# Patient Record
Sex: Male | Born: 1960 | Race: Black or African American | Hispanic: No | Marital: Married | Smoking: Former smoker
Health system: Southern US, Community
[De-identification: ages and names within clinical notes are randomized; demographics above are authoritative.]

## PROBLEM LIST (undated history)

## (undated) DIAGNOSIS — Z5189 Encounter for other specified aftercare: Secondary | ICD-10-CM

## (undated) DIAGNOSIS — D649 Anemia, unspecified: Secondary | ICD-10-CM

## (undated) DIAGNOSIS — I10 Essential (primary) hypertension: Secondary | ICD-10-CM

## (undated) DIAGNOSIS — G709 Myoneural disorder, unspecified: Secondary | ICD-10-CM

## (undated) DIAGNOSIS — A048 Other specified bacterial intestinal infections: Secondary | ICD-10-CM

## (undated) DIAGNOSIS — IMO0001 Reserved for inherently not codable concepts without codable children: Secondary | ICD-10-CM

## (undated) DIAGNOSIS — C884 Extranodal marginal zone B-cell lymphoma of mucosa-associated lymphoid tissue [MALT-lymphoma]: Secondary | ICD-10-CM

## (undated) DIAGNOSIS — K254 Chronic or unspecified gastric ulcer with hemorrhage: Secondary | ICD-10-CM

## (undated) HISTORY — PX: UPPER GASTROINTESTINAL ENDOSCOPY: SHX188

## (undated) HISTORY — DX: Anemia, unspecified: D64.9

## (undated) HISTORY — DX: Myoneural disorder, unspecified: G70.9

## (undated) HISTORY — DX: Chronic or unspecified gastric ulcer with hemorrhage: K25.4

## (undated) HISTORY — DX: Reserved for inherently not codable concepts without codable children: IMO0001

## (undated) HISTORY — DX: Encounter for other specified aftercare: Z51.89

## (undated) HISTORY — DX: Extranodal marginal zone B-cell lymphoma of mucosa-associated lymphoid tissue (MALT-lymphoma): C88.4

---

## 2003-11-07 ENCOUNTER — Emergency Department (HOSPITAL_COMMUNITY): Admission: EM | Admit: 2003-11-07 | Discharge: 2003-11-07 | Payer: Self-pay | Admitting: Family Medicine

## 2004-02-05 DIAGNOSIS — A048 Other specified bacterial intestinal infections: Secondary | ICD-10-CM

## 2004-02-05 DIAGNOSIS — C884 Extranodal marginal zone b-cell lymphoma of mucosa-associated lymphoid tissue (malt-lymphoma) not having achieved remission: Secondary | ICD-10-CM

## 2004-02-05 HISTORY — DX: Extranodal marginal zone b-cell lymphoma of mucosa-associated lymphoid tissue (malt-lymphoma) not having achieved remission: C88.40

## 2004-02-05 HISTORY — DX: Extranodal marginal zone B-cell lymphoma of mucosa-associated lymphoid tissue (MALT-lymphoma): C88.4

## 2004-02-05 HISTORY — DX: Other specified bacterial intestinal infections: A04.8

## 2005-02-02 ENCOUNTER — Inpatient Hospital Stay (HOSPITAL_COMMUNITY): Admission: EM | Admit: 2005-02-02 | Discharge: 2005-02-04 | Payer: Self-pay | Admitting: Emergency Medicine

## 2005-02-03 ENCOUNTER — Encounter (INDEPENDENT_AMBULATORY_CARE_PROVIDER_SITE_OTHER): Payer: Self-pay | Admitting: *Deleted

## 2005-02-03 ENCOUNTER — Ambulatory Visit: Payer: Self-pay | Admitting: Internal Medicine

## 2005-02-13 ENCOUNTER — Ambulatory Visit: Payer: Self-pay | Admitting: Hematology and Oncology

## 2005-02-13 ENCOUNTER — Ambulatory Visit: Payer: Self-pay | Admitting: Internal Medicine

## 2005-02-18 ENCOUNTER — Ambulatory Visit: Payer: Self-pay | Admitting: Cardiology

## 2005-03-12 ENCOUNTER — Ambulatory Visit: Payer: Self-pay | Admitting: Internal Medicine

## 2005-04-11 ENCOUNTER — Ambulatory Visit: Payer: Self-pay | Admitting: Hematology and Oncology

## 2006-01-15 ENCOUNTER — Ambulatory Visit: Payer: Self-pay | Admitting: Sports Medicine

## 2007-02-04 ENCOUNTER — Emergency Department (HOSPITAL_COMMUNITY): Admission: EM | Admit: 2007-02-04 | Discharge: 2007-02-04 | Payer: Self-pay | Admitting: Family Medicine

## 2008-06-17 ENCOUNTER — Emergency Department (HOSPITAL_COMMUNITY): Admission: EM | Admit: 2008-06-17 | Discharge: 2008-06-17 | Payer: Self-pay | Admitting: Emergency Medicine

## 2010-06-22 NOTE — Consult Note (Signed)
NAME:  Timothy Newton, Timothy Newton NO.:  [id number]   MEDICAL RECORD NO.:  [id number]          PATIENT TYPE:  INP   LOCATION:  5733                         FACILITY:  [facility]   PHYSICIAN:  Iva Boop, M.D. LHCDATE OF BIRTH:  [birth year]   DATE OF CONSULTATION:  02/02/2005  DATE OF DISCHARGE:                                   CONSULTATION   CHIEF COMPLAINT:  Melena.   REQUESTING PHYSICIAN:  Jetty Duhamel, M.D.   ASSESSMENT:  Melena consistent with upper GI bleed.  BUN and creatinine  ratio is elevated as high.  Hemoglobin is 9.3.  He has an acute blood loss  anemia.  His gastrointestinal bleeding is associated with orthostasis as  well, most likely peptic ulcer disease, although he is otherwise  asymptomatic.  AVMs or gastrointestinal neoplasm are possibilities.  He has  not had any other symptoms.   RECOMMENDATIONS/PLAN:  1.  Proceed with upper GI endoscopy tomorrow.  2.  Agree with proton pump inhibitor.  3.  It is reasonable to transfuse this patient even though his hemoglobin is      9.0 since he was orthostatic.  I suspect it will drop further.   I have explained the risk, benefits, and indications of upper GI endoscopy,  and he understands and agrees to proceed.   HISTORY:  This is a 49 year old African-American man with no significant  past medical history other than smoking.  Three to four days ago, he  developed melena with tarry-loose stools.  This persisted with a stool  everyday, and then today, he had one and felt weak, lightheaded and dizzy.  He had been drinking some mimosa.  His appetite is fine.  No weight loss.  No bowel habit changes.  There is no family history of colon cancer or other  contributing factors, including no NSAIDs.  He takes a multivitamin daily.   DRUG ALLERGIES:  None known.   FAMILY HISTORY:  Parents are both healthy.  No ulcer disease or colon  cancer.   SOCIAL HISTORY:  Social alcohol.  He does smoke one pack per day for  20  years plus.  He has been employed in [employer], currently unemployed.  He is  single.  He has a 90-year-old and a 17-year-old son that live with him.   REVIEW OF SYSTEMS:  Positive for eye glasses.  All other systems are  negative at this time.   PHYSICAL EXAMINATION:  GENERAL:  Reveals a pleasant, well-developed, middle-  aged black man, in no acute distress.  VITAL SIGNS:  Temperature 99, blood pressure 137/82, pulse 104-148, heart  rate 70-140 from supine to standing, respirations 18.  HEENT:  Eyes anicteric.  Mouth free of lesions.  NECK:  Supple, no mass.  CHEST:  Clear.  HEART:  S1 and S2.  No gallops.  ABDOMEN:  Soft, bowel sounds present, but not increased, nontender.  RECTAL EXAM:  Not repeated, having been done in the emergency room  confirming melena, per Dr. Vassie Moment history that I obtained from him.  EXTREMITIES:  No edema.  NEUROLOGIC:  He is alert and oriented x3.  LYMPH NODES:  No neck, supraclavicular, inguinal adenopathy.   LABORATORY DATA:  Hemoglobin 9.3, MVC 89, hematocrit 27, white count 11.6,  platelets 200,000.  Coagulations are normal with INR 1.2, PTT 27.  LFTs  normal.  BUN 24, creatinine 0.9, glucose 103, otherwise normal BMT.   I appreciate the opportunity to care for this patient.      Iva Boop, M.D. [facility]  Electronically Signed     CEG/MEDQ  D:  02/02/2005  T:  02/04/2005  Job:  [id number]   cc:   Lonia Blood, M.D.  Fax: [phone]

## 2010-06-22 NOTE — Discharge Summary (Signed)
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  DATE OF BIRTH:  03-02-1960   DATE OF ADMISSION:  02/02/2005  DATE OF DISCHARGE:  02/04/2005                                 DISCHARGE SUMMARY   DISCHARGE DIAGNOSES:  1.  Upper gastrointestinal bleed secondary to multiple gastric ulcers.  2.  Acute post hemorrhagic anemia requiring transfusion of 4 units packed      red blood cells.   DISCHARGE MEDICATIONS:  Protonix 40 mg p.o. b.i.d. for 2 weeks, then  Prilosec OTC 20 mg p.o. b.i.d. until seen by GI.   PROCEDURES:  1.  Upper GI endoscopy dated February 03, 2005, performed by Dr. Iva Boop, M.D., gastroenterologist.  This showed ulcer in posterior      stomach and body, also flat dark spots, not bleeding. Surrounding      mucosal elevation and thickening, firm with a suspicion of a neoplasm.  2.  Clean-based, benign appearing pyloric ulcer.  3.  Small sliding hiatal hernia.  Biopsies taken.   ADMISSION HISTORY:  Refer to H&P notes of February 02, 2005.  However, in  brief, this is a 50 year old male, with no significant past medical history,  who presents with passage of black tarry stools for 3 days, associated with  mild lightheadedness without any abdominal pain or hematemesis.  He was  admitted for further evaluation, investigation and management.   CLINICAL COURSE:  Upper GI bleed.  The patient presents with melena as well  as lightheadedness, was found to have a hemoglobin of 9.3 with an MCV of 90.  He was kept n.p.o., started on intravenous fluid hydration. Because he had a  tachycardia of 140-148, suggestive of hemodynamic instability, was  transfused 2 units of PRBCs.  Gastroenterology consultation was called,  which was kindly provided by Dr. Stan Head.  On February 03, 2005, repeat  hemoglobin was found to be 8.9.  The patient was therefore transfused with a  further 2 units of PRBCs, resulting in a post transfusion hemoglobin of 10.4  on February 04, 2005.  Patient underwent upper GI endoscopy performed by Dr.  Stan Head on February 03, 2005.  This showed an ulcer in the posterior  stomach and the body.  There were flat dark spots that were not bleeding and  also surrounding mucosal, elevation and thickening. Biopsies were taken.  There was also a clean-based, benign appearing, pyloric ulcer and a small  hiatal hernia.  At the time of this dictation, the pathology reports are  still pending.  As of a.m. of February 04, 2005, patient was totally  asymptomatic, hemodynamically stable, with a blood pressure of 123/61 mmHg,  and pulse rate of 76.  He had no acute symptoms overnight, he was  asymptomatic today and was keen to go.  Physical examination was quite  unremarkable.   DISPOSITION:  Patient was discharged in satisfactory condition on February 04, 2005.  He is recommended  to return to work on February 11, 2005.   DIET:  No restrictions.   ACTIVITY:  No restrictions.   WOUND CARE:  Not applicable.   MEDICATIONS:  Protonix 40 mg p.o. t.i.d. for 2 weeks and then Prilosec OTC  20 mg p.o. b.i.d.  Alternatively, he may continue Protonix 40 mg p.o. daily  after the initial course of 2 weeks, whichever is more financially suitable  for him.  He is recommended to continue on proton pump inhibitor treatment  until seen by Dr. Stan Head in 2-3 weeks.   FOLLOWUP INSTRUCTIONS:  Followup with Dr. Stan Head, GI, in 2-3 weeks.  Patient is to call for an appointment.  He has been supplied with the  appropriate telephone number, i.e. [phone].  He is also to followup  routinely with his P.M.D., Dr. Barbee Shropshire, in 1-2 weeks essentially for  followup of his hemoglobin level.   SPECIAL INSTRUCTIONS:  Patient has been instructed to avoid aspirin and  other NSAIDs.  He will  likely have followup endoscopy in 6-8 weeks.  All of  this has been communicated to patient, he has verbalized understanding.      Isidor Holts, M.D.  Electronically Signed     CO/MEDQ  D:  02/04/2005  T:  02/04/2005  Job:  [id number]   cc:   Iva Boop, M.D. [facility]  [address] [place], [place] [address]   Olene Craven, M.D.  Fax: [phone]

## 2010-06-22 NOTE — H&P (Signed)
NAME:  Timothy Newton, Timothy Newton NO.:  [id number]   MEDICAL RECORD NO.:  [id number]          PATIENT TYPE:  EMS   LOCATION:  MAJO                         FACILITY:  [facility]   PHYSICIAN:  Lonia Blood, M.D.DATE OF BIRTH:  [birth year]   DATE OF ADMISSION:  02/02/2005  DATE OF DISCHARGE:                                HISTORY & PHYSICAL   PRIMARY CARE PHYSICIAN:  Dr. Kern Reap.   CHIEF COMPLAINT:  Black tarry stool x3 days.   HISTORY OF PRESENT ILLNESS:  Timothy Newton is a very pleasant 50-  year-old gentleman with no significant medical history whatsoever. He has  been in his usual state of health until approximately 3 days ago when he had  one episode of black stool. He reported that it was primarily liquid runny  type stool. There was no accompanying abdominal or epigastric pain. The  patient had not had any recent change in his diet. The patient went the  remainder of the day without any symptoms. The following day he had an  additional black runny stool. Again no GI discomfort was suffered and no  further bowel movements were experienced. Today he had a fourth bowel  movement which was more formed. He began to develop significant nausea. He  noted that when he was in a sitting position and went to a standing position  he would break out in a sweat and he felt somewhat light headed. He did not  pass out. He has not had any hematemesis. There has been no vomiting  whatsoever. There continues to be a lack of abdominal pain. The patient has  had no prior history of similar complaints. He does not drink alcohol to  excess. He has had a mild headache today. There is no chest pain. There is  no shortness of breath. There has been no recent abdominal trauma. Appetite  has been normal up until the last 24 to 48 hours.   REVIEW OF SYSTEMS:  Comprehensive review of systems is unremarkable with  exception of history of present illness above.   PAST MEDICAL  HISTORY:  Tobacco abuse one pack per day since 50 years old.   MEDICATIONS:  No prescription medications. Denies use of nonsteroidals.   ALLERGIES:  No known drug allergies.   FAMILY HISTORY:  Mother is alive and healthy. Father is alive and healthy.  There is no history of GI cancer in the family.   SOCIAL HISTORY:  The patient does not drink alcohol to excess but does  occasionally partake of alcoholic beverages. He lives in [place]. He is  single. He has two healthy children. He is unemployed at present.   DATA REVIEWED:  Hemoglobin is 9.3 with an MCV of 90, white count is mildly  elevated at 11.6 thousand, platelet count is 200,000. Electrolytes are  balanced. BUN is 24 which is elevated, creatinine is 0.9. Serum glucose is  normal. Liver function tests are normal. Albumin is 3.5.   PHYSICAL EXAMINATION:  VITAL SIGNS:  Temperature 99.0, blood pressure  137/82, heart rate 104 to 148, respiratory rate 92% on room air. Heart rate  - orthostatic at 90  sitting and 140 standing.  GENERAL:  Well-developed, well-nourished male in no acute respiratory  distress.  HEENT:  Normocephalic, atraumatic. Pupils are equal, round and reactive to  light and accommodation. Extraocular muscles are intact bilaterally. OC/OP  clear.  NECK:  No JVD, no lymphadenopathy.  LUNGS:  Clear to auscultation bilaterally without wheezes or rhonchi.  CARDIOVASCULAR:  Regular rhythm but tachycardic at a approximately 110 beats  per minute without murmur, rub, or gallop.  ABDOMEN:  Nontender, nondistended, soft, bowel sounds present. No rebound,  no hepatosplenomegaly, no rebound, no ascites - abdominal exam is  unremarkable.  EXTREMITIES:  No significant clubbing, cyanosis or edema of bilateral lower  extremities.  NEUROLOGIC:  Neurologic exam is nonfocal.   IMPRESSION AND PLAN:  1.  Acute - subacute upper GI bleeding - symptoms are certainly most      consistent with upper GI bleed. This appears to be a  significant bleed      given the hemoglobin of 9.3 in an otherwise healthy male. MCV is normal      and therefore there is no suspicion of low baseline hemoglobin such as      with a chronic iron-deficiency anemia or a thalassemia.  The patient will be typed and crossed as detailed below. I will place him on  a proton pump inhibitor with 40 mg of Protonix IV q.12 hours. He has been  consulted to undergo  EGD.  1.  Acute blood loss anemia - the patient is suffering with significant      blood loss with a hemoglobin of 9.3. He has been typed and crossed and      will be transfused 2 units of packed red blood cells initially. I will      follow CBC q.6 hours and transfused further as necessary. Though on      would expect this patient to tolerate a hemoglobin at 9.3, I feel that      he is likely continuing to lose blood because of his orthostatic      symptoms and therefore I feel that transfusion is required. My goal is      to transfuse to approximately 10, though if he stabilizes between 8 and      10 without orthostatic symptoms then I feel that we can hold on further      transfusions. I am checking coags too as these have not been previously      checked.  2.  Tobacco abuse - I have advised the patient that he should discontinue      smoking. I have requested tobacco cessation consultation to support this      during his stay.      Lonia Blood, M.D.  Electronically Signed     JTM/MEDQ  D:  02/02/2005  T:  02/02/2005  Job:  [id number]   cc:   Olene Craven, M.D.  Fax: [phone]

## 2010-11-09 LAB — I-STAT 8, (EC8 V) (CONVERTED LAB)
Acid-Base Excess: 4 — ABNORMAL HIGH
BUN: 12
Bicarbonate: 31.7 — ABNORMAL HIGH
Chloride: 104
Glucose, Bld: 101 — ABNORMAL HIGH
HCT: 53 — ABNORMAL HIGH
Hemoglobin: 18 — ABNORMAL HIGH
Operator id: 235561
Potassium: 4.3
Sodium: 139
TCO2: 33
pCO2, Ven: 54.6 — ABNORMAL HIGH
pH, Ven: 7.371 — ABNORMAL HIGH

## 2010-11-09 LAB — POCT I-STAT CREATININE
Creatinine, Ser: 1.1
Operator id: 235561

## 2010-11-09 LAB — POCT URINALYSIS DIP (DEVICE)
Glucose, UA: NEGATIVE
Hgb urine dipstick: NEGATIVE
Ketones, ur: NEGATIVE
Nitrite: NEGATIVE
Operator id: 235561
Protein, ur: 100 — AB
Specific Gravity, Urine: 1.015
Urobilinogen, UA: 4 — ABNORMAL HIGH
pH: 8.5 — ABNORMAL HIGH

## 2011-03-17 ENCOUNTER — Encounter (HOSPITAL_COMMUNITY): Payer: Self-pay

## 2011-03-17 ENCOUNTER — Emergency Department (INDEPENDENT_AMBULATORY_CARE_PROVIDER_SITE_OTHER)
Admission: EM | Admit: 2011-03-17 | Discharge: 2011-03-17 | Disposition: A | Discharge status: Other Acute Inpatient Hospital | Payer: BC Managed Care – PPO | Source: Home / Self Care

## 2011-03-17 ENCOUNTER — Emergency Department (HOSPITAL_COMMUNITY)
Admission: EM | Admit: 2011-03-17 | Discharge: 2011-03-17 | Disposition: A | Discharge status: Home / Self Care | Payer: BC Managed Care – PPO | Attending: Emergency Medicine | Admitting: Emergency Medicine

## 2011-03-17 DIAGNOSIS — K922 Gastrointestinal hemorrhage, unspecified: Secondary | ICD-10-CM

## 2011-03-17 DIAGNOSIS — K921 Melena: Secondary | ICD-10-CM

## 2011-03-17 DIAGNOSIS — I10 Essential (primary) hypertension: Secondary | ICD-10-CM | POA: Insufficient documentation

## 2011-03-17 HISTORY — DX: Other specified bacterial intestinal infections: A04.8

## 2011-03-17 HISTORY — DX: Essential (primary) hypertension: I10

## 2011-03-17 LAB — COMPREHENSIVE METABOLIC PANEL
ALT: 15 U/L (ref 0–53)
AST: 13 U/L (ref 0–37)
Albumin: 3.7 g/dL (ref 3.5–5.2)
Alkaline Phosphatase: 53 U/L (ref 39–117)
BUN: 30 mg/dL — ABNORMAL HIGH (ref 6–23)
CO2: 29 mEq/L (ref 19–32)
Calcium: 10.9 mg/dL — ABNORMAL HIGH (ref 8.4–10.5)
Chloride: 103 mEq/L (ref 96–112)
Creatinine, Ser: 0.85 mg/dL (ref 0.50–1.35)
GFR calc Af Amer: >90 mL/min (ref >90)
GFR calc non Af Amer: >90 mL/min (ref >90)
Glucose, Bld: 113 mg/dL — ABNORMAL HIGH (ref 70–99)
Potassium: 4.7 mEq/L (ref 3.5–5.1)
Sodium: 138 mEq/L (ref 135–145)
Total Bilirubin: 1 mg/dL (ref 0.3–1.2)
Total Protein: 6.9 g/dL (ref 6.0–8.3)

## 2011-03-17 LAB — DIFFERENTIAL
Basophils Absolute: 0 10*3/uL (ref 0.0–0.1)
Basophils Relative: 0 % (ref 0–1)
Eosinophils Absolute: 0 10*3/uL (ref 0.0–0.7)
Eosinophils Relative: 0 % (ref 0–5)
Lymphocytes Relative: 19 % (ref 12–46)
Lymphs Abs: 1.7 10*3/uL (ref 0.7–4.0)
Monocytes Absolute: 0.3 10*3/uL (ref 0.1–1.0)
Monocytes Relative: 3 % (ref 3–12)
Neutro Abs: 7 10*3/uL (ref 1.7–7.7)
Neutrophils Relative %: 77 % (ref 43–77)

## 2011-03-17 LAB — CBC
HCT: 38.7 % — ABNORMAL LOW (ref 39.0–52.0)
Hemoglobin: 12.7 g/dL — ABNORMAL LOW (ref 13.0–17.0)
MCH: 30.2 pg (ref 26.0–34.0)
MCHC: 32.8 g/dL (ref 30.0–36.0)
MCV: 92.1 fL (ref 78.0–100.0)
Platelets: 218 10*3/uL (ref 150–400)
RBC: 4.2 MIL/uL — ABNORMAL LOW (ref 4.22–5.81)
RDW: 12.1 % (ref 11.5–15.5)
WBC: 9.1 10*3/uL (ref 4.0–10.5)

## 2011-03-17 LAB — PROTIME-INR
INR: 1.11 (ref 0.00–1.49)
Prothrombin Time: 14.5 seconds (ref 11.6–15.2)

## 2011-03-17 LAB — APTT: aPTT: 27 seconds (ref 24–37)

## 2011-03-17 LAB — OCCULT BLOOD, POC DEVICE: Fecal Occult Bld: POSITIVE

## 2011-03-17 MED ORDER — LANSOPRAZOLE 15 MG PO CPDR: 15.0000 mg | DELAYED_RELEASE_CAPSULE | Freq: Every day | ORAL | Status: DC

## 2011-03-17 NOTE — ED Notes (Signed)
Called security for pt to get transportation back to urgent care to receive his car

## 2011-03-17 NOTE — ED Notes (Signed)
Pt states he woke at 3am today and had diarrhea which was black, he has since had two more black stools today.  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

## 2011-03-17 NOTE — ED Notes (Signed)
Pt reports that in 2007 he was diagnosed with a bleeding ulcer.  Pt reports at that time he had bright red blood in stools.

## 2011-03-17 NOTE — ED Provider Notes (Signed)
Medical screening examination/treatment/procedure(s) were performed by non-physician practitioner and as supervising physician I was immediately available for consultation/collaboration.   [facility]; MD   Sharin Grave, MD 03/17/11 1755

## 2011-03-17 NOTE — ED Notes (Signed)
Reports black tarry stool onset this am at 0300 sts checked at [facility] and was bright red. sts loose stools

## 2011-03-17 NOTE — ED Provider Notes (Addendum)
History     CSN: [id number]  Arrival date & time 03/17/11  1316   First MD Initiated Contact with Patient 03/17/11 1437      Chief Complaint  Patient presents with  . Rectal Bleeding    (Consider location/radiation/quality/duration/timing/severity/associated sxs/prior treatment) Patient is a 51 y.o. male presenting with hematochezia. The history is provided by the patient.  Rectal Bleeding  Pertinent negatives include no abdominal pain, no diarrhea, no nausea, no vomiting, no chest pain, no headaches and no rash.   patient's had 3 episodes of black stool which is more like diarrhea since 3 AM this morning. He states he feels good otherwise. No other bleeding. No dizziness. No abdominal pain. 8 years ago he had similar symptoms and was diagnosed with an ulcer. He was H. pylori positive and had a "mild cancer". He states he followed up but nothing came of it. No weight loss. He does not smoke. No other bleeding. 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 He was seen in urgent care and had a guaiac test done that was positive. He was sent here for further evaluation  Past Medical History  Diagnosis Date  . Hypertension   . H. pylori infection   . Bleeding ulcer     History reviewed. No pertinent past surgical history.  History reviewed. No pertinent family history.  History  Substance Use Topics  . Smoking status: Never Smoker   . Smokeless tobacco: Not on file  . Alcohol Use: No      Review of Systems  Constitutional: Negative for activity change and appetite change.  HENT: Negative for neck stiffness.   Eyes: Negative for pain.  Respiratory: Negative for chest tightness and shortness of breath.   Cardiovascular: Negative for chest pain and leg swelling.  Gastrointestinal: Positive for blood in stool and hematochezia. Negative for nausea, vomiting, abdominal pain and diarrhea.  Genitourinary: Negative for flank pain.  Musculoskeletal:  Negative for back pain.  Skin: Negative for rash.  Neurological: Negative for weakness, numbness and headaches.  Psychiatric/Behavioral: Negative for behavioral problems.    Allergies  Review of patient's allergies indicates no known allergies.  Home Medications   Current Outpatient Rx  Name Route Sig Dispense Refill  . LISINOPRIL 10 MG PO TABS Oral Take 10 mg by mouth daily.    Carma Leaven M PLUS PO TABS Oral Take 1 tablet by mouth daily.    Marland Kitchen LANSOPRAZOLE 15 MG PO CPDR Oral Take 1 capsule (15 mg total) by mouth daily. 14 capsule 0    BP 127/68  Pulse 105  Temp(Src) 98.4 F (36.9 C) (Oral)  Resp 20  SpO2 96%  Physical Exam  Nursing note and vitals reviewed. Constitutional: He is oriented to person, place, and time. He appears well-developed and well-nourished.  HENT:  Head: Normocephalic and atraumatic.  Eyes: EOM are normal. Pupils are equal, round, and reactive to light.  Neck: Normal range of motion. Neck supple.  Cardiovascular: Normal rate, regular rhythm and normal heart sounds.   No murmur heard. Pulmonary/Chest: Effort normal and breath sounds normal.  Abdominal: Soft. Bowel sounds are normal. He exhibits no distension and no mass. There is no tenderness. There is no rebound and no guarding.  Musculoskeletal: Normal range of motion. He exhibits no edema.  Neurological: He is alert and oriented to person, place, and time. No cranial nerve deficit.  Skin: Skin is warm and dry. No rash noted. No pallor.  Psychiatric:  He has a normal mood and affect.    ED Course  Procedures (including critical care time)  Labs Reviewed  CBC - Abnormal; Notable for the following:    RBC 4.20 (*)    Hemoglobin 12.7 (*)    HCT 38.7 (*)    All other components within normal limits  COMPREHENSIVE METABOLIC PANEL - Abnormal; Notable for the following:    Glucose, Bld 113 (*)    BUN 30 (*)    Calcium 10.9 (*)    All other components within normal limits  DIFFERENTIAL  PROTIME-INR    APTT   No results found.   1. GI bleed       MDM  GI bleed. History same in 2007. Head the bleeding ulcer at that time. Initial hemoglobin is slightly below normal. Patient states he feels good.his initial hemoglobin is slightly low. He is not willing to stay for recheck. Is not hypotensive. He states will call Dr. Elenore Paddy tomorrow and will return tonight if he feels worse     Juliet Rude. Rubin Payor, MD 03/17/11 1544  Juliet Rude. Rubin Payor, MD 03/17/11 [phone]

## 2011-03-17 NOTE — ED Provider Notes (Signed)
History     CSN: [id number]  Arrival date & time 03/17/11  1057   None     Chief Complaint  Patient presents with  . Melena    (Consider location/radiation/quality/duration/timing/severity/associated sxs/prior treatment) HPI Comments: Timothy Newton presents stating that he awoke at 3 AM this morning needing to have a bowel movement. He had a loose black stool. He went back to sleep and awoke again at 6 or 7 AM and again had another loose black stool. About 10 AM he at breakfast and about 15 minutes later had a third loose black stool. He denies any abdominal pain. Until this morning his appetite and bowels have been normal. 02/03/2005 he was admitted with a GI bleed. At that time he states he had sharp abdominal pain and bright red rectal bleeding. He had an EGD performed and states he was told he had H Pylori infection and mild cancer but "not cancer." He states he was treated "with the purple pill" and had no further follow up.    Past Medical History  Diagnosis Date  . Hypertension   . H. pylori infection     History reviewed. No pertinent past surgical history.  History reviewed. No pertinent family history.  History  Substance Use Topics  . Smoking status: Never Smoker   . Smokeless tobacco: Not on file  . Alcohol Use: No      Review of Systems  Constitutional: Negative for fever, chills and appetite change.  Gastrointestinal: Negative for nausea, vomiting, abdominal pain, diarrhea and constipation.    Allergies  Review of patient's allergies indicates no known allergies.  Home Medications   Current Outpatient Rx  Name Route Sig Dispense Refill  . PRESCRIPTION MEDICATION  B/p pill? And one a day vitamins      BP 111/64  Pulse 74  Temp(Src) 98.1 F (36.7 C) (Oral)  Resp 18  SpO2 100%  Physical Exam  Nursing note and vitals reviewed. Constitutional: He appears well-developed and well-nourished. No distress.  HENT:  Head: Normocephalic and atraumatic.    Right Ear: Tympanic membrane, external ear and ear canal normal.  Left Ear: Tympanic membrane, external ear and ear canal normal.  Nose: Nose normal.  Mouth/Throat: Uvula is midline, oropharynx is clear and moist and mucous membranes are normal. No oropharyngeal exudate, posterior oropharyngeal edema or posterior oropharyngeal erythema.  Neck: Neck supple.  Cardiovascular: Normal rate, regular rhythm and normal heart sounds.   Pulmonary/Chest: Effort normal and breath sounds normal. No respiratory distress.  Abdominal: Normal appearance and bowel sounds are normal. He exhibits no mass. There is no hepatosplenomegaly. There is no tenderness. There is no CVA tenderness.  Genitourinary: Rectal exam shows no external hemorrhoid, no fissure, no mass, no tenderness and anal tone normal. Guaiac positive stool.  Lymphadenopathy:    He has no cervical adenopathy.  Neurological: He is alert.  Skin: Skin is warm and dry.  Psychiatric: He has a normal mood and affect.    ED Course  Procedures (including critical care time)  Labs Reviewed - No data to display No results found.   1. Melena       MDM  Pt transferred to [facility] for further evaluation. Previous records reviewed. Hx of admission 02-04-11 for GI bleed.  Pathology report showed GI lymphoma. Pt was treated for his H Pylori infection and has not returned for f/u.         Melody Comas, [place] 03/17/11 [phone]

## 2011-03-18 ENCOUNTER — Telehealth: Payer: Self-pay | Admitting: Internal Medicine

## 2011-03-18 NOTE — Telephone Encounter (Signed)
Patient will come in and see Willette Cluster RNP tomorrow at 9:00.  His symptoms are unchanged from the ER.  He feels the same way he did when he left the ER yesterday.  He has black stools no frank bleeding.  He is asked to return to the ER if his symptoms worsen or new symptoms.

## 2011-03-19 ENCOUNTER — Encounter: Payer: Self-pay | Admitting: Nurse Practitioner

## 2011-03-19 ENCOUNTER — Other Ambulatory Visit (INDEPENDENT_AMBULATORY_CARE_PROVIDER_SITE_OTHER): Payer: BC Managed Care – PPO

## 2011-03-19 ENCOUNTER — Ambulatory Visit (INDEPENDENT_AMBULATORY_CARE_PROVIDER_SITE_OTHER): Payer: BC Managed Care – PPO | Admitting: Nurse Practitioner

## 2011-03-19 VITALS — BP 110/60 | HR 106 | Ht 73.0 in | Wt 242.6 lb

## 2011-03-19 DIAGNOSIS — K921 Melena: Secondary | ICD-10-CM

## 2011-03-19 LAB — COMPREHENSIVE METABOLIC PANEL
ALT: 16 U/L (ref 0–53)
AST: 17 U/L (ref 0–37)
Albumin: 3.6 g/dL (ref 3.5–5.2)
Alkaline Phosphatase: 43 U/L (ref 39–117)
BUN: 22 mg/dL (ref 6–23)
CO2: 28 mEq/L (ref 19–32)
Calcium: 9.1 mg/dL (ref 8.4–10.5)
Chloride: 103 mEq/L (ref 96–112)
Creatinine, Ser: 0.9 mg/dL (ref 0.4–1.5)
GFR: 116.66 mL/min (ref >60.00)
Glucose, Bld: 91 mg/dL (ref 70–99)
Potassium: 3.7 mEq/L (ref 3.5–5.1)
Sodium: 137 mEq/L (ref 135–145)
Total Bilirubin: 0.6 mg/dL (ref 0.3–1.2)
Total Protein: 6.3 g/dL (ref 6.0–8.3)

## 2011-03-19 LAB — CBC WITH DIFFERENTIAL/PLATELET
Basophils Absolute: 0.1 10*3/uL (ref 0.0–0.1)
Basophils Relative: 0.4 % (ref 0.0–3.0)
Eosinophils Absolute: 0.1 10*3/uL (ref 0.0–0.7)
Eosinophils Relative: 0.8 % (ref 0.0–5.0)
HCT: 26.2 % — ABNORMAL LOW (ref 39.0–52.0)
Hemoglobin: 8.8 g/dL — ABNORMAL LOW (ref 13.0–17.0)
Lymphocytes Relative: 27.7 % (ref 12.0–46.0)
Lymphs Abs: 3.3 10*3/uL (ref 0.7–4.0)
MCHC: 33.6 g/dL (ref 30.0–36.0)
MCV: 90.9 fl (ref 78.0–100.0)
Monocytes Absolute: 0.9 10*3/uL (ref 0.1–1.0)
Monocytes Relative: 7.3 % (ref 3.0–12.0)
Neutro Abs: 7.6 10*3/uL (ref 1.4–7.7)
Neutrophils Relative %: 63.8 % (ref 43.0–77.0)
Platelets: 206 10*3/uL (ref 150.0–400.0)
RBC: 2.88 Mil/uL — ABNORMAL LOW (ref 4.22–5.81)
RDW: 12.6 % (ref 11.5–14.6)
WBC: 12 10*3/uL — ABNORMAL HIGH (ref 4.5–10.5)

## 2011-03-19 NOTE — Patient Instructions (Addendum)
We have scheduled the Endoscopy for Thursday 03-21-2011.   Directions provided.  We have schedueld the blood transfusion for tomorrow 03-20-2011 at [facility] Short Stay beside the ER department. Arrive at 8:00 AM. Be sure to have breakfast and drink water to stay hydrated before arriving.  Take the Prevacid twice daily.   We will fax the work note to your place of employment per your request.

## 2011-03-19 NOTE — Progress Notes (Signed)
03/19/2011 Timothy Newton [id number] 17-Apr-1960   HISTORY OF PRESENT ILLNESS: Patient is a 51 year old male known to Dr. 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 EGD at that time also showed a clean-based benign-appearing pyloric ulcer. His CTscan was negative for metastatic disease. Patient was started on a PPI, scheduled for repeat EGD with EUS and referred to oncology. I have oncology's initial consultation note but no follow up notes.  As far as I can tell patient required no treatment other than eradication of H. Pylori. He declined EUS and hasn't been seen here since. Two days ago patient began having black stools for which he was evaluated in ED. Hemoglobin in ED was 12.7,  white count was normal at 9.1, BUN normal at 22. Patient does not take any anti-inflammatories. He has no abdominal pain, nausea or weight loss. He does admit to feeling slightly dizzy. Heart rate now is 110.    Past Medical History  Diagnosis Date  . Hypertension   . H. pylori infection PUD Gastric MALT lymphoma 2006      reports that he quit smoking about 10 years ago. He has never used smokeless tobacco. He reports that he does not drink alcohol or use illicit drugs. family history is not on file. No Known Allergies    Outpatient Encounter Prescriptions as of 03/19/2011  Medication Sig Dispense Refill  . lansoprazole (PREVACID) 15 MG capsule Take 1 capsule (15 mg total) by mouth daily.  14 capsule  0  . lisinopril (PRINIVIL,ZESTRIL) 10 MG tablet Take 10 mg by mouth daily.      . Multiple Vitamins-Minerals (MULTIVITAMINS THER. W/MINERALS) TABS Take 1 tablet by mouth daily.         REVIEW OF SYSTEMS  : All other systems reviewed and negative except where noted in the History of Present Illness.  PHYSICAL EXAM: BP 110/60  Pulse 106  Ht 6\' 1"  (1.854 m)  Wt 110.043 kg (242 lb 9.6 oz)  BMI 32.01 kg/m2 General: Well developed black male in  no acute distress Head: Normocephalic and atraumatic Eyes:  sclerae anicteric,conjunctive pink. Ears: Normal auditory acuity Mouth: No deformity or lesions Neck: Supple, no masses.  Lungs: Respirations even, unlabored, Clear throughout to auscultation Heart: Slightly tachycardic at 110 no murmurs heard Abdomen: Soft, non distended, nontender. No masses or hepatomegaly noted. Normal Bowel sounds Rectal: liquid melenic stool in vault.  Musculoskeletal: Symmetrical with no gross deformities  Skin: No lesions on visible extremities Extremities: No edema or deformities noted Neurological: Alert oriented x 4, grossly nonfocal Cervical Nodes:  No significant cervical adenopathy Psychological:  Alert and cooperative. Normal mood and affect  ASSESSMENT AND PLAN;  105. 51 year old black male with history of gastric MALT lymphoma diagnosed late 2006. I do not have all of oncology's records but it appears patient did not require any treatment (other than H. pylori eradication) as the CT scan was negative for metastatic disease. Endoscopic ultrasound was recommended at that time, patient declined and has not been seen here since. See #2 2. Two day history of melena associated with dizziness. Hemoglobin in ED two days ago was 12.7. Patient still having melenic stools, suspect hemoglobin has significantly declined. Patient is a runner, his blood pressure is okay but suspect  heart rate of 110 represent significant blood loss .I recommended hospital admission for active bleeding but patient is reluctant as he is a single parent with young children. Will obtain stat labs and plan  for outpatient upper endoscopy as soon as possible.  If hemoglobin critically low, as I am concerned it will, patient will likely need a blood transfusion. We will start him on a proton pump inhibitor to be taken twice daily.   Addendum: Stat labs reviewed. Hemoglobin is 8.8, down from 12.7 two days ago, WBC is 12. We have arranged for  2 units of PRBC to be transfused tomorrow. He will have an EGD on Thursday with Dr. Rhea Belton (Dr. Marvell Fuller schedule is full). The benefits, risks, and potential complications of EGD with possible biopsies were discussed with the patient and he agrees to proceed. I strongly advised the patient to return to the ED should he have increasing dizziness or development of shortness of breath or chest pain.

## 2011-03-20 ENCOUNTER — Other Ambulatory Visit: Payer: BC Managed Care – PPO | Admitting: Internal Medicine

## 2011-03-20 ENCOUNTER — Encounter (HOSPITAL_COMMUNITY)
Admission: RE | Admit: 2011-03-20 | Discharge: 2011-03-20 | Disposition: A | Discharge status: Home / Self Care | Payer: BC Managed Care – PPO | Source: Ambulatory Visit | Attending: Internal Medicine | Admitting: Internal Medicine

## 2011-03-20 DIAGNOSIS — D649 Anemia, unspecified: Secondary | ICD-10-CM | POA: Insufficient documentation

## 2011-03-20 DIAGNOSIS — K921 Melena: Secondary | ICD-10-CM | POA: Insufficient documentation

## 2011-03-20 LAB — PREPARE RBC (CROSSMATCH)

## 2011-03-20 MED ORDER — ACETAMINOPHEN 325 MG PO TABS
650.0000 mg | ORAL_TABLET | Freq: Once | ORAL | Status: AC
  Administered 2011-03-20: 650 mg via ORAL
  Filled 2011-03-20: qty 2

## 2011-03-20 NOTE — Progress Notes (Signed)
1015 Blood turned up to 141ml/ht

## 2011-03-20 NOTE — Progress Notes (Signed)
Reviewed DB 

## 2011-03-21 ENCOUNTER — Other Ambulatory Visit: Payer: Self-pay

## 2011-03-21 ENCOUNTER — Encounter: Payer: Self-pay | Admitting: Internal Medicine

## 2011-03-21 ENCOUNTER — Ambulatory Visit (AMBULATORY_SURGERY_CENTER): Payer: BC Managed Care – PPO | Admitting: Internal Medicine

## 2011-03-21 DIAGNOSIS — K259 Gastric ulcer, unspecified as acute or chronic, without hemorrhage or perforation: Secondary | ICD-10-CM

## 2011-03-21 DIAGNOSIS — C8589 Other specified types of non-Hodgkin lymphoma, extranodal and solid organ sites: Secondary | ICD-10-CM

## 2011-03-21 DIAGNOSIS — K921 Melena: Secondary | ICD-10-CM

## 2011-03-21 DIAGNOSIS — K2960 Other gastritis without bleeding: Secondary | ICD-10-CM

## 2011-03-21 DIAGNOSIS — D649 Anemia, unspecified: Secondary | ICD-10-CM

## 2011-03-21 DIAGNOSIS — C884 Extranodal marginal zone B-cell lymphoma of mucosa-associated lymphoid tissue [MALT-lymphoma]: Secondary | ICD-10-CM

## 2011-03-21 LAB — TYPE AND SCREEN
ABO/RH(D): A POS
Antibody Screen: NEGATIVE
Unit division: 0
Unit division: 0

## 2011-03-21 MED ORDER — SODIUM CHLORIDE 0.9 % IV SOLN: 500.0000 mL | INTRAVENOUS | Status: DC

## 2011-03-21 MED ORDER — LANSOPRAZOLE 30 MG PO CPDR: 30.0000 mg | DELAYED_RELEASE_CAPSULE | Freq: Two times a day (BID) | ORAL | Status: DC

## 2011-03-21 NOTE — Op Note (Signed)
[facility] [address] [place], [place]  [address]  ENDOSCOPY PROCEDURE REPORT  PATIENT:  Timothy Newton, Timothy Newton  MR#:  [id number] BIRTHDATE:  1960/09/11, 50 yrs. old  GENDER:  male ENDOSCOPIST:  Carie Caddy. Malvern Kadlec, MD Referred by:  Iva Boop, M.D., [facility] PROCEDURE DATE:  03/21/2011 PROCEDURE:  EGD for control of bleeding, EGD with biopsy, 43239 ASA CLASS:  Class III INDICATIONS:  melena, anemia MEDICATIONS:   These medications were titrated to patient response per physician's verbal order, Versed 14 mg IV, Fentanyl 150 mcg IV TOPICAL ANESTHETIC:  Cetacaine Spray  DESCRIPTION OF PROCEDURE:   After the risks benefits and alternatives of the procedure were thoroughly explained, informed consent was obtained.  The LB GIF-H180 [id number] endoscope was introduced through the mouth and advanced to the second portion of the duodenum, without limitations.  The instrument was slowly withdrawn as the mucosa was fully examined. <<PROCEDUREIMAGES>>  The esophagus and gastroesophageal junction were completely normal in appearance.  An ulcer with visible vessel was found in the fundus. Epinephrine (1:10,000)  injection was performed around the ulcer, then 2 endoscopic clips were placed with success.  Nodular mucosa was found in the fundus, cardia and gastric body. Multiple biopsies were obtained including around the ulcer itself, and sent to pathology.  The duodenal bulb was normal in appearance, as was the postbulbar duodenum.    Retroflexed views revealed findings as previously described.    The scope was then withdrawn from the patient and the procedure completed.  COMPLICATIONS:  None  ENDOSCOPIC IMPRESSION: 1) Normal esophagus 2) Ulcer in the fundus.  Injected with epinephrine and 2 hemostatic clips placed with success. 3) Nodular mucosa in the fundus, cardia and gastric body. Multiple biopsies performed. 4) Normal duodenum  RECOMMENDATIONS: 1) Await pathology results 2)  Avoid NSAIDs 3) Increase lansoprazole to 30 mg twice daily. 4) Follow up with either Mrs. Wilmon Pali or Dr. Leone Payor next week for reassessment and Hgb check.  Carie Caddy. Rhea Belton, MD  CC:  Stan Head, MD Willette Cluster ACNP-BC The Patient  n. eSIGNED:   Carie Caddy. Shakayla Hickox at 03/21/2011 12:22 PM  Enriqueta Shutter, [phone]

## 2011-03-21 NOTE — Progress Notes (Signed)
Patient did not experience any of the following events: a burn prior to discharge; a fall within the facility; wrong site/side/patient/procedure/implant event; or a hospital transfer or hospital admission upon discharge from the facility. (G8907) Patient did not have preoperative order for IV antibiotic SSI prophylaxis. (G8918)  

## 2011-03-21 NOTE — Patient Instructions (Signed)
FOLLOW DISCHARGE INSTRUCTIONS (BLUE & GREEN SHEETS).   NO ASPIRIN PRODUCTS OR ANTI-INFLAMMATORY MEDICATIONS   INCREASE IANSOPRAZOLE TO 30 MG TWICE DAILY ( SENT TO YOUR PHARMACY )  RECHECK HGB ( BLOOD COUNT ) NEXT WEEK FOLLOWING UP WITH PAULA GUENTHER AND OR DR. Leone Payor.

## 2011-03-21 NOTE — Progress Notes (Signed)
Patient needs CBCD tomorrow and appt with Dr Leone Payor for next week per Dr Rhea Belton.  I have scheduled him for an appt with Dr Leone Payor for 03/26/11.  He is aware of both appts

## 2011-03-22 ENCOUNTER — Telehealth: Payer: Self-pay | Admitting: *Deleted

## 2011-03-22 ENCOUNTER — Encounter: Payer: Self-pay | Admitting: Internal Medicine

## 2011-03-22 ENCOUNTER — Other Ambulatory Visit (INDEPENDENT_AMBULATORY_CARE_PROVIDER_SITE_OTHER): Payer: BC Managed Care – PPO

## 2011-03-22 DIAGNOSIS — D649 Anemia, unspecified: Secondary | ICD-10-CM

## 2011-03-22 LAB — CBC WITH DIFFERENTIAL/PLATELET
Basophils Absolute: 0 10*3/uL (ref 0.0–0.1)
Eosinophils Absolute: 0.3 10*3/uL (ref 0.0–0.7)
Lymphocytes Relative: 20 % (ref 12.0–46.0)
Lymphs Abs: 2.3 10*3/uL (ref 0.7–4.0)
MCHC: 33.2 g/dL (ref 30.0–36.0)
Monocytes Relative: 5.9 % (ref 3.0–12.0)
Neutro Abs: 8.3 10*3/uL — ABNORMAL HIGH (ref 1.4–7.7)
Platelets: 230 10*3/uL (ref 150.0–400.0)
RDW: 13.2 % (ref 11.5–14.6)

## 2011-03-22 NOTE — Telephone Encounter (Signed)
  Follow up Call-  Call back number 03/21/2011  Post procedure Call Back phone  # [phone], CELL [phone]  Permission to leave phone message Yes     Patient questions:  Do you have a fever, pain , or abdominal swelling? no Pain Score  0 *  Have you tolerated food without any problems? yes  Have you been able to return to your normal activities? yes  Do you have any questions about your discharge instructions: Diet   no Medications  no Follow up visit  no  Do you have questions or concerns about your Care? no  Actions: * If pain score is 4 or above: No action needed, pain <4. PATIENT QUESTIONING WHETHER SOMEONE CALLED YESTERDAY REGARDING BLOOD DRAW TODAY. INFORMED NO DOCUMENTED TELEPHONE CALL, REFERRED PATIENT TO ENDO PRO REPORT.REVIEWED RECOMMENDATIONS WITH THE PATYIENT.

## 2011-03-22 NOTE — Telephone Encounter (Signed)
Called the patient and reminded him to please come to our lab in the basement level for bloodwork.  He said he thought someone called after his procedure but was a little sleepy and didn't write it down.  I also advised him of the appointment with Dr. Leone Payor on Tues 03-26-2011 at 10:15 AM.  He thanked me for calling.

## 2011-03-26 ENCOUNTER — Encounter: Payer: Self-pay | Admitting: Internal Medicine

## 2011-03-26 ENCOUNTER — Ambulatory Visit (INDEPENDENT_AMBULATORY_CARE_PROVIDER_SITE_OTHER): Payer: BC Managed Care – PPO | Admitting: Internal Medicine

## 2011-03-26 ENCOUNTER — Other Ambulatory Visit (INDEPENDENT_AMBULATORY_CARE_PROVIDER_SITE_OTHER): Payer: BC Managed Care – PPO

## 2011-03-26 VITALS — BP 120/80 | HR 70 | Ht 73.0 in | Wt 245.6 lb

## 2011-03-26 DIAGNOSIS — D62 Acute posthemorrhagic anemia: Secondary | ICD-10-CM

## 2011-03-26 DIAGNOSIS — K250 Acute gastric ulcer with hemorrhage: Secondary | ICD-10-CM

## 2011-03-26 LAB — CBC WITH DIFFERENTIAL/PLATELET
Basophils Absolute: 0 10*3/uL (ref 0.0–0.1)
Eosinophils Relative: 5.1 % — ABNORMAL HIGH (ref 0.0–5.0)
HCT: 30.3 % — ABNORMAL LOW (ref 39.0–52.0)
Hemoglobin: 9.9 g/dL — ABNORMAL LOW (ref 13.0–17.0)
Lymphocytes Relative: 20 % (ref 12.0–46.0)
Lymphs Abs: 1.7 10*3/uL (ref 0.7–4.0)
Monocytes Relative: 7.1 % (ref 3.0–12.0)
Platelets: 285 10*3/uL (ref 150.0–400.0)
RDW: 13.7 % (ref 11.5–14.6)
WBC: 8.3 10*3/uL (ref 4.5–10.5)

## 2011-03-26 MED ORDER — FERROUS SULFATE 325 (65 FE) MG PO TABS: 325.0000 mg | ORAL_TABLET | Freq: Two times a day (BID) | ORAL | Status: DC

## 2011-03-26 NOTE — Assessment & Plan Note (Signed)
Proximal gastric ulcer, biopsies pending. In the setting of prior MALT, lymphoma one wonders about the recurrence of this. He denies NSAID use. He is improved. No melena. Will reassess with hemoglobins and start ferrous sulfate. Will notify when the biopsy results are in. I explained that he will at least need another endoscopy to document healing.

## 2011-03-26 NOTE — Patient Instructions (Signed)
Please go to the basement upon leaving today to have your labs done. Please start Ferrous Sulfate (iron) 325 mg twice a day , you can get this over-the-counter. We will contact you with lab and pathology results.

## 2011-03-26 NOTE — Progress Notes (Signed)
  Subjective:    Patient ID: Timothy Newton, male    DOB: [birth year], 51 y.o.   MRN: [id number]  HPI The patient returns for followup after having melena, an upper GI bleed related to a proximal gastric ulcer. This was discovered on February 14. There were some bleeding stigmata so the ulcer was injected with epinephrine and clipped. Biopsies of that and nodular mucosa were taken, he has a history of a MALT lymphoma in 2006.  He reports no abdominal pain and he has not had melena in several days at least.  No Known Allergies Outpatient Prescriptions Prior to Visit  Medication Sig Dispense Refill  . lansoprazole (PREVACID) 30 MG capsule Take 1 capsule (30 mg total) by mouth 2 (two) times daily.  30 capsule  11  . lisinopril (PRINIVIL,ZESTRIL) 10 MG tablet Take 10 mg by mouth daily.      . Multiple Vitamins-Minerals (MULTIVITAMINS THER. W/MINERALS) TABS Take 1 tablet by mouth daily.                           Past Medical History  Diagnosis Date  . Hypertension   . H. pylori infection   . Bleeding gastric ulcer   . Blood transfusion   . Anemia   . MALT lymphoma   . Hiatal hernia    Past Surgical History  Procedure Date  . Upper gastrointestinal endoscopy    History   Social History  . Marital Status: Single    Spouse Name: N/A    Number of Children: 2  . Years of Education: N/A   Occupational History  .  [employer]   Social History Main Topics  . Smoking status: Former Smoker    Quit date: 03/18/2001  . Smokeless tobacco: Never Used  . Alcohol Use: No  . Drug Use: No    Family History  Problem Relation Age of Onset  . Colon cancer Neg Hx   . Diabetes Neg Hx   . Heart disease Neg Hx        Review of Systems As above.    Objective:   Physical Exam Although a well-nourished acute distress Abdomen is soft and nontender bowel sounds present       Assessment & Plan:

## 2011-03-26 NOTE — Assessment & Plan Note (Signed)
Improved after transfusion. Start ferrous sulfate 325 mg twice a day. Followup hemoglobin today.

## 2011-03-28 ENCOUNTER — Ambulatory Visit: Payer: BC Managed Care – PPO | Admitting: Internal Medicine

## 2011-03-28 ENCOUNTER — Encounter: Payer: Self-pay | Admitting: Internal Medicine

## 2011-03-28 ENCOUNTER — Other Ambulatory Visit: Payer: Self-pay

## 2011-03-28 DIAGNOSIS — C884 Extranodal marginal zone B-cell lymphoma of mucosa-associated lymphoid tissue [MALT-lymphoma]: Secondary | ICD-10-CM

## 2011-03-28 NOTE — Progress Notes (Signed)
Quick Note:  Office: Please let him know that Hgb was a little better but still low Stay on current Tx including PPI and iron  Pathology has returned and is inconclusive - I want him to go back to Dr. Dalene Carrow - the oncologist he saw in 2007 - please arrange re: history of suspected MALT lymphoma and recurrent ulcer with lymphoplasmacytic infiltrates  I can call him personally if he has other ?'s  Needs EGD recall for 2 months please - Note that Dr. Rhea Belton did the procedure but I am following up and will let [facility] know ______

## 2011-03-29 ENCOUNTER — Telehealth: Payer: Self-pay | Admitting: Hematology and Oncology

## 2011-03-29 NOTE — Telephone Encounter (Signed)
S/w pt re appt for 3/1 @ 9:30 am w/LO.

## 2011-04-05 ENCOUNTER — Encounter: Payer: Self-pay | Admitting: Hematology and Oncology

## 2011-04-05 ENCOUNTER — Ambulatory Visit: Payer: BC Managed Care – PPO

## 2011-04-05 ENCOUNTER — Ambulatory Visit (HOSPITAL_BASED_OUTPATIENT_CLINIC_OR_DEPARTMENT_OTHER): Payer: BC Managed Care – PPO | Admitting: Hematology and Oncology

## 2011-04-05 ENCOUNTER — Telehealth: Payer: Self-pay | Admitting: Hematology and Oncology

## 2011-04-05 ENCOUNTER — Telehealth: Payer: Self-pay | Admitting: Internal Medicine

## 2011-04-05 VITALS — BP 146/87 | HR 70 | Temp 98.8°F | Ht 75.0 in | Wt 250.1 lb

## 2011-04-05 DIAGNOSIS — C8589 Other specified types of non-Hodgkin lymphoma, extranodal and solid organ sites: Secondary | ICD-10-CM

## 2011-04-05 DIAGNOSIS — D539 Nutritional anemia, unspecified: Secondary | ICD-10-CM

## 2011-04-05 DIAGNOSIS — C884 Extranodal marginal zone B-cell lymphoma of mucosa-associated lymphoid tissue [MALT-lymphoma]: Secondary | ICD-10-CM

## 2011-04-05 LAB — FOLATE: Folate: 16.2 ng/mL

## 2011-04-05 LAB — COMPREHENSIVE METABOLIC PANEL
ALT: 13 U/L (ref 0–53)
BUN: 10 mg/dL (ref 6–23)
CO2: 27 mEq/L (ref 19–32)
Calcium: 9.4 mg/dL (ref 8.4–10.5)
Chloride: 104 mEq/L (ref 96–112)
Creatinine, Ser: 0.95 mg/dL (ref 0.50–1.35)
Glucose, Bld: 121 mg/dL — ABNORMAL HIGH (ref 70–99)
Total Bilirubin: 0.7 mg/dL (ref 0.3–1.2)

## 2011-04-05 LAB — VITAMIN B12: Vitamin B-12: 478 pg/mL (ref 211–911)

## 2011-04-05 LAB — CBC WITH DIFFERENTIAL/PLATELET
Basophils Absolute: 0.1 10*3/uL (ref 0.0–0.1)
Eosinophils Absolute: 0.7 10*3/uL — ABNORMAL HIGH (ref 0.0–0.5)
HCT: 35.6 % — ABNORMAL LOW (ref 38.4–49.9)
HGB: 11.4 g/dL — ABNORMAL LOW (ref 13.0–17.1)
LYMPH%: 22 % (ref 14.0–49.0)
MONO#: 0.5 10*3/uL (ref 0.1–0.9)
NEUT%: 61.1 % (ref 39.0–75.0)
Platelets: 248 10*3/uL (ref 140–400)
WBC: 7.4 10*3/uL (ref 4.0–10.3)
lymph#: 1.6 10*3/uL (ref 0.9–3.3)

## 2011-04-05 LAB — LACTATE DEHYDROGENASE: LDH: 141 U/L (ref 94–250)

## 2011-04-05 LAB — IRON AND TIBC: UIBC: 89 ug/dL — ABNORMAL LOW (ref 125–400)

## 2011-04-05 NOTE — Telephone Encounter (Signed)
Yes if ok with Dr. Rhea Belton  Let me know

## 2011-04-05 NOTE — Telephone Encounter (Signed)
Gave pt appt schedule for April and pet/ct for 3/14. Per pof pt to have endoscopy/colonoscopy w/dr gessner (2mos per referral). Per pt he wants to see Dr. Rhea Belton instead. S/w kristie @ [facility] and per [place] she will send a note back to both doctors to ok this and Dr. Lauro Franklin nurse will call back w/appt probably Monday due to Dr. Leone Payor is out this wk (hosp doc) and won't be back until Monday. Pt aware that he will be contacted w/GI appt. Above message sent to LO.

## 2011-04-05 NOTE — Telephone Encounter (Signed)
Dr. Pyrtle will you accept 

## 2011-04-05 NOTE — Patient Instructions (Signed)
Patient to follow up as instructed.  

## 2011-04-05 NOTE — Progress Notes (Signed)
CC:   Timothy Newton, M.D. Timothy Boop, MD,FACG Timothy Blinks, MD  REFERRING PHYSICIAN:  Iva Boop, MD,FACG  IDENTIFYING STATEMENT:  The patient is a 51 year old man seen at request of Dr. Leone Payor with a history of a gastric MALT lymphoma.  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  At time of oncology followup, a CT scan that showed no evidence of metastatic disease.  After his initial consultation, he declined further followup. He was treated with triple therapy with Nexium, amoxicillin and clarithromycin.  I am not quite sure if he has had followup with GI since then. However, on 03/17/2010 he presented with black, tarry stools. Hemoglobin and hematocrit on 03/19/2011 were 8.8 and 36.2, respectively. On 03/21/2011 Dr. Erick Newton performed an endoscopy which revealed a normal esophagus.  An ulcer with visible vessel was found in the fundus. The surrounding mucosa in the fundus, cardia and gastric body was nodular.  Multiple biopsies were obtained and sent to pathology.  The patient was placed on Prevacid and oral iron.  He denies further occurrence of bleeding.  His energy levels are fair.  He denies adenopathy but has noted a painless mass above his umbilicus for one year.   He has had no fever, chills or night sweats.  He notes his weight is stable.  Surgical pathology notes the stomach biopsies were composed of inflamed gastric body and antral mucosa with lymphoproliferation.  Immunohistochemical stains were performed and showed no evidence of lymphoepithelial lesions.  Kappa and lambda stains were inconclusive.  A Warthin-Starry stain for H pylori was negative.  In conclusion, there was no definite evidence of lymphoma, dysplasia or malignancy identified within the material.  PAST MEDICAL HISTORY: 1. Gastric MALT lymphoma, H pylori-positive, diagnosed in December     2007.  Patient lost to  followup. 2. Hypertension.  ALLERGIES:  None.  MEDICATIONS:  Ferrous sulfate OTC 1 tablet twice daily, Prevacid 30 mg daily, lisinopril 10 mg daily, multivitamin, vitamin C.  SOCIAL HISTORY:  The patient is widowed with 2 children, ages 60 and 39. He is a former smoker, gave up in 2007.  Drinks minimal alcohol.  He works at [employer].  FAMILY HISTORY:  Negative for oncologic or hematologic malignancies.  REVIEW OF SYSTEMS:  He currently denies fever, chills, night sweats, anorexia, weight loss.  Cardiovascular:  Denies chest pain, PND, orthopnea, ankle swelling.  Respirations:  Denies cough, hemoptysis, wheeze, shortness of breath.  GI:  Currently denies nausea, vomiting, abdominal pain, diarrhea, melena, hematochezia.  GU:  Denies dysuria, hematuria, nocturia.  Skin:  Denies rashes or nodules.  Musculoskeletal: Denies joint aches, muscle pains.  Neurologic:  Denies headaches, vision change, extremity weakness. Rest of review of symptoms was negative.  PHYSICAL EXAMINATION:  The patient is an alert and oriented x3.  Vitals: Pulse 70, blood pressure 146/87, temperature 98.8, respirations 20, weight 250 pounds.  HEENT:  Head is atraumatic, normocephalic.  Sclerae anicteric.  Mouth moist.  Neck:  Supple.  Chest:  Clear to percussion and auscultation.  CVS:  First and second heart sounds present.  No added sounds or murmurs.  Abdomen:  Soft, nontender.  A pin-size nontender mass was noted above umbilical measuring up 2 cm.  Bowel sounds present.  Extremities:  No calf tenderness.  Pulses present and symmetrical.  Lymph nodes:  No palpable cervical, axillary or inguinal adenopathy.  CNS:  Nonfocal.  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  He was managed with triple therapy.  CTs at that time had shown no evidence of metastasis.  He failed to  follow up but more recently was found to have a bleeding gastric ulcer at endoscopy, was also found to have a stomach with nodularity.  The pathology was nonconclusive for recurrence of a gastric mucosa-associated lymphoid tissue lymphoma and was Helicobacter pylori- negative.  So spent more than half the time with the patient in discussion.  He was told that at this point because there was no evidence of lymphoma, Dr Margretta Sidle had felt that the stomach was grossly nodular.  He had taken multiple biopsies and there was no evidence of lymphoma. Despite this, it would be reasonable for him to undergo repeat EGD with Dr. Drenda Freeze. Rhea Belton in 2 months' time.  He is to  continue with PPI.  He needs to be scanned. He will receive a PET scan and a CT of the abdomen and pelvis.  We will also obtain an LDH.  We will obtain iron studies and if his ferritin levels are low, we will have him return for IV iron in the form of Feraheme.  He follows up after his endoscopy or sooner if needed.  Patient agrees with the recommendations and questions were answered to his satisfaction.      ______________________________ Laurice Record, M.D. LIO/MEDQ  D:  04/05/2011  T:  04/05/2011  Job:  [id number]

## 2011-04-05 NOTE — Progress Notes (Signed)
This office note has been dictated.

## 2011-04-05 NOTE — Telephone Encounter (Signed)
Dr. Gessner do you approve? 

## 2011-04-08 ENCOUNTER — Telehealth: Payer: Self-pay | Admitting: *Deleted

## 2011-04-08 NOTE — Telephone Encounter (Signed)
Spoke with pt and instructed pt re:  Iron and Hgb levels improved.   Instructed pt to continue with oral iron for 1 month , then discontinue.  Instructed pt to also continue with PPI   As per  Dr. Lonell Face instructions.   Pt voiced understanding.

## 2011-04-09 ENCOUNTER — Encounter: Payer: Self-pay | Admitting: Internal Medicine

## 2011-04-09 NOTE — Telephone Encounter (Signed)
Ok to change to me

## 2011-04-10 ENCOUNTER — Telehealth: Payer: Self-pay | Admitting: *Deleted

## 2011-04-10 NOTE — Telephone Encounter (Signed)
lmom for pt to call back to schedule a repeat EGD with COLON in 8 weeks.

## 2011-04-10 NOTE — Telephone Encounter (Signed)
Letter from: Beverley Fiedler   Reason for Letter: Results Review   Comments: repeat EGD in about 8 weeks   8 week repeat EGD with colon, 1 hour slot with propofol.   Aram Beecham, this pt is transferring to me from [place]

## 2011-04-10 NOTE — Telephone Encounter (Signed)
Spoke with pt who prefers we call on his home number!! Informed him we need to schedule him in April for [facility]; I will need to call him when the schedule comes out. Pt requests a Thursday appt, even though he will be prepping on Wednesday, because he has Thursdays and Fridays off, and he wants to rest on Friday. Pt stated it's ok to go ahead and schedule the appt and let him know. He wanted the 1st appt of the day, but I informed him of the 2 day prep and he will take midmorning. Reminder in.

## 2011-04-18 ENCOUNTER — Other Ambulatory Visit (HOSPITAL_COMMUNITY): Payer: BC Managed Care – PPO

## 2011-04-18 ENCOUNTER — Telehealth: Payer: Self-pay | Admitting: *Deleted

## 2011-04-18 NOTE — Telephone Encounter (Signed)
Called pt on home phone and left message on voice mail re:  Pt  [facility] for CT scans today 04/18/11.    Asked pt to call office for scans rescheduling. Per Rebecka Apley in radiology,   Pt was no show for CT A/P  Today.

## 2011-04-22 ENCOUNTER — Telehealth: Payer: Self-pay | Admitting: *Deleted

## 2011-04-23 NOTE — Telephone Encounter (Signed)
Yes, Timothy Newton is okay for repeat EGD, colon on same day please. lmom for pt to call back.

## 2011-04-23 NOTE — Telephone Encounter (Signed)
Spoke with pt and he will come in for his PV on 05/07/11 and his ECL on 05/16/11.

## 2011-04-23 NOTE — Telephone Encounter (Signed)
Pt had EGD on 03/21/11 at hospital. On telephone note for 04/10/11, you wrote for 8 week f/u EGD and that pt was changing to you from [place]. April is ok for the EGD, right? Thanks. (malt lymphoplasmatic infiltrates)

## 2011-04-24 ENCOUNTER — Ambulatory Visit: Payer: BC Managed Care – PPO | Admitting: Internal Medicine

## 2011-05-07 ENCOUNTER — Encounter: Payer: Self-pay | Admitting: Internal Medicine

## 2011-05-07 ENCOUNTER — Ambulatory Visit (AMBULATORY_SURGERY_CENTER): Payer: BC Managed Care – PPO

## 2011-05-07 DIAGNOSIS — Z1211 Encounter for screening for malignant neoplasm of colon: Secondary | ICD-10-CM

## 2011-05-07 DIAGNOSIS — D62 Acute posthemorrhagic anemia: Secondary | ICD-10-CM

## 2011-05-07 DIAGNOSIS — K259 Gastric ulcer, unspecified as acute or chronic, without hemorrhage or perforation: Secondary | ICD-10-CM

## 2011-05-07 MED ORDER — PEG-KCL-NACL-NASULF-NA ASC-C 100 G PO SOLR: 1.0000 | Freq: Once | ORAL | Status: AC

## 2011-05-08 ENCOUNTER — Telehealth: Payer: Self-pay | Admitting: *Deleted

## 2011-05-08 NOTE — Telephone Encounter (Signed)
Attempted to call pt at home unsuccessfully.   Left message on voice mail for pt to call office back  To reschedule  CT scans  -   Pt  [facility] for scans 04/18/11.    RN also called emergency contact phone -  Phone not in service.

## 2011-05-09 ENCOUNTER — Telehealth: Payer: Self-pay | Admitting: *Deleted

## 2011-05-09 NOTE — Telephone Encounter (Signed)
Attempted to call pt at home unsuccessfully.   Left message on voice mail for pt to call office back.  Spoke with pt's father , and was given  pt's cell phone number.   Called pt on cell phone and left message on voice mail for pt to call office back. Pt called back.   Pt stated he was traveling for his work.   Pt stated when he gets back in town, pt will call office to reschedule missed  CT scans. Pt's   Cell   Phone      [phone].

## 2011-05-14 ENCOUNTER — Telehealth: Payer: Self-pay | Admitting: Internal Medicine

## 2011-05-14 NOTE — Telephone Encounter (Signed)
Spoke with patient and answered all his questions regarding prep. He will call back if has further questions. Ulis Rias RN

## 2011-05-16 ENCOUNTER — Ambulatory Visit (AMBULATORY_SURGERY_CENTER): Payer: BC Managed Care – PPO | Admitting: Internal Medicine

## 2011-05-16 ENCOUNTER — Encounter: Payer: Self-pay | Admitting: Internal Medicine

## 2011-05-16 VITALS — BP 142/89 | HR 72 | Temp 95.5°F | Resp 20 | Ht 74.0 in | Wt 254.0 lb

## 2011-05-16 DIAGNOSIS — Z8619 Personal history of other infectious and parasitic diseases: Secondary | ICD-10-CM

## 2011-05-16 DIAGNOSIS — D131 Benign neoplasm of stomach: Secondary | ICD-10-CM

## 2011-05-16 DIAGNOSIS — C884 Extranodal marginal zone B-cell lymphoma of mucosa-associated lymphoid tissue [MALT-lymphoma]: Secondary | ICD-10-CM

## 2011-05-16 DIAGNOSIS — Z8711 Personal history of peptic ulcer disease: Secondary | ICD-10-CM

## 2011-05-16 DIAGNOSIS — K259 Gastric ulcer, unspecified as acute or chronic, without hemorrhage or perforation: Secondary | ICD-10-CM

## 2011-05-16 DIAGNOSIS — C8589 Other specified types of non-Hodgkin lymphoma, extranodal and solid organ sites: Secondary | ICD-10-CM

## 2011-05-16 DIAGNOSIS — Z1211 Encounter for screening for malignant neoplasm of colon: Secondary | ICD-10-CM

## 2011-05-16 DIAGNOSIS — Z8719 Personal history of other diseases of the digestive system: Secondary | ICD-10-CM

## 2011-05-16 MED ORDER — SODIUM CHLORIDE 0.9 % IV SOLN: 500.0000 mL | INTRAVENOUS | Status: DC

## 2011-05-16 NOTE — Progress Notes (Signed)
Patient did not experience any of the following events: a burn prior to discharge; a fall within the facility; wrong site/side/patient/procedure/implant event; or a hospital transfer or hospital admission upon discharge from the facility. (G8907) Patient did not have preoperative order for IV antibiotic SSI prophylaxis. (G8918)  

## 2011-05-16 NOTE — Op Note (Signed)
[facility] [address] [place], [place]  [address]  COLONOSCOPY PROCEDURE REPORT  PATIENT:  Timothy, Newton  MR#:  [id number] BIRTHDATE:  02/25/1960, 50 yrs. old  GENDER:  male ENDOSCOPIST:  Carie Caddy. Cynara Tatham, MD REF. BY:  Arlan Organ, M.D. PROCEDURE DATE:  05/16/2011 PROCEDURE:  Colonoscopy [id number] ASA CLASS:  Class II INDICATIONS:  Routine Risk Screening MEDICATIONS:   MAC sedation, administered by CRNA, propofol (Diprivan) 130 mg IV  DESCRIPTION OF PROCEDURE:   After the risks benefits and alternatives of the procedure were thoroughly explained, informed consent was obtained.  Digital rectal exam was performed and revealed no rectal masses.   The LB [id number] endoscope was introduced through the anus and advanced to the cecum, which was identified by both the appendix and ileocecal valve, without limitations.  The quality of the prep was good, using MoviPrep. The instrument was then slowly withdrawn as the colon was fully examined. <<PROCEDUREIMAGES>> FINDINGS:  Moderate diverticulosis was found in the cecum to sigmoid colon.  Otherwise normal colonoscopy without other polyps, masses, vascular ectasias, or inflammatory changes.   Retroflexed views in the rectum revealed no abnormalities.  The scope was then withdrawn from the cecum and the procedure completed.  COMPLICATIONS:  None  ENDOSCOPIC IMPRESSION: 1) Moderate diverticulosis in the cecum to sigmoid colon. 2) Otherwise normal colonoscopy  RECOMMENDATIONS: 1) High fiber diet. 2) You should continue to follow colorectal cancer screening guidelines for "routine risk" patients with a repeat colonoscopy in 10 years. There is no need for FOBT (stool) testing for at least 5 years.  Carie Caddy. Rhea Belton, MD  CC:  The Patient Arlan Organ, MD Dorothyann Peng, MD  n. Rosalie DoctorCarie Caddy. Skylie Hiott at 05/16/2011 01:01 PM  Enriqueta Shutter, [phone]

## 2011-05-16 NOTE — Op Note (Signed)
[facility] [address] [place], [place]  [address]  ENDOSCOPY PROCEDURE REPORT  PATIENT:  Timothy Newton, Timothy Newton  MR#:  [id number] BIRTHDATE:  Dec 18, 1960, 50 yrs. old  GENDER:  male ENDOSCOPIST:  Carie Caddy. Aislee Landgren, MD Referred by:  Arlan Organ, M.D. PROCEDURE DATE:  05/16/2011 PROCEDURE:  EGD with biopsy, [id number] ASA CLASS:  Class II INDICATIONS:  history of MALT, surveillance gastric ulceration MEDICATIONS:   MAC sedation, administered by CRNA, propofol (Diprivan) 400 mg IV TOPICAL ANESTHETIC:  none  DESCRIPTION OF PROCEDURE:   After the risks benefits and alternatives of the procedure were thoroughly explained, informed consent was obtained.  The LB GIF-H180 [id number] endoscope was introduced through the mouth and advanced to the second portion of the duodenum, without limitations.  The instrument was slowly withdrawn as the mucosa was fully examined. <<PROCEDUREIMAGES>>  The esophagus and gastroesophageal junction were completely normal in appearance.  Mildly nodular mucosa was found in the cardia/fundus. Multiple biopsies were obtained and sent to pathology.  The previously placed endoclips (2) were in place and the underlying ulceration was healed. Biopsies were also obtained from the gastric body and placed in a separate jar. Otherwise normal stomach.  The duodenal bulb and second duodenum were without abnormalities.    Retroflexed views revealed  findings as previously described.    The scope was then withdrawn from the patient and the procedure completed.  COMPLICATIONS:  None  ENDOSCOPIC IMPRESSION: 1) Normal esophagus 2) Mild nodular mucosa in the cardia/fundus.  Previously placed endoclips in place with healed mucosa underlying the clips. Biopsies performed. 3) Otherwise normal stomach 4) Normal examined duodenum  RECOMMENDATIONS: 1) Await pathology results 2) Continue current medications  Chistopher Mangino M. Rhea Belton, MD  CC:  The Patient Arlan Organ,  MD Dorothyann Peng, MD  n. Rosalie DoctorCarie Caddy. Zoe Nordin at 05/16/2011 12:57 PM  Enriqueta Shutter, [phone]

## 2011-05-16 NOTE — Progress Notes (Signed)
PROPOFOL PER S CAMP CRNA. SEE SCANNED INTRA PROCEDURE REPORT. EWM 

## 2011-05-16 NOTE — Patient Instructions (Signed)
Patient is HIPPA unable to go over regular discharge instructions with responsible party. Only instructions concerning eating and activities for the today. Resume previous medications.YOU HAD AN ENDOSCOPIC PROCEDURE TODAY AT THE  ENDOSCOPY CENTER: Refer to the procedure report that was given to you for any specific questions about what was found during the examination.  If the procedure report does not answer your questions, please call your gastroenterologist to clarify.  If you requested that your care partner not be given the details of your procedure findings, then the procedure report has been included in a sealed envelope for you to review at your convenience later.  YOU SHOULD EXPECT: Some feelings of bloating in the abdomen. Passage of more gas than usual.  Walking can help get rid of the air that was put into your GI tract during the procedure and reduce the bloating. If you had a lower endoscopy (such as a colonoscopy or flexible sigmoidoscopy) you may notice spotting of blood in your stool or on the toilet paper. If you underwent a bowel prep for your procedure, then you may not have a normal bowel movement for a few days.  DIET: Your first meal following the procedure should be a light meal and then it is ok to progress to your normal diet.  A half-sandwich or bowl of soup is an example of a good first meal.  Heavy or fried foods are harder to digest and may make you feel nauseous or bloated.  Likewise meals heavy in dairy and vegetables can cause extra gas to form and this can also increase the bloating.  Drink plenty of fluids but you should avoid alcoholic beverages for 24 hours.  ACTIVITY: Your care partner should take you home directly after the procedure.  You should plan to take it easy, moving slowly for the rest of the day.  You can resume normal activity the day after the procedure however you should NOT DRIVE or use heavy machinery for 24 hours (because of the sedation medicines  used during the test).    SYMPTOMS TO REPORT IMMEDIATELY: A gastroenterologist can be reached at any hour.  During normal business hours, 8:30 AM to 5:00 PM Monday through Friday, call [phone].  After hours and on weekends, please call the GI answering service at [phone] who will take a message and have the physician on call contact you.   Following lower endoscopy (colonoscopy or flexible sigmoidoscopy):  Excessive amounts of blood in the stool  Significant tenderness or worsening of abdominal pains  Swelling of the abdomen that is new, acute  Fever of 100F or higher  Following upper endoscopy (EGD)  Vomiting of blood or coffee ground material  New chest pain or pain under the shoulder blades  Painful or persistently difficult swallowing  New shortness of breath  Fever of 100F or higher  Black, tarry-looking stools  FOLLOW UP: If any biopsies were taken you will be contacted by phone or by letter within the next 1-3 weeks.  Call your gastroenterologist if you have not heard about the biopsies in 3 weeks.  Our staff will call the home number listed on your records the next business day following your procedure to check on you and address any questions or concerns that you may have at that time regarding the information given to you following your procedure. This is a courtesy call and so if there is no answer at the home number and we have not heard from you through the  emergency physician on call, we will assume that you have returned to your regular daily activities without incident.  SIGNATURES/CONFIDENTIALITY: You and/or your care partner have signed paperwork which will be entered into your electronic medical record.  These signatures attest to the fact that that the information above on your After Visit Summary has been reviewed and is understood.  Full responsibility of the confidentiality of this discharge information lies with you and/or your care-partner.

## 2011-05-17 ENCOUNTER — Telehealth: Payer: Self-pay | Admitting: *Deleted

## 2011-05-17 NOTE — Telephone Encounter (Signed)
Left message on number given in admitting yesterday. ewm 

## 2011-05-23 ENCOUNTER — Ambulatory Visit: Payer: BC Managed Care – PPO | Admitting: Lab

## 2011-05-23 ENCOUNTER — Encounter: Payer: Self-pay | Admitting: Hematology and Oncology

## 2011-05-23 ENCOUNTER — Other Ambulatory Visit: Payer: BC Managed Care – PPO | Admitting: Internal Medicine

## 2011-05-23 ENCOUNTER — Telehealth: Payer: Self-pay | Admitting: Hematology and Oncology

## 2011-05-23 ENCOUNTER — Other Ambulatory Visit (HOSPITAL_BASED_OUTPATIENT_CLINIC_OR_DEPARTMENT_OTHER): Payer: BC Managed Care – PPO | Admitting: Lab

## 2011-05-23 ENCOUNTER — Ambulatory Visit (HOSPITAL_BASED_OUTPATIENT_CLINIC_OR_DEPARTMENT_OTHER): Payer: BC Managed Care – PPO | Admitting: Hematology and Oncology

## 2011-05-23 VITALS — BP 127/73 | HR 81 | Temp 98.2°F | Ht 74.0 in | Wt 251.0 lb

## 2011-05-23 DIAGNOSIS — C884 Extranodal marginal zone b-cell lymphoma of mucosa-associated lymphoid tissue (malt-lymphoma) not having achieved remission: Secondary | ICD-10-CM | POA: Insufficient documentation

## 2011-05-23 DIAGNOSIS — D539 Nutritional anemia, unspecified: Secondary | ICD-10-CM

## 2011-05-23 DIAGNOSIS — C8589 Other specified types of non-Hodgkin lymphoma, extranodal and solid organ sites: Secondary | ICD-10-CM

## 2011-05-23 LAB — CBC WITH DIFFERENTIAL/PLATELET
Basophils Absolute: 0 10*3/uL (ref 0.0–0.1)
EOS%: 3.8 % (ref 0.0–7.0)
HCT: 43.7 % (ref 38.4–49.9)
HGB: 14.2 g/dL (ref 13.0–17.1)
LYMPH%: 12.8 % — ABNORMAL LOW (ref 14.0–49.0)
MCH: 28.8 pg (ref 27.2–33.4)
MONO#: 0.9 10*3/uL (ref 0.1–0.9)
NEUT%: 76 % — ABNORMAL HIGH (ref 39.0–75.0)
Platelets: 183 10*3/uL (ref 140–400)
lymph#: 1.6 10*3/uL (ref 0.9–3.3)

## 2011-05-23 LAB — COMPREHENSIVE METABOLIC PANEL
Albumin: 4.3 g/dL (ref 3.5–5.2)
BUN: 9 mg/dL (ref 6–23)
Calcium: 9.6 mg/dL (ref 8.4–10.5)
Chloride: 103 mEq/L (ref 96–112)
Creatinine, Ser: 1.02 mg/dL (ref 0.50–1.35)
Glucose, Bld: 100 mg/dL — ABNORMAL HIGH (ref 70–99)
Potassium: 4.1 mEq/L (ref 3.5–5.3)

## 2011-05-23 NOTE — Patient Instructions (Signed)
Patient to follow up as instructed.   Current Outpatient Prescriptions  Medication Sig Dispense Refill  . ferrous sulfate 325 (65 FE) MG tablet Take 1 tablet (325 mg total) by mouth 2 (two) times daily.  30 tablet  3  . lansoprazole (PREVACID) 30 MG capsule Take 1 capsule (30 mg total) by mouth 2 (two) times daily.  30 capsule  11  . lisinopril (PRINIVIL,ZESTRIL) 10 MG tablet Take 10 mg by mouth daily.      . Multiple Vitamins-Minerals (MULTIVITAMINS THER. W/MINERALS) TABS Take 1 tablet by mouth daily.      . Vitamins C E (CRANBERRY CONCENTRATE PO) Take 2 tablets by mouth.            April 2013  Sunday Monday Tuesday Wednesday Thursday Friday Saturday      1   2   PRE OP 30 WQ   8:00 AM  (30 min.)  Heloise Purpura, RN  [facility] 3   4   5   6    7   8   9   10   11    PROPOFOL ENDO COL  11:30 AM  (30 min.)  Beverley Fiedler, MD  [facility] 12   13    14   15   16   17   18    LAB MO     8:30 AM  (15 min.)  Mauri Brooklyn  [facility]   EST PT 30   9:00 AM  (30 min.)  Laurice Record, MD  [facility] 19   20    21   22   23   24   25   26   27    28   29    30

## 2011-05-23 NOTE — Progress Notes (Signed)
CC:   Robyn N. Allyne Gee, M.D. Iva Boop, MD,FACG Erick Blinks, MD  IDENTIFYING STATEMENT:  The patient is a 51 year old man with a history of gastric malt lymphoma who presents for followup.  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  He recently came to medical attention with black and tarry stools associated with anemia.  Initial endoscopy on 03/21/2011 had shown an ulcer in the fundus of the stomach with surrounding nodularity within the fundus, cardia and gastric body.  Biopsies were  composed of a flat inflamed gastric body and antral mucosa with lymphoproliferation. Immunohistochemical stain showed no evidence of lymphoepithelial lesions.  The patient was placed on PPIs.  He received a followup endoscopy with colonoscopy on 05/16/2011.  Colonoscopy revealed diverticulosis, but was otherwise unremarkable.  Endoscopy showed a normal esophagus, mild nodular mucosa in the cardia and fundus.  The previously placed endo clips were in place with healed mucosa underlying the clips.  Biopsies were performed.  This revealed benign gastric mucosa with mild chronic inflammation with atypical lymphoid infiltrates.  Warthin-Starry stain continued to be negative for H. pylori.  The patient was a no-show for PET/CT scan.  He feels well.  He has no complaints.  Has not had any further episodes of abdominal pain or black tarry stools.  He does continue to note a mass around his umbilicus.  He has no fever or chills.  MEDICATIONS:  Reviewed and updated.  ALLERGIES:  None.  PAST MEDICAL HISTORY/FAMILY HISTORY/SOCIAL HISTORY:  Unchanged.  REVIEW OF SYSTEMS:  10 point review of systems negative.  PHYSICAL EXAM:  Patient is alert and oriented x3.  Vitals:  Pulse 81, blood pressure 127/73, temp 98.2, respirations 18, weight 251 pounds. HEENT:  Head is atraumatic, normocephalic.  Mouth  moist.  Neck:  Supple. Chest:  Clear.  Abdomen:  Soft, nontender.  Bowel sounds present. Extremities:  No edema.  LAB DATA:  05/23/2011 white cell count 12.6, hemoglobin 14.2, hematocrit 43.7, platelets 183.  Iron studies on 04/05/2011:  Iron 307, TIBC 396, saturation 78, ferritin 30, folate 16.2.  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  At that time was managed with triple therapy.  He was then lost to followup, but presented recently with a bleeding gastric ulcer, and status post endoscopic clip placement.  Follow upper endoscopy notes healing and additional biopsies does not indicate any evidence of recurrent gastric malt lymphoma.  The patient does, however, require scan and he has agreed to reschedule his PET and CT scan.  We will telephone him with results and recommendations.     ______________________________ Laurice Record, M.D. LIO/MEDQ  D:  05/23/2011  T:  05/23/2011  Job:  [id number]

## 2011-05-23 NOTE — Progress Notes (Signed)
This office note has been dictated.

## 2011-05-23 NOTE — Telephone Encounter (Signed)
Gave pt appt date for April 30th for CT and PET Scan, pt will see Md in October 2013 lab and MD

## 2011-05-31 ENCOUNTER — Encounter: Payer: Self-pay | Admitting: Internal Medicine

## 2011-06-04 ENCOUNTER — Encounter (HOSPITAL_COMMUNITY)
Admission: RE | Admit: 2011-06-04 | Discharge: 2011-06-04 | Disposition: A | Discharge status: Home / Self Care | Payer: BC Managed Care – PPO | Source: Ambulatory Visit | Attending: Hematology and Oncology | Admitting: Hematology and Oncology

## 2011-06-04 DIAGNOSIS — D539 Nutritional anemia, unspecified: Secondary | ICD-10-CM

## 2011-06-04 DIAGNOSIS — C884 Extranodal marginal zone B-cell lymphoma of mucosa-associated lymphoid tissue [MALT-lymphoma]: Secondary | ICD-10-CM

## 2011-06-04 DIAGNOSIS — C8589 Other specified types of non-Hodgkin lymphoma, extranodal and solid organ sites: Secondary | ICD-10-CM | POA: Insufficient documentation

## 2011-06-04 DIAGNOSIS — K5730 Diverticulosis of large intestine without perforation or abscess without bleeding: Secondary | ICD-10-CM | POA: Insufficient documentation

## 2011-06-04 DIAGNOSIS — R918 Other nonspecific abnormal finding of lung field: Secondary | ICD-10-CM | POA: Insufficient documentation

## 2011-06-04 LAB — GLUCOSE, CAPILLARY: Glucose-Capillary: 101 mg/dL — ABNORMAL HIGH (ref 70–99)

## 2011-06-04 MED ORDER — FLUDEOXYGLUCOSE F - 18 (FDG) INJECTION
17.3000 | Freq: Once | INTRAVENOUS | Status: AC | PRN
  Administered 2011-06-04: 17.3 via INTRAVENOUS

## 2011-06-04 MED ORDER — IOHEXOL 300 MG/ML  SOLN
100.0000 mL | Freq: Once | INTRAMUSCULAR | Status: AC | PRN
  Administered 2011-06-04: 100 mL via INTRAVENOUS

## 2011-06-05 ENCOUNTER — Other Ambulatory Visit: Payer: Self-pay | Admitting: Hematology and Oncology

## 2011-06-05 DIAGNOSIS — R911 Solitary pulmonary nodule: Secondary | ICD-10-CM

## 2011-06-06 ENCOUNTER — Telehealth: Payer: Self-pay | Admitting: Hematology and Oncology

## 2011-06-06 NOTE — Telephone Encounter (Signed)
lmonvm for pt re appt for ct 6/11. Then called cell and s/w pt directly confirming d/t.

## 2011-07-16 ENCOUNTER — Ambulatory Visit (HOSPITAL_COMMUNITY)
Admission: RE | Admit: 2011-07-16 | Discharge: 2011-07-16 | Disposition: A | Discharge status: Home / Self Care | Payer: BC Managed Care – PPO | Source: Ambulatory Visit | Attending: Hematology and Oncology | Admitting: Hematology and Oncology

## 2011-07-16 DIAGNOSIS — R911 Solitary pulmonary nodule: Secondary | ICD-10-CM

## 2011-07-16 DIAGNOSIS — R222 Localized swelling, mass and lump, trunk: Secondary | ICD-10-CM | POA: Insufficient documentation

## 2011-07-16 DIAGNOSIS — R918 Other nonspecific abnormal finding of lung field: Secondary | ICD-10-CM | POA: Insufficient documentation

## 2011-07-16 MED ORDER — IOHEXOL 300 MG/ML  SOLN
100.0000 mL | Freq: Once | INTRAMUSCULAR | Status: AC | PRN
  Administered 2011-07-16: 100 mL via INTRAVENOUS

## 2011-07-17 ENCOUNTER — Other Ambulatory Visit: Payer: Self-pay | Admitting: Hematology and Oncology

## 2011-07-17 DIAGNOSIS — K250 Acute gastric ulcer with hemorrhage: Secondary | ICD-10-CM

## 2011-07-18 ENCOUNTER — Telehealth: Payer: Self-pay | Admitting: Hematology and Oncology

## 2011-07-18 NOTE — Telephone Encounter (Signed)
bx already on schedule for 6/19. No other orders.

## 2011-07-24 ENCOUNTER — Ambulatory Visit (HOSPITAL_COMMUNITY): Payer: BC Managed Care – PPO

## 2011-08-01 ENCOUNTER — Telehealth: Payer: Self-pay | Admitting: Hematology and Oncology

## 2011-08-01 ENCOUNTER — Encounter: Payer: Self-pay | Admitting: Hematology and Oncology

## 2011-08-01 DIAGNOSIS — R918 Other nonspecific abnormal finding of lung field: Secondary | ICD-10-CM

## 2011-08-01 NOTE — Telephone Encounter (Signed)
Was informed that Timothy Newton declined CT biopsy of new enlarging lung mass.  Spoke with patient on telephone and he does not want to have the procedure done.  I managed to convince him to at least have A CXR and follow up visit with me in October.

## 2011-08-01 NOTE — Telephone Encounter (Signed)
S/w Tiffany in central scheduling yesterday. Per Tiffany Mr. Detweiler refused bx procedure. LO made aware. Per LO have Tiffany document pt refused procedure and she will speak with patient. Tiffany aware.

## 2011-08-05 ENCOUNTER — Telehealth: Payer: Self-pay | Admitting: Hematology and Oncology

## 2011-08-05 NOTE — Telephone Encounter (Signed)
Per staff message from LO 6/27 schedule pt for cxr a day before next vsit - order in EPIC. lmonvm for pt re doing a cxr 11/27/11 @ [facility] (walk in). Referral for cxr and schedule for October mailed today.

## 2011-11-13 ENCOUNTER — Telehealth: Payer: Self-pay | Admitting: Hematology and Oncology

## 2011-11-13 ENCOUNTER — Other Ambulatory Visit: Payer: Self-pay | Admitting: Nurse Practitioner

## 2011-11-13 ENCOUNTER — Telehealth: Payer: Self-pay | Admitting: Nurse Practitioner

## 2011-11-13 DIAGNOSIS — R911 Solitary pulmonary nodule: Secondary | ICD-10-CM

## 2011-11-13 DIAGNOSIS — C8589 Other specified types of non-Hodgkin lymphoma, extranodal and solid organ sites: Secondary | ICD-10-CM

## 2011-11-13 NOTE — Telephone Encounter (Signed)
Spoke with patient.  Inquired if he planned to come to 11/28/11 visit.  Patient stated he did.  RN reinforced chest xray he is to have done as walkin at [facility] a few days before appt. Pt verbalized understanding.

## 2011-11-13 NOTE — Telephone Encounter (Signed)
lmonvm on home phone re ct for 10/21. Also called cell and s/w pt. Per pt he can not do Monday 10/21. appt moved to 10/22 @ 8 am and pt is aware of d/t/location and instruction. Pt also advised to disregard the message on home phone re ct 10/21.

## 2011-11-25 ENCOUNTER — Other Ambulatory Visit (HOSPITAL_COMMUNITY): Payer: BC Managed Care – PPO

## 2011-11-26 ENCOUNTER — Ambulatory Visit (HOSPITAL_COMMUNITY): Payer: BC Managed Care – PPO

## 2011-11-27 ENCOUNTER — Telehealth: Payer: Self-pay | Admitting: Hematology and Oncology

## 2011-11-27 NOTE — Telephone Encounter (Signed)
Returned pt's call and pt requested that 10/24 appt be cancelled. Per pt he will call back to r/s. Message to desk nurse.

## 2011-11-28 ENCOUNTER — Other Ambulatory Visit: Payer: BC Managed Care – PPO | Admitting: Lab

## 2011-11-28 ENCOUNTER — Ambulatory Visit: Payer: BC Managed Care – PPO | Admitting: Hematology and Oncology

## 2012-01-03 ENCOUNTER — Telehealth: Payer: Self-pay | Admitting: *Deleted

## 2012-01-03 NOTE — Telephone Encounter (Signed)
Spoke with pt at home today as a f/u call from [facility] in October.   Pt stated  he DID NOT WISH TO BE CONTACTED ANY MORE ABOUT APPTS.    Asked and confirmed with  pt  X 2 -   If pt  Did not wish to come back to the clinic again;  Pt stated "  That is  Correct . "   Pt was upset since nurse called and woke pt up from his nap.   Message to md for review.

## 2013-04-22 IMAGING — CT CT ABD-PELV W/ CM
2 of 5 series · 17 of 46 positions shown, 19 images · IV contrast ([ID] [id number])
Comparison: Scanned [id number], CT scan 02/18/2005

CLINICAL DATA: Malt lymphoma.

CT ABDOMEN AND PELVIS WITH CONTRAST
TECHNIQUE: Multidetector CT imaging of the abdomen and pelvis was
performed following the standard protocol during bolus
administration of intravenous contrast.
Contrast: 100mL OMNIPAQUE IOHEXOL 300 MG/ML  SOLN

[Series 2: rtn a/p with · axial · 0.83mm/px · z∈[-510,-100]mm · 14 of 92 slices shown, 16 images]
[im 5/92  soft-tissue]
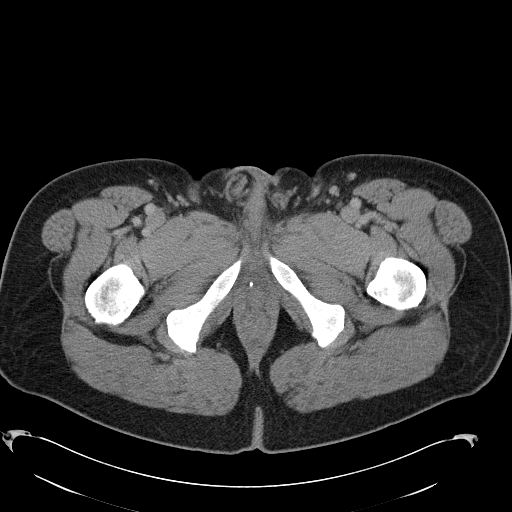
[im 5/92  bone]
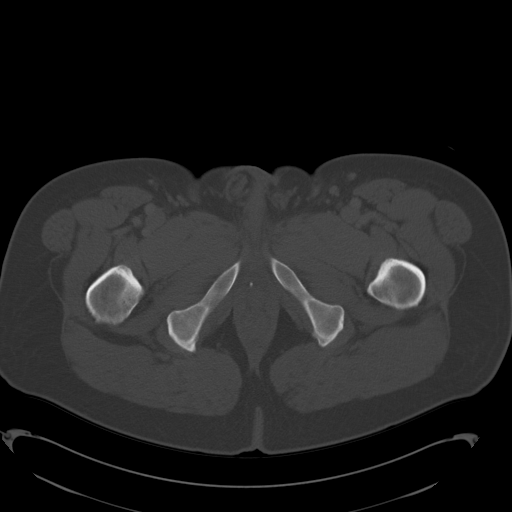
[im 14/92  soft-tissue]
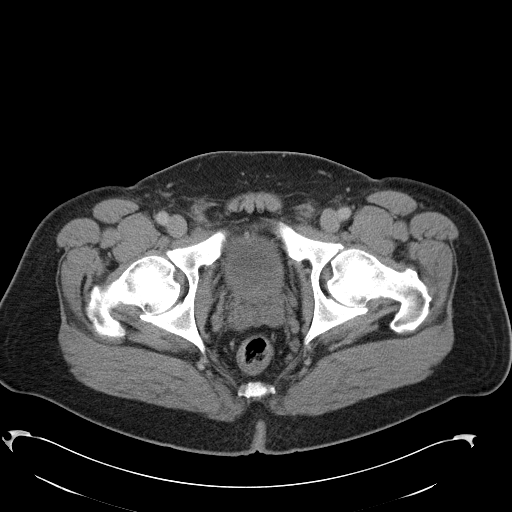
[im 19/92  soft-tissue]
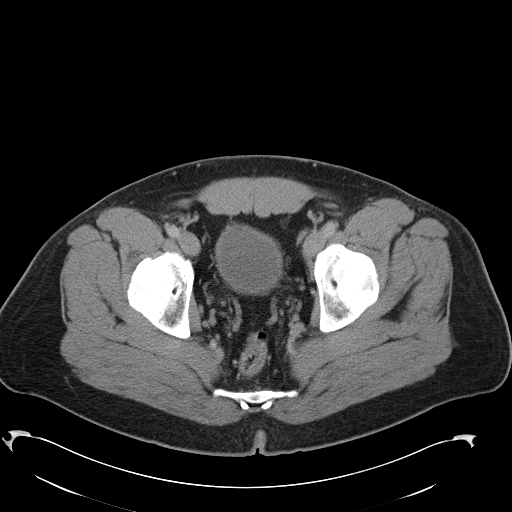
[im 23/92  soft-tissue]
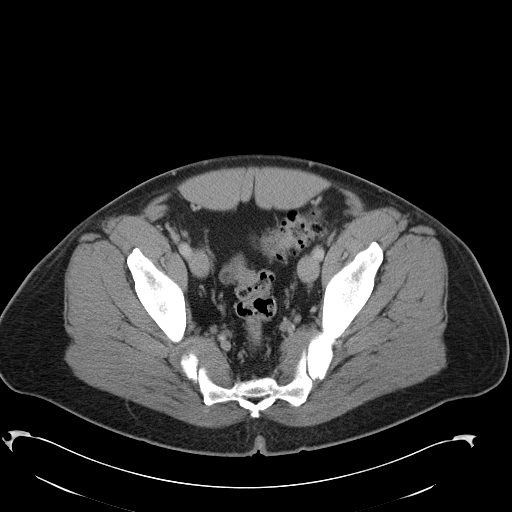
[im 32/92  soft-tissue]
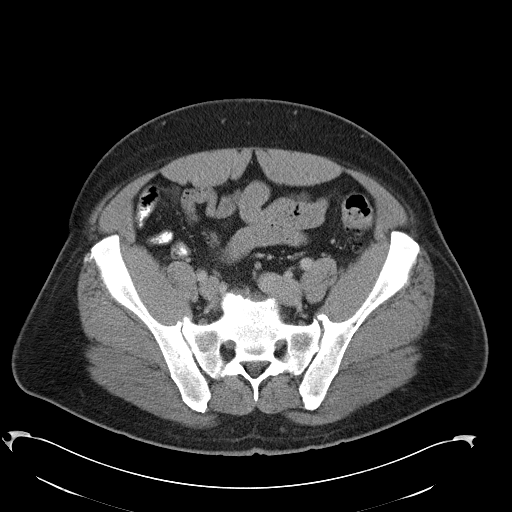
[im 37/92  soft-tissue]
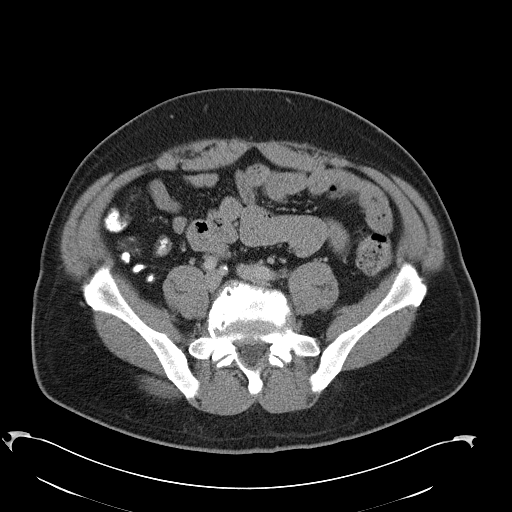
[im 41/92  soft-tissue]
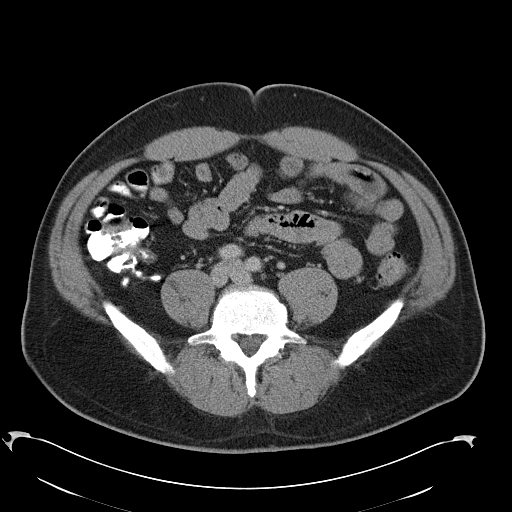
[im 51/92  soft-tissue]
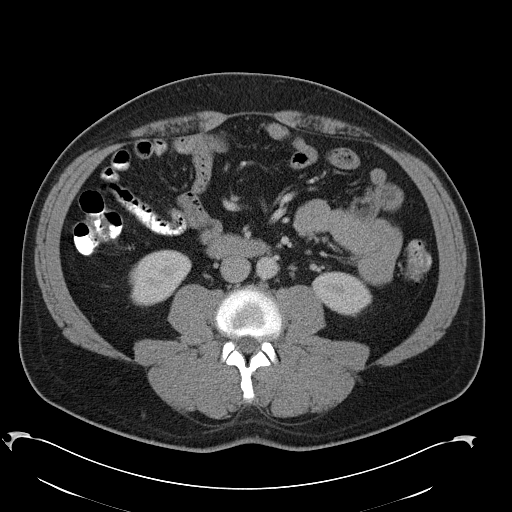
[im 55/92  soft-tissue]
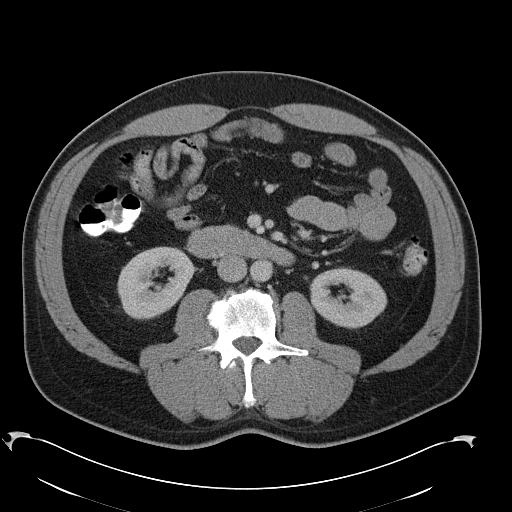
[im 55/92  bone]
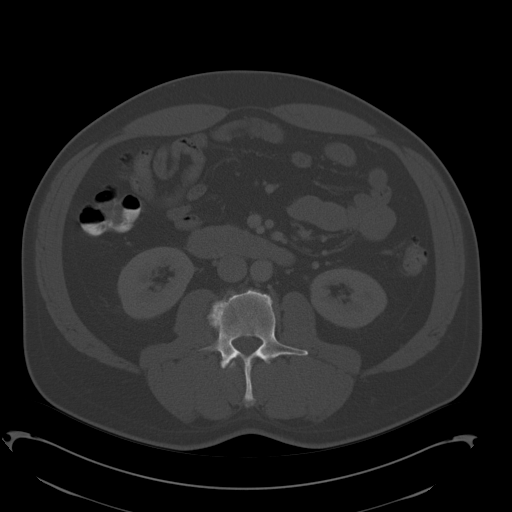
[im 60/92  soft-tissue]
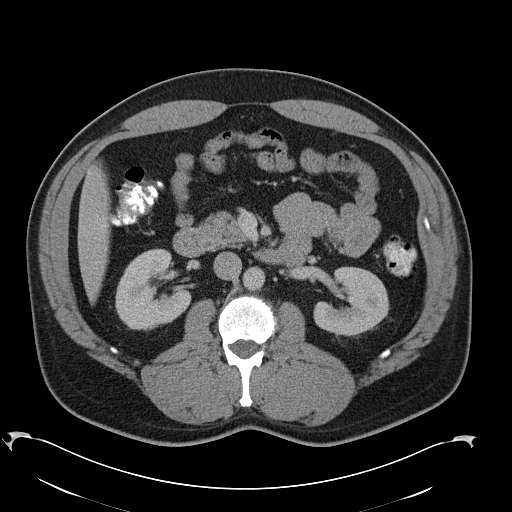
[im 69/92  soft-tissue]
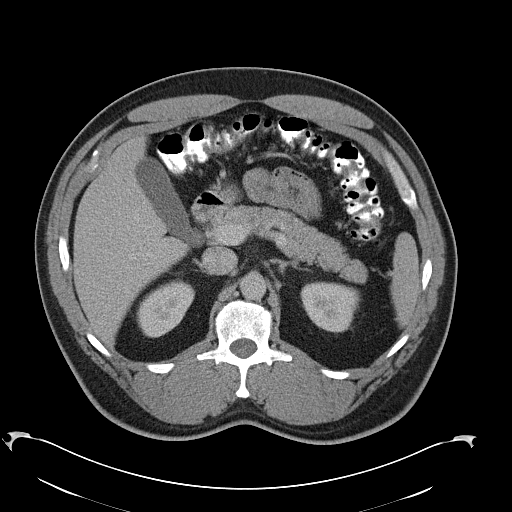
[im 73/92  soft-tissue]
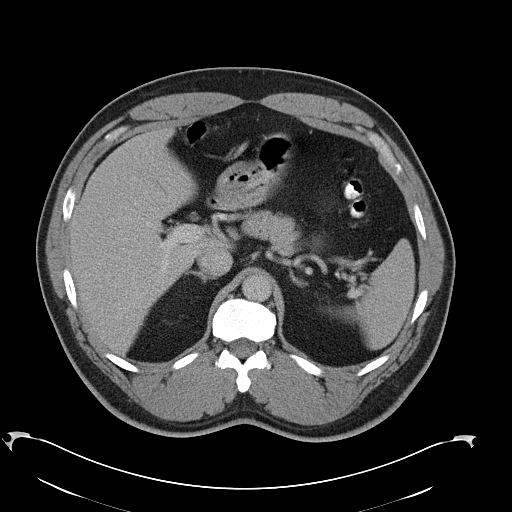
[im 78/92  soft-tissue]
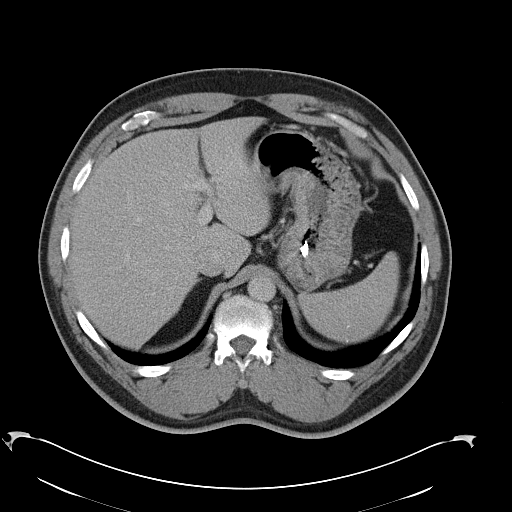
[im 87/92  soft-tissue]
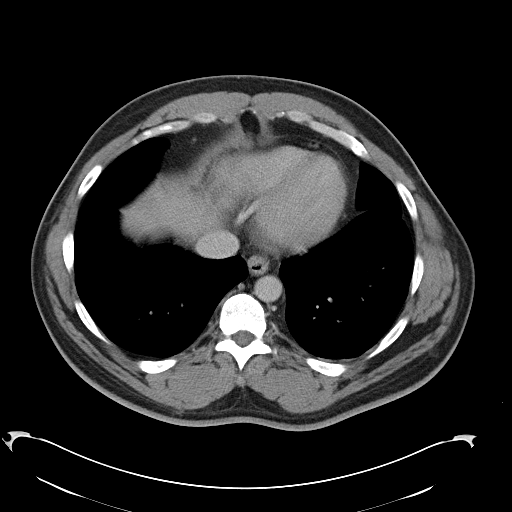

[Series 602: <mpr thick range> · coronal · 0.89mm/px · 3 of 95 slices shown]
[im 32/95  soft-tissue]
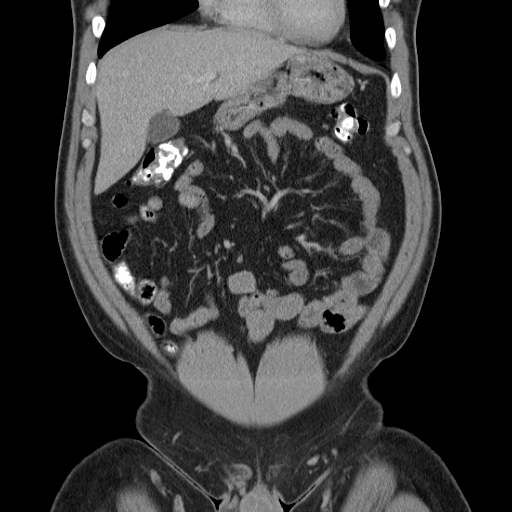
[im 42/95  soft-tissue]
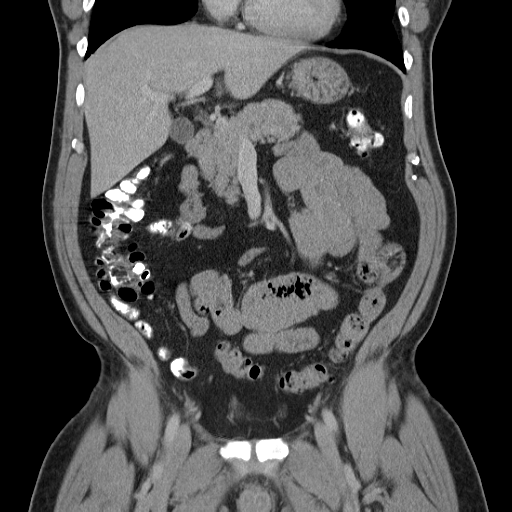
[im 53/95  soft-tissue]
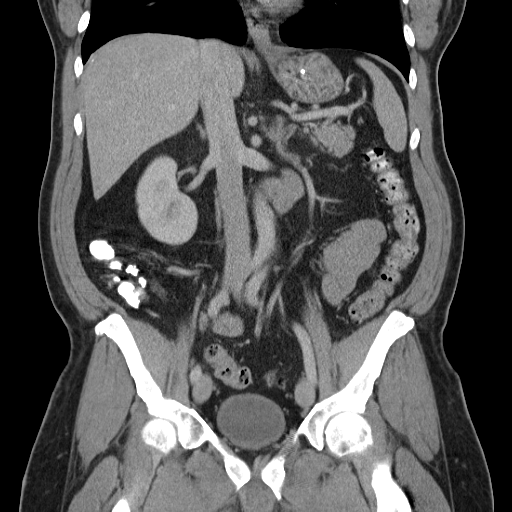

[17 of 46 positions shown; findings below may reference images not displayed]

FINDINGS: The lung bases are clear.  No pericardial fluid.

No focal hepatic lesion.  The posterior wall of the greater
curvature of the stomach is thickened to 2.7 cm.  No focal mass.
There is a biopsy clip noted within the fundal region.  The
duodenum, small bowel, and cecum are normal.  Appendix is normal.
Multiple diverticula of the descending colon and sigmoid colon
without acute inflammation.

The gallbladder, spleen, pancreas, adrenal glands are normal.
Kidneys are normal.  Abdominal aorta normal caliber.  No
retroperitoneal periportal lymphadenopathy.

No free fluid the pelvis.  Prostate gland and bladder normal.  No
pelvic lymphadenopathy Review of  bone windows demonstrates no
aggressive osseous lesions.
IMPRESSION: 1.. Gastric mucosal thickening without evidence of gastric mass.
2.  Sigmoid diverticulosis without diverticulitis.

## 2018-01-15 ENCOUNTER — Ambulatory Visit: Payer: Self-pay | Admitting: Internal Medicine

## 2018-01-15 ENCOUNTER — Encounter: Payer: Self-pay | Admitting: Internal Medicine

## 2018-01-15 VITALS — BP 130/80 | HR 82 | Temp 98.0°F | Ht 74.0 in | Wt 281.0 lb

## 2018-01-15 DIAGNOSIS — E669 Obesity, unspecified: Secondary | ICD-10-CM

## 2018-01-15 DIAGNOSIS — I10 Essential (primary) hypertension: Secondary | ICD-10-CM

## 2018-01-15 DIAGNOSIS — R635 Abnormal weight gain: Secondary | ICD-10-CM

## 2018-01-15 NOTE — Patient Instructions (Signed)

## 2018-01-15 NOTE — Progress Notes (Signed)
Subjective:     Patient ID: Timothy Newton , male    DOB: 01/12/1961 , 57 y.o.   MRN: [id number]   Chief Complaint  Patient presents with  . Hypertension    HPI 1-Pt is here due to noticing increased temporal arteries promience, but they were not this way a few months ago. Denies pain. He acknowledge he has gained about 40 lbs in the past 3 years. He does snore and has not had a sleep test. Does not wake up gasping for air. He also did not check his BP when he noticed the prominence. Denies having a HA or face pain. He has also noticed in the am's his hands feel tight. Otherwise he feels fine.  2-He also wants advise and help to get back to loosing wt. Going vegan helped him loose wt and he was also running. Has not been exercising at all and does eat meat.     Past Medical History:  Diagnosis Date  . Anemia   . Bleeding gastric ulcer   . Blood transfusion   . H. pylori infection 2006   tx 14 d Nexium, amoxicillin and clarithromycin  . Hypertension   . MALT lymphoma ([place]) 2006   s/p 14 days Nexium, clarithromycin and amoxicillin  . Neuromuscular disorder ([place])      Family History  Problem Relation Age of Onset  . Colon cancer Neg Hx   . Diabetes Neg Hx   . Heart disease Neg Hx      Current Outpatient Medications:  .  lisinopril (PRINIVIL,ZESTRIL) 10 MG tablet, Take 10 mg by mouth daily., Disp: , Rfl:    No Known Allergies   Review of Systems  Constitutional: Negative for activity change, appetite change, chills, diaphoresis, fatigue and fever.  HENT: Negative for ear discharge, ear pain, rhinorrhea and sneezing.   Respiratory: Negative for shortness of breath.   Cardiovascular: Negative for chest pain.  Musculoskeletal: Negative for arthralgias.  Skin: Negative for rash.  Neurological: Negative for dizziness, facial asymmetry, speech difficulty, weakness, numbness and headaches.  Hematological: Negative for adenopathy.  Psychiatric/Behavioral: Negative for sleep  disturbance.     Today's Vitals   01/15/18 0857  BP: 130/80  Pulse: 82  Temp: 98 F (36.7 C)  TempSrc: Oral  SpO2: 95%  Weight: 281 lb (127.5 kg)  Height: 6\' 2"  (1.88 m)   Body mass index is 36.08 kg/m.   Objective:  Physical Exam Vitals signs and nursing note reviewed.  Constitutional:      General: He is not in acute distress.    Appearance: He is obese. He is not ill-appearing, toxic-appearing or diaphoretic.  HENT:     Head: Normocephalic and atraumatic.     Comments: Temporal arteries are symmetric, soft, non tender without bruits.     Right Ear: External ear normal.     Left Ear: External ear normal.  Eyes:     General: No scleral icterus.    Conjunctiva/sclera: Conjunctivae normal.  Neck:     Musculoskeletal: Neck supple. No neck rigidity or muscular tenderness.     Vascular: No carotid bruit.  Cardiovascular:     Rate and Rhythm: Normal rate and regular rhythm.     Pulses: Normal pulses.     Heart sounds: Normal heart sounds. No murmur.  Pulmonary:     Effort: Pulmonary effort is normal. No respiratory distress.     Breath sounds: Normal breath sounds.  Lymphadenopathy:     Cervical: No cervical adenopathy.  Neurological:  Mental Status: He is alert.  Psychiatric:        Mood and Affect: Mood normal.        Behavior: Behavior normal.        Thought Content: Thought content normal.        Judgment: Judgment normal.    Assessment And Plan:    1. Essential hypertension- stable - Hemoglobin A1c - CMP14 + Anion Gap - CBC no Diff - Lipid Profile  2. Weight gain-  - TSH - T4, Free - T3, free 3- Obesity- will start increasing his activity, and go to becoming vegan again. He wanted to get the Lipo B shot, so this was given to him.   FU in 1 month for weight check.    Korion Cuevas RODRIGUEZ-SOUTHWORTH, PA-C

## 2018-01-16 LAB — CMP14 + ANION GAP
ALK PHOS: 71 IU/L (ref 39–117)
ALT: 25 IU/L (ref 0–44)
ANION GAP: 15 mmol/L (ref 10.0–18.0)
AST: 17 IU/L (ref 0–40)
Albumin/Globulin Ratio: 1.6 (ref 1.2–2.2)
Albumin: 4.5 g/dL (ref 3.5–5.5)
BUN/Creatinine Ratio: 8 — ABNORMAL LOW (ref 9–20)
BUN: 9 mg/dL (ref 6–24)
Bilirubin Total: 1.1 mg/dL (ref 0.0–1.2)
CALCIUM: 10.4 mg/dL — AB (ref 8.7–10.2)
CO2: 25 mmol/L (ref 20–29)
Chloride: 99 mmol/L (ref 96–106)
Creatinine, Ser: 1.2 mg/dL (ref 0.76–1.27)
GFR calc Af Amer: 78 mL/min/{1.73_m2} (ref >59)
GFR, EST NON AFRICAN AMERICAN: 67 mL/min/{1.73_m2} (ref >59)
Globulin, Total: 2.8 g/dL (ref 1.5–4.5)
Glucose: 110 mg/dL — ABNORMAL HIGH (ref 65–99)
Potassium: 4 mmol/L (ref 3.5–5.2)
Sodium: 139 mmol/L (ref 134–144)
Total Protein: 7.3 g/dL (ref 6.0–8.5)

## 2018-01-16 LAB — CBC
HEMATOCRIT: 47 % (ref 37.5–51.0)
HEMOGLOBIN: 15.3 g/dL (ref 13.0–17.7)
MCH: 28.8 pg (ref 26.6–33.0)
MCHC: 32.6 g/dL (ref 31.5–35.7)
MCV: 89 fL (ref 79–97)
Platelets: 204 10*3/uL (ref 150–450)
RBC: 5.31 x10E6/uL (ref 4.14–5.80)
RDW: 11.2 % — ABNORMAL LOW (ref 12.3–15.4)
WBC: 5.7 10*3/uL (ref 3.4–10.8)

## 2018-01-16 LAB — LIPID PANEL
Chol/HDL Ratio: 5.2 ratio — ABNORMAL HIGH (ref 0.0–5.0)
Cholesterol, Total: 206 mg/dL — ABNORMAL HIGH (ref 100–199)
HDL: 40 mg/dL (ref >39)
LDL CALC: 143 mg/dL — AB (ref 0–99)
Triglycerides: 117 mg/dL (ref 0–149)
VLDL CHOLESTEROL CAL: 23 mg/dL (ref 5–40)

## 2018-01-16 LAB — TSH: TSH: 1.09 u[IU]/mL (ref 0.450–4.500)

## 2018-01-16 LAB — T4, FREE: Free T4: 1.03 ng/dL (ref 0.82–1.77)

## 2018-01-16 LAB — T3, FREE: T3, Free: 3.7 pg/mL (ref 2.0–4.4)

## 2018-01-16 LAB — HEMOGLOBIN A1C
ESTIMATED AVERAGE GLUCOSE: 117 mg/dL
HEMOGLOBIN A1C: 5.7 % — AB (ref 4.8–5.6)

## 2018-02-17 ENCOUNTER — Encounter: Payer: Self-pay | Admitting: Internal Medicine

## 2018-02-17 ENCOUNTER — Ambulatory Visit (INDEPENDENT_AMBULATORY_CARE_PROVIDER_SITE_OTHER): Payer: Self-pay | Admitting: Internal Medicine

## 2018-02-17 VITALS — BP 128/88 | HR 76 | Temp 98.1°F | Ht 72.4 in | Wt 284.6 lb

## 2018-02-17 DIAGNOSIS — E669 Obesity, unspecified: Secondary | ICD-10-CM

## 2018-02-17 DIAGNOSIS — R7303 Prediabetes: Secondary | ICD-10-CM

## 2018-02-17 NOTE — Progress Notes (Signed)
  PT LEFT WITHOUT BEING SEEN.

## 2018-03-03 ENCOUNTER — Telehealth: Payer: Self-pay | Admitting: Internal Medicine

## 2018-03-03 NOTE — Telephone Encounter (Signed)
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 I advised him that if he needs to be seen on time to make apt for wt check at 8:30a  Or 2 pm so he is seen on time. Or he could ask E[facility]i about just having MA visit to get weighted and they can send me that record. I did explain to him, if he is going to have a lot of questions, is best he makes an appt. He was thankful for my call and will call back for apt.

## 2018-04-07 ENCOUNTER — Other Ambulatory Visit: Payer: Self-pay | Admitting: Nurse Practitioner

## 2018-06-10 ENCOUNTER — Telehealth: Payer: Self-pay

## 2018-06-10 NOTE — Telephone Encounter (Signed)
Called to schedule pt appt that he missed Jan 20/20 pt stated he did not wish to schedule an appt at this time, he stated he is doing fine 06/10/18

## 2018-08-01 ENCOUNTER — Other Ambulatory Visit: Payer: Self-pay | Admitting: Nurse Practitioner

## 2018-08-19 ENCOUNTER — Other Ambulatory Visit: Payer: Self-pay | Admitting: Internal Medicine

## 2018-08-19 DIAGNOSIS — Z20822 Contact with and (suspected) exposure to covid-19: Secondary | ICD-10-CM

## 2018-08-23 LAB — NOVEL CORONAVIRUS, NAA: SARS-CoV-2, NAA: NOT DETECTED

## 2018-08-26 ENCOUNTER — Other Ambulatory Visit: Payer: Self-pay | Admitting: Internal Medicine

## 2018-09-03 ENCOUNTER — Other Ambulatory Visit: Payer: Self-pay

## 2018-09-03 DIAGNOSIS — I10 Essential (primary) hypertension: Secondary | ICD-10-CM

## 2018-09-03 MED ORDER — LISINOPRIL-HYDROCHLOROTHIAZIDE 10-12.5 MG PO TABS: 1.0000 | ORAL_TABLET | Freq: Every day | ORAL | 0 refills | Status: DC

## 2018-09-03 NOTE — Telephone Encounter (Signed)
Called pt to schedule appt. Refill quantity 10 sent to pharm. 90 will be sent after completion of next schedule visit on 09/08/2018

## 2018-09-08 ENCOUNTER — Other Ambulatory Visit: Payer: Self-pay

## 2018-09-08 ENCOUNTER — Encounter: Payer: Self-pay | Admitting: Nurse Practitioner

## 2018-09-08 ENCOUNTER — Ambulatory Visit: Payer: Self-pay | Admitting: Nurse Practitioner

## 2018-09-08 VITALS — BP 136/88 | HR 90 | Temp 98.2°F | Ht 73.6 in | Wt 281.0 lb

## 2018-09-08 DIAGNOSIS — I10 Essential (primary) hypertension: Secondary | ICD-10-CM

## 2018-09-08 DIAGNOSIS — R7303 Prediabetes: Secondary | ICD-10-CM

## 2018-09-08 MED ORDER — LISINOPRIL-HYDROCHLOROTHIAZIDE 10-12.5 MG PO TABS: 1.0000 | ORAL_TABLET | Freq: Every day | ORAL | 1 refills | Status: DC

## 2018-09-08 NOTE — Progress Notes (Signed)
Subjective:     Patient ID: Timothy Newton , male    DOB: [birth year] , 58 y.o.   MRN: [id number]   Chief Complaint  Patient presents with  . Hypertension    HPI  Hypertension This is a chronic problem. The current episode started more than 1 year ago. The problem is unchanged. The problem is uncontrolled. Pertinent negatives include no anxiety, chest pain, headaches or palpitations. There are no associated agents to hypertension. Risk factors for coronary artery disease include male gender and sedentary lifestyle. Past treatments include diuretics and ACE inhibitors.     Past Medical History:  Diagnosis Date  . Anemia   . Bleeding gastric ulcer   . Blood transfusion   . H. pylori infection 2006   tx 14 d Nexium, amoxicillin and clarithromycin  . Hypertension   . MALT lymphoma ([place]) 2006   s/p 14 days Nexium, clarithromycin and amoxicillin  . Neuromuscular disorder ([place])      Family History  Problem Relation Age of Onset  . Colon cancer Neg Hx   . Diabetes Neg Hx   . Heart disease Neg Hx      Current Outpatient Medications:  .  lisinopril-hydrochlorothiazide (ZESTORETIC) 10-12.5 MG tablet, Take 1 tablet by mouth daily. Pt will get #90 after he comp next scheduled visit, Disp: 10 tablet, Rfl: 0   No Known Allergies   Review of Systems  Constitutional: Negative.   Respiratory: Negative.   Cardiovascular: Negative for chest pain, palpitations and leg swelling.  Neurological: Negative for dizziness and headaches.     Today's Vitals   09/08/18 1139  BP: 136/88  Pulse: 90  Temp: 98.2 F (36.8 C)  TempSrc: Oral  Weight: 281 lb (127.5 kg)  Height: 6' 1.6" (1.869 m)  PainSc: 0-No pain   Body mass index is 36.47 kg/m.   Objective:  Physical Exam Constitutional:      General: He is not in acute distress.    Appearance: Normal appearance. He is obese.  Cardiovascular:     Rate and Rhythm: Normal rate and regular rhythm.     Pulses: Normal pulses.   Heart sounds: Normal heart sounds. No murmur.  Pulmonary:     Effort: Pulmonary effort is normal.     Breath sounds: Normal breath sounds.  Skin:    Capillary Refill: Capillary refill takes less than 2 seconds.  Neurological:     General: No focal deficit present.     Mental Status: He is alert and oriented to person, place, and time.  Psychiatric:        Mood and Affect: Mood normal.        Behavior: Behavior normal.        Thought Content: Thought content normal.        Judgment: Judgment normal.          Assessment And Plan:     1. Essential hypertension . B/P is fairly controlled.  . CMP ordered to check renal function.  . The importance of regular exercise and dietary modification was stressed to the patient.   - BMP8+eGFR - Lipid Profile - lisinopril-hydrochlorothiazide (ZESTORETIC) 10-12.5 MG tablet; Take 1 tablet by mouth daily.  Dispense: 90 tablet; Refill: 1  2. Prediabetes  Chronic, stable  No current medications  Encouraged to limit intake of sugary foods and drinks  Encouraged to increase physical activity to 150 minutes per week - BMP8+eGFR - Hemoglobin A1c   Minette Brine, FNP    THE PATIENT IS ENCOURAGED  TO PRACTICE SOCIAL DISTANCING DUE TO THE COVID-19 PANDEMIC.

## 2018-09-09 LAB — LIPID PANEL
Chol/HDL Ratio: 5.7 ratio — ABNORMAL HIGH (ref 0.0–5.0)
Cholesterol, Total: 194 mg/dL (ref 100–199)
HDL: 34 mg/dL — ABNORMAL LOW (ref >39)
LDL Calculated: 130 mg/dL — ABNORMAL HIGH (ref 0–99)
Triglycerides: 150 mg/dL — ABNORMAL HIGH (ref 0–149)
VLDL Cholesterol Cal: 30 mg/dL (ref 5–40)

## 2018-09-09 LAB — BMP8+EGFR
BUN/Creatinine Ratio: 10 (ref 9–20)
BUN: 11 mg/dL (ref 6–24)
CO2: 22 mmol/L (ref 20–29)
Calcium: 10.1 mg/dL (ref 8.7–10.2)
Chloride: 101 mmol/L (ref 96–106)
Creatinine, Ser: 1.09 mg/dL (ref 0.76–1.27)
GFR calc Af Amer: 87 mL/min/{1.73_m2} (ref >59)
GFR calc non Af Amer: 75 mL/min/{1.73_m2} (ref >59)
Glucose: 149 mg/dL — ABNORMAL HIGH (ref 65–99)
Potassium: 3.7 mmol/L (ref 3.5–5.2)
Sodium: 139 mmol/L (ref 134–144)

## 2018-09-09 LAB — HEMOGLOBIN A1C
Est. average glucose Bld gHb Est-mCnc: 114 mg/dL
Hgb A1c MFr Bld: 5.6 % (ref 4.8–5.6)

## 2019-02-08 ENCOUNTER — Other Ambulatory Visit: Payer: Self-pay

## 2019-02-08 ENCOUNTER — Encounter: Payer: Self-pay | Admitting: Nurse Practitioner

## 2019-02-08 ENCOUNTER — Telehealth: Payer: Self-pay

## 2019-02-08 ENCOUNTER — Telehealth (INDEPENDENT_AMBULATORY_CARE_PROVIDER_SITE_OTHER): Payer: 59 | Admitting: Nurse Practitioner

## 2019-02-08 VITALS — Ht 73.0 in | Wt 273.0 lb

## 2019-02-08 DIAGNOSIS — R509 Fever, unspecified: Secondary | ICD-10-CM | POA: Diagnosis not present

## 2019-02-08 DIAGNOSIS — B342 Coronavirus infection, unspecified: Secondary | ICD-10-CM

## 2019-02-08 DIAGNOSIS — R05 Cough: Secondary | ICD-10-CM | POA: Diagnosis not present

## 2019-02-08 DIAGNOSIS — U071 COVID-19: Secondary | ICD-10-CM | POA: Diagnosis not present

## 2019-02-08 DIAGNOSIS — R059 Cough, unspecified: Secondary | ICD-10-CM

## 2019-02-08 MED ORDER — AZITHROMYCIN 250 MG PO TABS: ORAL_TABLET | ORAL | 0 refills | Status: AC

## 2019-02-08 MED ORDER — HYDROCODONE-HOMATROPINE 5-1.5 MG/5ML PO SYRP: 5.0000 mL | ORAL_SOLUTION | Freq: Four times a day (QID) | ORAL | 0 refills | Status: DC | PRN

## 2019-02-08 NOTE — Patient Instructions (Signed)
10 Things You Can Do to Manage Your COVID-19 Symptoms at Home If you have possible or confirmed COVID-19: 1. Stay home from work and school. And stay away from other public places. If you must go out, avoid using any kind of public transportation, ridesharing, or taxis. 2. Monitor your symptoms carefully. If your symptoms get worse, call your healthcare provider immediately. 3. Get rest and stay hydrated. 4. If you have a medical appointment, call the healthcare provider ahead of time and tell them that you have or may have COVID-19. 5. For medical emergencies, call 911 and notify the dispatch personnel that you have or may have COVID-19. 6. Cover your cough and sneezes with a tissue or use the inside of your elbow. 7. Wash your hands often with soap and water for at least 20 seconds or clean your hands with an alcohol-based hand sanitizer that contains at least 60% alcohol. 8. As much as possible, stay in a specific room and away from other people in your home. Also, you should use a separate bathroom, if available. If you need to be around other people in or outside of the home, wear a mask. 9. Avoid sharing personal items with other people in your household, like dishes, towels, and bedding. 10. Clean all surfaces that are touched often, like counters, tabletops, and doorknobs. Use household cleaning sprays or wipes according to the label instructions. cdc.gov/coronavirus 08/05/2018 This information is not intended to replace advice given to you by your health care provider. Make sure you discuss any questions you have with your health care provider. Document Revised: 01/07/2019 Document Reviewed: 01/07/2019 Elsevier Patient Education  2020 Elsevier Inc.  

## 2019-02-08 NOTE — Telephone Encounter (Signed)
PT CALLED STATING HE TESTED POSITIVE FOR COVID19 AND NEED APPT BUT QUESTIONED IF HE WOULD BE PRESCRIBED A COUGH RX ADVISED THAT I DID NOT KNOW THAT HE WOULD NEED TO SCHEDULE VIRTUAL VISIT TO DISCUSS WITH PROVIDER.PT DECLINED ORIGINALLY THEN CALLED BACK STATED THAT HE WOULD LIKE APPT. VISIT SCHEDULED

## 2019-02-08 NOTE — Progress Notes (Signed)
Virtual Visit via Failed Video   This visit type was conducted due to national recommendations for restrictions regarding the COVID-19 Pandemic (e.g. social distancing) in an effort to limit this patient's exposure and mitigate transmission in our community.  Due to his co-morbid illnesses, this patient is at least at moderate risk for complications without adequate follow up.  This format is felt to be most appropriate for this patient at this time.  All issues noted in this document were discussed and addressed.  A limited physical exam was performed with this format.    This visit type was conducted due to national recommendations for restrictions regarding the COVID-19 Pandemic (e.g. social distancing) in an effort to limit this patient's exposure and mitigate transmission in our community.  Patients identity confirmed using two different identifiers.  This format is felt to be most appropriate for this patient at this time.  All issues noted in this document were discussed and addressed.  No physical exam was performed (except for noted visual exam findings with Video Visits).    Date:  02/08/2019   ID:  Timothy Newton, DOB 1960-05-30, MRN [id number]  Patient Location:  Home - spoke with Timothy Newton  Provider location:   Office    Chief Complaint:  Positive covid  History of Present Illness:    Timothy Newton is a 59 y.o. male who presents via video conferencing for a telehealth visit today.    The patient does not have symptoms concerning for COVID-19 infection (fever, chills, cough, or new shortness of breath).   He was tested for coronavirus on 02/04/2019 - he had a fever, more sleepy (2-3 hours); this was odd for him. He is having an intermittent cough with what he feels like "phlegm breaking".  No other family members in th house are ill.  Fever has ranged from 99-101 since Christmas eve.  Tylenol as needed.  Denies any aches and pain.       Past Medical  History:  Diagnosis Date  . Anemia   . Bleeding gastric ulcer   . Blood transfusion   . H. pylori infection 2006   tx 14 d Nexium, amoxicillin and clarithromycin  . Hypertension   . 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 Neuromuscular disorder [facility])    Past Surgical History:  Procedure Laterality Date  . UPPER GASTROINTESTINAL ENDOSCOPY  2006, 2013     Current Meds  Medication Sig  . lisinopril-hydrochlorothiazide (ZESTORETIC) 10-12.5 MG tablet Take 1 tablet by mouth daily.     Allergies:   Patient has no known allergies.   Social History   Tobacco Use  . Smoking status: Former Smoker    Packs/day: 1.00    Years: 5.00    Pack years: 5.00    Types: Cigarettes    Quit date: 03/18/2001    Years since quitting: 17.9  . Smokeless tobacco: Former Network engineer Use Topics  . Alcohol use: No  . Drug use: No     Family Hx: The patient's family history is negative for Colon cancer, Diabetes, and Heart disease.  ROS:   Please see the history of present illness.    Review of Systems  Constitutional: Negative.   Respiratory: Positive for cough (intermittent).   Cardiovascular: Negative.   Musculoskeletal: Negative.   Neurological: Negative.  Negative for dizziness and tingling.  Psychiatric/Behavioral: Negative.     All other systems reviewed and are negative.   Labs/Other Tests  and Data Reviewed:    Recent Labs: 09/08/2018: BUN 11; Creatinine, Ser 1.09; Potassium 3.7; Sodium 139   Recent Lipid Panel Lab Results  Component Value Date/Time   CHOL 194 09/08/2018 12:31 PM   TRIG 150 (H) 09/08/2018 12:31 PM   HDL 34 (L) 09/08/2018 12:31 PM   CHOLHDL 5.7 (H) 09/08/2018 12:31 PM   LDLCALC 130 (H) 09/08/2018 12:31 PM    Wt Readings from Last 3 Encounters:  02/08/19 273 lb (123.8 kg)  09/08/18 281 lb (127.5 kg)  02/17/18 284 lb 9.6 oz (129.1 kg)     Exam:    Vital Signs:  Ht 6\' 1"  (1.854 m)   Wt 273 lb (123.8 kg)    BMI 36.02 kg/m     Physical Exam  Constitutional: He is oriented to person, place, and time and well-developed, well-nourished, and in no distress. No distress.  Pulmonary/Chest: Effort normal. No respiratory distress.  Neurological: He is alert and oriented to person, place, and time.  Psychiatric: Mood, memory, affect and judgment normal.    ASSESSMENT & PLAN:    1. Coronavirus infection  He tested [facility] for coronavirus [facility]/04/[facility]  2. Cough  Intermittent cough, will also treat with azithromycin due to the length of time with a fever.   - azithromycin (ZITHROMAX) 250 MG tablet; Take 2 tablets (500 mg) on  Day 1,  followed by 1 tablet (250 mg) once daily on Days 2 through 5.  Dispense: 6 each; Refill: 0 - HYDROcodone-homatropine (HYDROMET) 5-1.5 MG/5ML syrup; Take 5 mLs by mouth every 6 (six) hours as needed.  Dispense: 120 mL; Refill: 0  COVID-19 Education: The signs and symptoms of COVID-19 were discussed with the patient and how to seek care for testing (follow up with PCP or arrange E-visit).  The importance of social distancing was discussed today.  Patient Risk:   After full review of this patients clinical status, I feel that they are at least moderate risk at this time.  Time:   Today, I have spent 12 minutes/ seconds with the patient with telehealth technology discussing above diagnoses.     Medication Adjustments/Labs and Tests Ordered: Current medicines are reviewed at length with the patient today.  Concerns regarding medicines are outlined above.   Tests Ordered: No orders of the defined types were placed in this encounter.   Medication Changes: No orders of the defined types were placed in this encounter.   Disposition:  Follow up prn  Signed, Minette Brine, FNP

## 2019-02-16 ENCOUNTER — Telehealth: Payer: Self-pay

## 2019-02-16 NOTE — Telephone Encounter (Signed)
Patient called wanting to know what are his next steps he has finished his antibiotics that he was prescribed for covid. I advised him that on day 14 he can schedule an appt with [facility] or [facility] to be [facility]ested if they'll retest him if not once he is past day 14 and is symptom free he is ok to come out of quarantine. YRL,RMA

## 2019-03-05 ENCOUNTER — Other Ambulatory Visit: Payer: Self-pay | Admitting: Nurse Practitioner

## 2019-03-05 DIAGNOSIS — I10 Essential (primary) hypertension: Secondary | ICD-10-CM

## 2019-03-29 ENCOUNTER — Telehealth: Payer: Self-pay

## 2019-03-29 ENCOUNTER — Other Ambulatory Visit: Payer: Self-pay

## 2019-03-29 NOTE — Telephone Encounter (Signed)
PT CALLED REQ [facility] AND TO BE SENT TO [facility] AT [place].Marland KitchenINFO GIVEN T.BADGETT TO HANDLE

## 2019-03-31 ENCOUNTER — Ambulatory Visit (INDEPENDENT_AMBULATORY_CARE_PROVIDER_SITE_OTHER): Payer: 59 | Admitting: Internal Medicine

## 2019-03-31 ENCOUNTER — Other Ambulatory Visit: Payer: Self-pay

## 2019-03-31 VITALS — BP 126/82 | HR 87 | Temp 98.4°F | Ht 73.0 in | Wt 281.0 lb

## 2019-03-31 DIAGNOSIS — E669 Obesity, unspecified: Secondary | ICD-10-CM

## 2019-03-31 DIAGNOSIS — R7303 Prediabetes: Secondary | ICD-10-CM | POA: Diagnosis not present

## 2019-03-31 DIAGNOSIS — R37 Sexual dysfunction, unspecified: Secondary | ICD-10-CM | POA: Diagnosis not present

## 2019-03-31 DIAGNOSIS — I10 Essential (primary) hypertension: Secondary | ICD-10-CM | POA: Insufficient documentation

## 2019-03-31 DIAGNOSIS — C884 Extranodal marginal zone B-cell lymphoma of mucosa-associated lymphoid tissue [MALT-lymphoma]: Secondary | ICD-10-CM

## 2019-03-31 DIAGNOSIS — N528 Other male erectile dysfunction: Secondary | ICD-10-CM

## 2019-03-31 DIAGNOSIS — Z6837 Body mass index (BMI) 37.0-37.9, adult: Secondary | ICD-10-CM

## 2019-03-31 DIAGNOSIS — E782 Mixed hyperlipidemia: Secondary | ICD-10-CM

## 2019-03-31 DIAGNOSIS — N529 Male erectile dysfunction, unspecified: Secondary | ICD-10-CM | POA: Insufficient documentation

## 2019-03-31 NOTE — Progress Notes (Signed)
This visit occurred during the SARS-CoV-2 public health emergency.  Safety protocols were in place, including screening questions prior to the visit, additional usage of staff PPE, and extensive cleaning of exam room while observing appropriate contact time as indicated for disinfecting solutions.  Subjective:     Patient ID: Timothy Newton , male    DOB: Oct 20, 1960 , 59 y.o.   MRN: [id number]   Chief Complaint  Patient presents with  . Discuss medication    HPI Pt is interested in ED medication. He tried Cialis in the past but did not help him and was told he was probably on a low dose. Would like the highest dose. He has never had his testosterone checked since dealing with sexual dysfunction.  Hx of ED x 3 year.    Past Medical History:  Diagnosis Date  . Anemia   . Bleeding gastric ulcer   . Blood transfusion   . H. pylori infection 2006   tx 14 d Nexium, amoxicillin and clarithromycin  . Hypertension   . 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 Neuromuscular disorder ([place])      Family History  Problem Relation Age of Onset  . Colon cancer Neg Hx   . Diabetes Neg Hx   . Heart disease Neg Hx      Current Outpatient Medications:  .  lisinopril-hydrochlorothiazide (ZESTORETIC) 10-12.5 MG tablet, TAKE 1 TABLET BY MOUTH EVERY DAY, Disp: 90 tablet, Rfl: 0   No Known Allergies   Review of Systems  + for low libido, impotency, the rest of 10 point ROS is neg.  Today's Vitals   03/31/19 1638  BP: 126/82  Pulse: 87  Temp: 98.4 F (36.9 C)  TempSrc: Oral  Weight: 281 lb (127.5 kg)  Height: 6\' 1"  (1.854 m)   Body mass index is 37.07 kg/m.   Objective:  Physical Exam  Constitutional: She is oriented to person, place, and time. She appears well-developed and well-nourished. No distress.  HENT:  Head: Normocephalic and atraumatic.  Right Ear: External ear normal.  Left Ear: External ear normal.  Nose: Nose normal.   Eyes: Conjunctivae are normal. Right eye exhibits no discharge. Left eye exhibits no discharge. No scleral icterus.  Neck: Neck supple. No thyromegaly present.  No carotid bruits bilaterally  Cardiovascular: Normal rate and regular rhythm.  No murmur heard. Pulmonary/Chest: Effort normal and breath sounds normal. No respiratory distress.  Musculoskeletal: Normal range of motion. She exhibits no edema.  Lymphadenopathy:    She has no cervical adenopathy.  Neurological: She is alert and oriented to person, place, and time.  Skin: Skin is warm and dry. Capillary refill takes less than 2 seconds. No rash noted. She is not diaphoretic.  Psychiatric: She has a normal mood and affect. Her behavior is normal. Judgment and thought content normal.  Nursing note reviewed. EKG- sinus rhythm, WNL Assessment And Plan:     1. Essential hypertension- stable. May continue same medication. FU 3 months.  - CMP14 + Anion Gap  2. History of  Prediabetes- chronic. Is watching his diet on low carb diet.  - Hemoglobin A1c  3. Obesity (BMI 35.0-39.9 without comorbidity)- chronic. Unchanged  4. Sexual dysfunction- chronic - Testosterone, Total - TSH  5. Mixed hyperlipidemia- with elevated LDL and triglycerides which improved last time from prior one, but still not normal.  - Lipid panel - TSH - T4, Free - T3, free - EKG 12-Lead   Pt  got very upset I was ordering multiple tests in order to prescribe Cialis and said he felt I was " milking him". He said he felt I was not listening to him. I reminded him, that he complained to me exactly about the same thing from the prior provider he saw and that is why he was seeing me at that time.  I spent 20 minutes explaining why I needed those test ordered ( besides being part of his chronic conditions)before I would prescribe Cialis, but if he did not want them, he could go see a urologist. He changed his mind at the end of our conversation and decided he would  proceed with those test and come back tomorrow am, since the lab is closed now and he was not fasting.  Santosha Jividen RODRIGUEZ-SOUTHWORTH, PA-C    THE PATIENT IS ENCOURAGED TO PRACTICE SOCIAL DISTANCING DUE TO THE COVID-19 PANDEMIC.

## 2019-04-05 ENCOUNTER — Other Ambulatory Visit: Payer: 59

## 2019-04-05 ENCOUNTER — Other Ambulatory Visit: Payer: Self-pay | Admitting: Internal Medicine

## 2019-04-07 LAB — TESTOSTERONE,FREE AND TOTAL
Testosterone, Free: 9.8 pg/mL (ref 7.2–24.0)
Testosterone: 360 ng/dL (ref 264–916)

## 2019-04-07 LAB — LIPID PANEL
Chol/HDL Ratio: 5.4 ratio — ABNORMAL HIGH (ref 0.0–5.0)
Cholesterol, Total: 174 mg/dL (ref 100–199)
HDL: 32 mg/dL — ABNORMAL LOW (ref >39)
LDL Chol Calc (NIH): 123 mg/dL — ABNORMAL HIGH (ref 0–99)
Triglycerides: 103 mg/dL (ref 0–149)
VLDL Cholesterol Cal: 19 mg/dL (ref 5–40)

## 2019-04-08 ENCOUNTER — Other Ambulatory Visit: Payer: 59

## 2019-04-08 ENCOUNTER — Other Ambulatory Visit: Payer: Self-pay

## 2019-04-08 ENCOUNTER — Other Ambulatory Visit: Payer: Self-pay | Admitting: Internal Medicine

## 2019-04-08 NOTE — Progress Notes (Signed)
Please inform him that his bad cholesterol LDL is a little better than prior time, his testosterone level is in the low normal. To help his low normal testosterone he should do weight lifting. For the LDL, if his prediabetes has resolved then the low fat diet and high fiber diet and exercise can continue bringing the LDL down. But if it is worse, he may need to be on cholesterol medication.  I have not received the rest of the lab results and need to know how his prediabetes test, and liver and kidney function. Specially I need to know what his kidney and liver  functions are normal in order to dose the medication properly.  Did he decline getting the other tests done?

## 2019-04-09 LAB — HEMOGLOBIN A1C
Est. average glucose Bld gHb Est-mCnc: 120 mg/dL
Hgb A1c MFr Bld: 5.8 % — ABNORMAL HIGH (ref 4.8–5.6)

## 2019-04-09 LAB — CBC
Hematocrit: 43.8 % (ref 37.5–51.0)
Hemoglobin: 14.5 g/dL (ref 13.0–17.7)
MCH: 29.8 pg (ref 26.6–33.0)
MCHC: 33.1 g/dL (ref 31.5–35.7)
MCV: 90 fL (ref 79–97)
Platelets: 208 10*3/uL (ref 150–450)
RBC: 4.87 x10E6/uL (ref 4.14–5.80)
RDW: 11.3 % — ABNORMAL LOW (ref 11.6–15.4)
WBC: 5.4 10*3/uL (ref 3.4–10.8)

## 2019-04-13 ENCOUNTER — Telehealth: Payer: Self-pay

## 2019-04-13 NOTE — Telephone Encounter (Signed)
[facility] said you added labs they need your signature to release the labs. They are faxing a form over today for that if you are somewhere I can fax it to you or you can get them to send it over to you

## 2019-04-16 ENCOUNTER — Other Ambulatory Visit: Payer: Self-pay | Admitting: Internal Medicine

## 2019-04-16 MED ORDER — TADALAFIL 20 MG PO TABS: 20.0000 mg | ORAL_TABLET | Freq: Every day | ORAL | 1 refills | Status: DC | PRN

## 2019-04-22 LAB — CMP14 + ANION GAP
ALT: 18 IU/L (ref 0–44)
AST: 22 IU/L (ref 0–40)
Albumin/Globulin Ratio: 2 (ref 1.2–2.2)
Albumin: 4.8 g/dL (ref 3.8–4.9)
Alkaline Phosphatase: 67 IU/L (ref 39–117)
Anion Gap: 18 mmol/L (ref 10.0–18.0)
BUN/Creatinine Ratio: 8 — ABNORMAL LOW (ref 9–20)
BUN: 8 mg/dL (ref 6–24)
Bilirubin Total: 1.1 mg/dL (ref 0.0–1.2)
CO2: 22 mmol/L (ref 20–29)
Calcium: 10.2 mg/dL (ref 8.7–10.2)
Chloride: 101 mmol/L (ref 96–106)
Creatinine, Ser: 1.04 mg/dL (ref 0.76–1.27)
GFR calc Af Amer: 92 mL/min/{1.73_m2} (ref >59)
GFR calc non Af Amer: 79 mL/min/{1.73_m2} (ref >59)
Globulin, Total: 2.4 g/dL (ref 1.5–4.5)
Glucose: 117 mg/dL — ABNORMAL HIGH (ref 65–99)
Potassium: 3.5 mmol/L (ref 3.5–5.2)
Sodium: 141 mmol/L (ref 134–144)
Total Protein: 7.2 g/dL (ref 6.0–8.5)

## 2019-04-22 LAB — INSULIN, RANDOM: INSULIN: 19.2 u[IU]/mL (ref 2.6–24.9)

## 2019-04-22 LAB — SPECIMEN STATUS REPORT

## 2019-04-22 LAB — TSH: TSH: 1.13 u[IU]/mL (ref 0.450–4.500)

## 2019-05-12 ENCOUNTER — Ambulatory Visit (HOSPITAL_COMMUNITY)
Admission: EM | Admit: 2019-05-12 | Discharge: 2019-05-12 | Disposition: A | Discharge status: Home / Self Care | Payer: 59 | Attending: Family Medicine | Admitting: Family Medicine

## 2019-05-12 ENCOUNTER — Telehealth: Payer: Self-pay

## 2019-05-12 ENCOUNTER — Encounter (HOSPITAL_COMMUNITY): Payer: Self-pay

## 2019-05-12 ENCOUNTER — Other Ambulatory Visit: Payer: Self-pay

## 2019-05-12 DIAGNOSIS — I10 Essential (primary) hypertension: Secondary | ICD-10-CM | POA: Diagnosis not present

## 2019-05-12 MED ORDER — AMLODIPINE BESYLATE 5 MG PO TABS: 5.0000 mg | ORAL_TABLET | Freq: Every day | ORAL | 0 refills | Status: DC

## 2019-05-12 NOTE — ED Provider Notes (Signed)
[place]    CSN: [id number] Arrival date & time: 05/12/19  1641      History   Chief Complaint Chief Complaint  Patient presents with  . Chest Pain    HPI Horris Busto is a 59 y.o. male.   Presenting with elevated blood pressure.  He is currently on lisinopril and hydrochlorothiazide.  Most recently his blood pressure was 188/111.  He denies any slurred speech or drooping of his face.  No numbness on one side of his body.  No chest pain or shortness of breath.  Has been eating more fast food recently.  His blood pressure usually ranges in the 120 over 80s range.  HPI  Past Medical History:  Diagnosis Date  . Anemia   . Bleeding gastric ulcer   . Blood transfusion   . H. pylori infection 2006   tx 14 d Nexium, amoxicillin and clarithromycin  . Hypertension   . 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 Neuromuscular disorder [facility])     Patient Active Problem List   Diagnosis Date Noted  . Essential hypertension   . Erectile dysfunction   . 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 Gastric ulcer, acute with hemorrhage 03/26/2011  . Acute posthemorrhagic anemia 03/26/2011    Past Surgical History:  Procedure Laterality Date  . UPPER GASTROINTESTINAL ENDOSCOPY  2006, 2013       Home Medications    Prior to Admission medications   Medication Sig Start Date End Date Taking? Authorizing Provider  amLODipine (NORVASC) 5 MG tablet Take 1 tablet (5 mg total) by mouth daily. 05/12/19   Rosemarie Ax, MD  lisinopril-hydrochlorothiazide (ZESTORETIC) 10-12.5 MG tablet TAKE 1 TABLET BY MOUTH EVERY DAY 03/05/19   Minette Brine, FNP  tadalafil (CIALIS) 20 MG tablet Take 1 tablet (20 mg total) by mouth daily as needed for erectile dysfunction. 04/16/19   Rodriguez-[place]worth, Sunday Spillers, PA-C    Family History Family History  Problem Relation Age of Onset  . Colon cancer Neg Hx   . Diabetes Neg Hx     . Heart disease Neg Hx     Social History Social History   Tobacco Use  . Smoking status: Former Smoker    Packs/day: 1.00    Years: 5.00    Pack years: 5.00    Types: Cigarettes    Quit date: 03/18/2001    Years since quitting: 18.1  . Smokeless tobacco: Former Network engineer Use Topics  . Alcohol use: No  . Drug use: No     Allergies   Patient has no known allergies.   Review of Systems Review of Systems See HPI  Physical Exam Triage Vital Signs ED Triage Vitals  Enc Vitals Group     BP 05/12/19 1710 (!) 154/88     Pulse Rate 05/12/19 1710 71     Resp 05/12/19 1710 20     Temp 05/12/19 1710 99.1 F (37.3 C)     Temp Source 05/12/19 1710 Oral     SpO2 05/12/19 1710 98 %     Weight --      Height --      Head Circumference --      Peak Flow --      Pain Score 05/12/19 1709 0     Pain Loc --      Pain Edu? --      Excl. in GC? --    No  data found.  Updated Vital Signs BP (!) 154/88 (BP Location: Right Arm)   Pulse 71   Temp 99.1 F (37.3 C) (Oral)   Resp 20   SpO2 98%   Visual Acuity Right Eye Distance:   Left Eye Distance:   Bilateral Distance:    Right Eye Near:   Left Eye Near:    Bilateral Near:     Physical Exam Gen: NAD, alert, cooperative with exam, well-appearing ENT: normal lips, normal nasal mucosa,  Eye: normal EOM, normal conjunctiva and lids CV:  no edema, +2 pedal pulses   Resp: no accessory muscle use, non-labored,     UC Treatments / Results  Labs (all labs ordered are listed, but only abnormal results are displayed) Labs Reviewed - No data to display  EKG   Radiology No results found.  Procedures Procedures (including critical care time)  Medications Ordered in UC Medications - No data to display  Initial Impression / Assessment and Plan / UC Course  I have reviewed the triage vital signs and the nursing notes.  Pertinent labs & imaging results that were available during my care of the patient were  reviewed by me and considered in my medical decision making (see chart for details).     Mr. Zombro is a 59 year old male that is presenting with elevated blood pressure.  He has a history of essential hypertension.  Most recently is been more elevated.  Could be related to his recent eating habits.  He denies any symptoms and no chest pain today.  Provided amlodipine for possible use if his blood pressure remains elevated on a daily basis.  Counseled on diet and nutrition.  Counseled on exercise.  Given indications to follow-up return.  Final Clinical Impressions(s) / UC Diagnoses   Final diagnoses:  Essential hypertension     Discharge Instructions     Please monitor your blood pressure at the same time on a daily basis  Please try to have a well balanced diet and continue exercises  Please follow up if your symptoms fail to improve or worsen.     ED Prescriptions    Medication Sig Dispense Auth. Provider   amLODipine (NORVASC) 5 MG tablet Take 1 tablet (5 mg total) by mouth daily. 30 tablet Rosemarie Ax, MD     PDMP not reviewed this encounter.   Rosemarie Ax, MD 05/12/19 1754

## 2019-05-12 NOTE — ED Triage Notes (Signed)
Pt reports his BP was 188/111 yesterday at the dentist office, he tried to go to his PCP, they sent him here. Pt denies any simptoms.

## 2019-05-12 NOTE — Telephone Encounter (Signed)
Returned call to pt about blood pressure. Advised him to go to urgent care due to our office not having any appointments available

## 2019-05-12 NOTE — Discharge Instructions (Signed)
Please monitor your blood pressure at the same time on a daily basis  Please try to have a well balanced diet and continue exercises  Please follow up if your symptoms fail to improve or worsen.

## 2019-05-20 ENCOUNTER — Ambulatory Visit (INDEPENDENT_AMBULATORY_CARE_PROVIDER_SITE_OTHER): Payer: 59 | Admitting: Nurse Practitioner

## 2019-05-20 ENCOUNTER — Encounter: Payer: Self-pay | Admitting: Nurse Practitioner

## 2019-05-20 ENCOUNTER — Other Ambulatory Visit: Payer: Self-pay

## 2019-05-20 VITALS — BP 140/88 | HR 83 | Temp 98.2°F | Ht 72.6 in | Wt 284.6 lb

## 2019-05-20 DIAGNOSIS — Z01818 Encounter for other preprocedural examination: Secondary | ICD-10-CM | POA: Diagnosis not present

## 2019-05-20 DIAGNOSIS — I10 Essential (primary) hypertension: Secondary | ICD-10-CM | POA: Diagnosis not present

## 2019-05-20 MED ORDER — AMLODIPINE BESYLATE 5 MG PO TABS: 5.0000 mg | ORAL_TABLET | Freq: Every day | ORAL | 2 refills | Status: DC

## 2019-05-20 NOTE — Progress Notes (Signed)
This visit occurred during the SARS-CoV-2 public health emergency. Safety protocols were in place, including screening questions prior to the visit, additional usage of staff PPE, and extensive cleaning of exam room while observing appropriate contact time as indicated for disinfecting solutions.  Subjective:     Patient ID: Timothy Newton , male    DOB: 1960-02-07 , 59 y.o.   MRN: [id number]      Chief Complaint  Patient presents with  . Medical Clearance    HPI  Here for medical clearance for dental treatment when he went to the dentist on 4/5 and 4/6 his blood pressure was 138/91, on 4/6 was 169/111, 147/97 after 10 minutes and 184/118 10 minutes later.    He had been Fasting for Malena Edman and restarted eating and ate 3 hamburgers. He was seen at the ER on 4/7 which his blood pressure was still elevated but better - the ER provider started him on amlodipine thought to be for one month, tolerating well.   He continues lisinopril/HCTZ.  Blood pressure ranges at home 134/80  The best he has seen was 119/72 at 3am one morning.   Overall he feels like he is doing well without any symptoms.         Past Medical History:  Diagnosis Date  . Anemia   . Bleeding gastric ulcer   . Blood transfusion   . H. pylori infection 2006   tx 14 d Nexium, amoxicillin and clarithromycin  . Hypertension   . 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 Neuromuscular disorder ([place])           Family History  Problem Relation Age of Onset  . Colon cancer Neg Hx   . Diabetes Neg Hx   . Heart disease Neg Hx      Current Outpatient Medications:  .  amLODipine (NORVASC) 5 MG tablet, Take 1 tablet (5 mg total) by mouth daily., Disp: 30 tablet, Rfl: 0 .  lisinopril-hydrochlorothiazide (ZESTORETIC) 10-12.5 MG tablet, TAKE 1 TABLET BY MOUTH EVERY DAY, Disp: 90 tablet, Rfl: 0 .  tadalafil (CIALIS) 20 MG tablet, Take 1 tablet (20 mg total) by  mouth daily as needed for erectile dysfunction., Disp: 10 tablet, Rfl: 1   No Known Allergies   Review of Systems  Constitutional: Negative.   Respiratory: Negative.   Cardiovascular: Negative.  Negative for chest pain, palpitations and leg swelling.  Neurological: Negative for dizziness and headaches.  Psychiatric/Behavioral: Negative.         Today's Vitals   05/20/19 0950  BP: 140/88  Pulse: 83  Temp: 98.2 F (36.8 C)  TempSrc: Oral  Weight: 284 lb 9.6 oz (129.1 kg)  Height: 6' 0.6" (1.844 m)   Body mass index is 37.96 kg/m.   Objective:  Physical Exam Constitutional:      Appearance: Normal appearance.  Cardiovascular:     Rate and Rhythm: Normal rate and regular rhythm.     Pulses: Normal pulses.     Heart sounds: Normal heart sounds. No murmur.  Pulmonary:     Effort: Pulmonary effort is normal. No respiratory distress.     Breath sounds: Normal breath sounds.  Musculoskeletal:     Right lower leg: No edema.     Left lower leg: No edema.  Skin:    Capillary Refill: Capillary refill takes less than 2 seconds.  Neurological:     General: No focal deficit present.     Mental Status:  He is alert and oriented to person, place, and time.  Psychiatric:        Mood and Affect: Mood normal.        Behavior: Behavior normal.        Thought Content: Thought content normal.        Judgment: Judgment normal.       Assessment And Plan:     1. Essential hypertension  He is here today for medical clearance for the dentist after having multiple elevated blood pressures the elevation may be related to re-incorporating burgers after doing a Fast.    He is to continue the amlodipine 5 mg daily as his blood pressure has improved since taking  He can have dental treatment with no restrictions  He has also asked about another provider for primary care similar to our practice to refer I will get back with him on this information - amLODipine (NORVASC) 5 MG tablet;  Take 1 tablet (5 mg total) by mouth daily.  Dispense: 30 tablet; Refill: 2   Minette Brine, FNP    THE PATIENT IS ENCOURAGED TO PRACTICE SOCIAL DISTANCING DUE TO THE COVID-19 PANDEMIC.

## 2019-06-10 ENCOUNTER — Other Ambulatory Visit: Payer: Self-pay | Admitting: Family Medicine

## 2019-06-10 DIAGNOSIS — I10 Essential (primary) hypertension: Secondary | ICD-10-CM

## 2019-07-07 ENCOUNTER — Other Ambulatory Visit: Payer: Self-pay | Admitting: Nurse Practitioner

## 2019-07-07 DIAGNOSIS — I10 Essential (primary) hypertension: Secondary | ICD-10-CM

## 2020-04-13 ENCOUNTER — Ambulatory Visit: Payer: 59 | Admitting: Sports Medicine

## 2021-03-30 ENCOUNTER — Encounter: Payer: Self-pay | Admitting: Podiatry

## 2021-03-30 ENCOUNTER — Telehealth: Payer: Self-pay | Admitting: Podiatry

## 2021-03-30 ENCOUNTER — Ambulatory Visit: Payer: No Typology Code available for payment source

## 2021-03-30 ENCOUNTER — Other Ambulatory Visit: Payer: Self-pay

## 2021-03-30 ENCOUNTER — Ambulatory Visit (INDEPENDENT_AMBULATORY_CARE_PROVIDER_SITE_OTHER): Payer: No Typology Code available for payment source | Admitting: Podiatry

## 2021-03-30 DIAGNOSIS — M778 Other enthesopathies, not elsewhere classified: Secondary | ICD-10-CM

## 2021-03-30 DIAGNOSIS — M722 Plantar fascial fibromatosis: Secondary | ICD-10-CM

## 2021-03-30 DIAGNOSIS — Q828 Other specified congenital malformations of skin: Secondary | ICD-10-CM

## 2021-03-30 MED ORDER — TRIAMCINOLONE ACETONIDE 10 MG/ML IJ SUSP
10.0000 mg | Freq: Once | INTRAMUSCULAR | Status: AC
  Administered 2021-03-30: 10 mg

## 2021-03-30 NOTE — Telephone Encounter (Signed)
Spoke with the patient and told him that if symptoms persist to go to [facility] care and he said that he  took Benadryl, not helping,not having any shortness of breath but still itching all over, seems to start having hives on arms, feels as though skin is tightening.

## 2021-03-30 NOTE — Progress Notes (Signed)
Subjective:   Patient ID: Timothy Newton, male   DOB: 61 y.o.   MRN: [id number]   HPI Patient presents with pain underneath the left arch and lesion formation also present but inflammation within the tissue itself.  States its been there for around 4 to 6 months and patient does not smoke likes to be active   Review of Systems  All other systems reviewed and are negative.      Objective:  Physical Exam Vitals and nursing note reviewed.  Constitutional:      Appearance: He is well-developed.  Pulmonary:     Effort: Pulmonary effort is normal.  Musculoskeletal:        General: Normal range of motion.  Skin:    General: Skin is warm.  Neurological:     Mental Status: He is alert.    Neurovascular status intact with discomfort of the mid arch area left and several lesions also plantar aspect left with lucent course with patient found to have good digital perfusion well oriented x3     Assessment:  Neurovascular status intact muscle strength adequate range of motion within normal limits with patient found to have inflammation of the mid arch area left with fluid buildup and lesion formation x2 that is painful when pressed.     Plan:  Mid arch Planter fasciitis left along with probability for porokeratotic lesion formation x2.  I reviewed condition today I did sterile prep and injected the mid arch left 3 mg Kenalog 5 mg Xylocaine and I debrided lesions I advised this be seen back as needed hopefully this will solve the problem

## 2021-03-30 NOTE — Telephone Encounter (Addendum)
Returned call to patient to see how he was doing and if still having symptoms, said that he was and recommended that he go to ER or urgent care, giving him the name of the injection and mg administered. He verbalized understanding.

## 2021-03-30 NOTE — Telephone Encounter (Signed)
Patient is breaking out in hives and itching from the medication that Dr Paulla Dolly used on him today. He has taken Benadryl - advised patient that if he starts having trouble breathing or with swelling of mouth or tongue to go to ER , Patient would like a call back with the name of medication given.

## 2021-04-02 NOTE — Telephone Encounter (Signed)
Please call him to check that he is doing better

## 2021-04-04 ENCOUNTER — Telehealth: Payer: Self-pay | Admitting: *Deleted

## 2021-04-04 NOTE — Telephone Encounter (Signed)
Called the patient to f/u on how he was doing since injection,stated that it is better, took benadryl,resolved the issue. ?

## 2021-04-04 NOTE — Telephone Encounter (Signed)
Called patient and he said that he is doing better, took benadryl.

## 2021-07-25 ENCOUNTER — Encounter: Payer: Self-pay | Admitting: Internal Medicine

## 2022-05-20 ENCOUNTER — Encounter: Payer: Self-pay | Admitting: *Deleted

## 2022-07-07 ENCOUNTER — Encounter (HOSPITAL_COMMUNITY): Payer: Self-pay

## 2022-07-07 ENCOUNTER — Ambulatory Visit (HOSPITAL_COMMUNITY)
Admission: EM | Admit: 2022-07-07 | Discharge: 2022-07-07 | Disposition: A | Discharge status: Home / Self Care | Payer: 59 | Attending: Emergency Medicine | Admitting: Emergency Medicine

## 2022-07-07 DIAGNOSIS — R319 Hematuria, unspecified: Secondary | ICD-10-CM | POA: Insufficient documentation

## 2022-07-07 DIAGNOSIS — R300 Dysuria: Secondary | ICD-10-CM | POA: Insufficient documentation

## 2022-07-07 DIAGNOSIS — N50819 Testicular pain, unspecified: Secondary | ICD-10-CM | POA: Diagnosis not present

## 2022-07-07 NOTE — ED Triage Notes (Signed)
Pt presents to office with testicle pain for 2 weeks. Pt states he noticed blood in his urine.  Pt reports low grade temp last week.

## 2022-07-07 NOTE — ED Provider Notes (Signed)
[facility]    CSN: [id number] Arrival date & time: 07/07/22  1438    HISTORY   Chief Complaint  Patient presents with   Testicle Pain   Dysuria   HPI Timothy Newton is a pleasant, 62 y.o. male who presents to urgent care today. Pt presents to office with testicle pain for 2 weeks. Pt states he noticed blood in his urine. Pt reports low grade temp last week, VS are normal on arrival today.  Patient states he is unable to urinate during his visit today due to urinating immediately prior to his visit.  Patient was provided with a sterile urine specimen cup and advised to return with his urine sample if he is able.  Patient denies penile discharge, genital lesion, possible exposure to STD.   The history is provided by the patient.   Past Medical History:  Diagnosis Date   Anemia    Bleeding gastric ulcer    Blood transfusion    H. 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 Authorizing Provider  lisinopril (ZESTRIL) 20 MG tablet Take by mouth. 03/26/21  Yes [provider]  tadalafil (CIALIS) 20 MG tablet Take 1 tablet (20 mg total) by mouth daily as needed for erectile dysfunction. 04/16/19   Rodriguez-Southworth, Nettie Elm, PA-C    Family History Family History  Problem Relation Age of Onset   Colon cancer Neg Hx    Diabetes Neg Hx    Heart  disease Neg Hx    Social History Social History   Tobacco Use   Smoking status: Former    Packs/day: 1.00    Years: 5.00    Additional pack years: 0.00    Total pack years: 5.00    Types: Cigarettes    Quit date: 03/18/2001    Years since quitting: 21.3   Smokeless tobacco: Former  Building services engineer Use: Never used  Substance Use Topics   Alcohol use: No   Drug use: No   Allergies   Patient has no known allergies.  Review of Systems Review of Systems Pertinent findings revealed after performing a 14 point review of systems has been noted in the history of present illness.  Physical Exam Vital Signs BP 120/78 (BP Location: Left Arm)   Pulse 95   Temp 98.8 F (37.1 C) (Oral)   Resp 18   SpO2 98%   No data found.  Physical Exam Patient left prior to physical examination. Visual Acuity Right Eye Distance:   Left Eye Distance:   Bilateral Distance:    Right Eye Near:   Left Eye Near:    Bilateral Near:     UC Couse / Diagnostics / Procedures:     Radiology No results found.  Procedures Procedures (including critical care time) EKG  Pending results:  Labs Reviewed  URINE CULTURE  POCT  URINALYSIS DIP (MANUAL ENTRY)    Medications Ordered in UC: Medications - No data to display  UC Diagnoses / Final Clinical Impressions(s)   I have reviewed the triage vital signs and the nursing notes.  Pertinent labs & imaging results that were available during my care of the patient were reviewed by me and considered in my medical decision making (see chart for details).    Final diagnoses:  None   Patient states he has a urologist and if he is unable to return with his sample today he will follow-up with his urologist tomorrow.  Patient did not return with urine sample.  No billable services were performed during this visit.  Please see discharge instructions below for details of plan of care as provided to patient. ED Prescriptions   None    PDMP not  reviewed this encounter.  Disposition Upon Discharge:  Condition: stable for discharge home  Patient presented with concern for an acute illness with associated systemic symptoms and significant discomfort requiring urgent management. In my opinion, this is a condition that a prudent lay person (someone who possesses an average knowledge of health and medicine) may potentially expect to result in complications if not addressed urgently such as respiratory distress, impairment of bodily function or dysfunction of bodily organs.   As such, the patient has been evaluated and assessed, work-up was performed and treatment was provided in alignment with urgent care protocols and evidence based medicine.  Patient/parent/caregiver has been advised that the patient may require follow up for further testing and/or treatment if the symptoms continue in spite of treatment, as clinically indicated and appropriate.  Routine symptom specific, illness specific and/or disease specific instructions were discussed with the patient and/or caregiver at length.  Prevention strategies for avoiding STD exposure were also discussed.  The patient will follow up with their current PCP if and as advised. If the patient does not currently have a PCP we will assist them in obtaining one.   The patient may need specialty follow up if the symptoms continue, in spite of conservative treatment and management, for further workup, evaluation, consultation and treatment as clinically indicated and appropriate.  Patient/parent/caregiver verbalized understanding and agreement of plan as discussed.  All questions were addressed during visit.  Please see discharge instructions below for further details of plan.  Discharge Instructions:   Discharge Instructions      If you are able, please return with your sterile urine sample so that we can test you for a urinary tract infection.      This office note has been dictated using  Teaching laboratory technician.  Unfortunately, this method of dictation can sometimes lead to typographical or grammatical errors.  I apologize for your inconvenience in advance if this occurs.  Please do not hesitate to reach out to me if clarification is needed.       Theadora Rama Scales, [place] 07/07/22 1913

## 2022-07-07 NOTE — Discharge Instructions (Addendum)
If you are able, please return with your sterile urine sample so that we can test you for a urinary tract infection.

## 2022-07-08 ENCOUNTER — Ambulatory Visit (HOSPITAL_COMMUNITY)
Admission: EM | Admit: 2022-07-08 | Discharge: 2022-07-08 | Disposition: A | Discharge status: Home / Self Care | Payer: 59 | Attending: Internal Medicine | Admitting: Internal Medicine

## 2022-07-08 ENCOUNTER — Telehealth (HOSPITAL_COMMUNITY): Payer: Self-pay | Admitting: Internal Medicine

## 2022-07-08 ENCOUNTER — Encounter (HOSPITAL_COMMUNITY): Payer: Self-pay | Admitting: Emergency Medicine

## 2022-07-08 ENCOUNTER — Telehealth (HOSPITAL_COMMUNITY): Payer: Self-pay | Admitting: Emergency Medicine

## 2022-07-08 ENCOUNTER — Other Ambulatory Visit: Payer: Self-pay

## 2022-07-08 DIAGNOSIS — N50819 Testicular pain, unspecified: Secondary | ICD-10-CM | POA: Insufficient documentation

## 2022-07-08 DIAGNOSIS — R8290 Unspecified abnormal findings in urine: Secondary | ICD-10-CM

## 2022-07-08 DIAGNOSIS — R300 Dysuria: Secondary | ICD-10-CM | POA: Diagnosis not present

## 2022-07-08 LAB — CYTOLOGY, (ORAL, ANAL, URETHRAL) ANCILLARY ONLY
Chlamydia: NEGATIVE
Comment: NEGATIVE
Comment: NEGATIVE
Comment: NORMAL
Neisseria Gonorrhea: NEGATIVE
Trichomonas: NEGATIVE

## 2022-07-08 LAB — POCT URINALYSIS DIP (MANUAL ENTRY)
Bilirubin, UA: NEGATIVE
Glucose, UA: NEGATIVE mg/dL
Ketones, POC UA: NEGATIVE mg/dL
Nitrite, UA: POSITIVE — AB
Protein Ur, POC: 100 mg/dL — AB
Spec Grav, UA: >=1.03 — AB (ref 1.010–1.025)
Urobilinogen, UA: 1 E.U./dL
pH, UA: 5.5 (ref 5.0–8.0)

## 2022-07-08 MED ORDER — CEPHALEXIN 500 MG PO CAPS: 500.0000 mg | ORAL_CAPSULE | Freq: Two times a day (BID) | ORAL | 0 refills | Status: AC

## 2022-07-08 NOTE — ED Notes (Signed)
No answer when called from lobby 

## 2022-07-08 NOTE — ED Notes (Signed)
Pt was seen here yesterday and told to bring back an urine specimen since patient was unable to provide one yesterday while being seen

## 2022-07-08 NOTE — ED Notes (Signed)
Provided copy of medication and location it had been sent to

## 2022-07-08 NOTE — Telephone Encounter (Signed)
Urine sample was brought in today by the patient.  The patient could not produce a urine sample yesterday when he came to the urgent care and was instructed to bring the urine sample.  Sample has been received and tested.  Urine test confirms urinary tract infection.  Urine cultures have been sent.  Patient was prescribed Keflex 500 mg twice daily for 5 days.

## 2022-07-08 NOTE — ED Triage Notes (Signed)
Came yesterday to be seen.  Unable to provide urine.    Patient arrived this morning with another specimen.    Reportedly, patient needs to speak to provider and discuss results and game plan

## 2022-07-08 NOTE — Telephone Encounter (Signed)
Per Lillia Abed, APP patient was supposed to return last night with urine sample, or follow up with urology today.  No treatment at this time, patient will need to be reseen as no visit was charged last night.   Patient called and updated, he verbalized understanding

## 2022-07-09 LAB — URINE CULTURE: Culture: >=100000 — AB

## 2022-07-10 LAB — URINE CULTURE

## 2022-12-19 ENCOUNTER — Other Ambulatory Visit: Payer: Self-pay | Admitting: Urology

## 2022-12-20 NOTE — Progress Notes (Signed)
PCP -  Cardiologist -   PPM/ICD -  Device Orders -  Rep Notified -   Chest x-ray -  EKG -  Stress Test -  ECHO -  Cardiac Cath -   Sleep Study -  CPAP -   Fasting Blood Sugar -  Checks Blood Sugar _____ times a day  Blood Thinner Instructions: Aspirin Instructions:  ERAS Protcol - PRE-SURGERY Ensure or G2-   COVID TEST-  COVID vaccine -  Activity-- Anesthesia review:   Patient denies shortness of breath, fever, cough and chest pain at PAT appointment   All instructions explained to the patient, with a verbal understanding of the material. Patient agrees to go over the instructions while at home for a better understanding. Patient also instructed to self quarantine after being tested for COVID-19. The opportunity to ask questions was provided.   

## 2022-12-20 NOTE — Patient Instructions (Signed)
SURGICAL WAITING ROOM VISITATION  Patients having surgery or a procedure may have no more than 2 support people in the waiting area - these visitors may rotate.    Children under the age of 29 must have an adult with them who is not the patient.   If the patient needs to stay at the hospital during part of their recovery, the visitor guidelines for inpatient rooms apply. Pre-op nurse will coordinate an appropriate time for 1 support person to accompany patient in pre-op.  This support person may not rotate.    Please refer to the [facility] website for the visitor guidelines for Inpatients (after your surgery is over and you are in a regular room).       Your procedure is scheduled on: 12-27-22   Report to [facility] Main Entrance    Report to admitting at      11:35 AM   Call this number if you have problems the morning of surgery [phone]   Do not eat food :After Midnight.   After Midnight you may have the following liquids until __0750 ____ AM/  DAY OF SURGERY   then nothing by mouth  Water Non-Citrus Juices (without pulp, NO RED-Apple, White grape, White cranberry) Black Coffee (NO MILK/CREAM OR CREAMERS, sugar ok)  Clear Tea (NO MILK/CREAM OR CREAMERS, sugar ok) regular and decaf                             Plain Jell-O (NO RED)                                           Fruit ices (not with fruit pulp, NO RED)                                     Popsicles (NO RED)                                                               Sports drinks like Gatorade (NO RED)                            If you have questions, please contact your surgeon's office.   FOLLOW ANY ADDITIONAL PRE OP INSTRUCTIONS YOU RECEIVED FROM YOUR SURGEON'S OFFICE!!!     Oral Hygiene is also important to reduce your risk of infection.                                    Remember - BRUSH YOUR TEETH THE MORNING OF SURGERY WITH YOUR REGULAR TOOTHPASTE  DENTURES WILL BE REMOVED PRIOR TO  SURGERY PLEASE DO NOT APPLY "Poly grip" OR ADHESIVES!!!   Do NOT smoke after Midnight   Stop all vitamins and herbal supplements 7 days before surgery.   Take these medicines the morning of surgery with A SIP OF WATER: NONE  DO NOT TAKE ANY ORAL DIABETIC MEDICATIONS DAY OF YOUR SURGERY  Bring CPAP mask and tubing day of surgery.                              You may not have any metal on your body including hair pins, jewelry, and body piercing             Do not wear  lotions, powders, perfumes/cologne, or deodorant               Men may shave face and neck.   Do not bring valuables to the hospital. [facility] IS NOT             RESPONSIBLE   FOR VALUABLES.   Contacts, glasses, dentures or bridgework may not be worn into surgery.     DO NOT BRING YOUR HOME MEDICATIONS TO THE HOSPITAL. PHARMACY WILL DISPENSE MEDICATIONS LISTED ON YOUR MEDICATION LIST TO YOU DURING YOUR ADMISSION IN THE HOSPITAL!    Patients discharged on the day of surgery will not be allowed to drive home.  Someone NEEDS to stay with you for the first 24 hours after anesthesia.   Special Instructions: Bring a copy of your healthcare power of attorney and living will documents the day of surgery if you haven't scanned them before.              Please read over the following fact sheets you were given: IF YOU HAVE QUESTIONS ABOUT YOUR PRE-OP INSTRUCTIONS PLEASE CALL [phone]   . If you test positive for Covid or have been in contact with anyone that has tested positive in the last 10 days please notify you surgeon.    [place] - Preparing for Surgery Before surgery, you can play an important role.  Because skin is not sterile, your skin needs to be as free of germs as possible.  You can reduce the number of germs on your skin by washing with CHG (chlorahexidine gluconate) soap before surgery.  CHG is an antiseptic cleaner which kills germs and bonds with the skin to continue killing germs even after  washing. Please DO NOT use if you have an allergy to CHG or antibacterial soaps.  If your skin becomes reddened/irritated stop using the CHG and inform your nurse when you arrive at Short Stay. Do not shave (including legs and underarms) for at least 48 hours prior to the first CHG shower.  You may shave your face/neck. Please follow these instructions carefully:  1.  Shower with CHG Soap the night before surgery and the  morning of Surgery.  2.  If you choose to wash your hair, wash your hair first as usual with your  normal  shampoo.  3.  After you shampoo, rinse your hair and body thoroughly to remove the  shampoo.                           4.  Use CHG as you would any other liquid soap.  You can apply chg directly  to the skin and wash                       Gently with a scrungie or clean washcloth.  5.  Apply the CHG Soap to your body ONLY FROM THE NECK DOWN.   Do not use on face/ open  Wound or open sores. Avoid contact with eyes, ears mouth and genitals (private parts).                       Wash face,  Genitals (private parts) with your normal soap.             6.  Wash thoroughly, paying special attention to the area where your surgery  will be performed.  7.  Thoroughly rinse your body with warm water from the neck down.  8.  DO NOT shower/wash with your normal soap after using and rinsing off  the CHG Soap.                9.  Pat yourself dry with a clean towel.            10.  Wear clean pajamas.            11.  Place clean sheets on your bed the night of your first shower and do not  sleep with pets. Day of Surgery : Do not apply any lotions/deodorants the morning of surgery.  Please wear clean clothes to the hospital/surgery center.  FAILURE TO FOLLOW THESE INSTRUCTIONS MAY RESULT IN THE CANCELLATION OF YOUR SURGERY PATIENT SIGNATURE_________________________________  NURSE  SIGNATURE__________________________________  ________________________________________________________________________

## 2022-12-23 NOTE — Progress Notes (Signed)
Sent message, via epic in basket, requesting orders in epic from surgeon.  

## 2022-12-25 ENCOUNTER — Encounter (HOSPITAL_COMMUNITY)
Admission: RE | Admit: 2022-12-25 | Discharge: 2022-12-25 | Disposition: A | Discharge status: Home / Self Care | Payer: 59 | Source: Ambulatory Visit | Attending: Urology | Admitting: Urology

## 2022-12-25 ENCOUNTER — Other Ambulatory Visit: Payer: Self-pay

## 2022-12-25 ENCOUNTER — Encounter (HOSPITAL_COMMUNITY): Payer: Self-pay

## 2022-12-25 VITALS — HR 71 | Temp 98.6°F | Resp 16 | Ht 74.0 in | Wt 250.0 lb

## 2022-12-25 DIAGNOSIS — I10 Essential (primary) hypertension: Secondary | ICD-10-CM | POA: Diagnosis not present

## 2022-12-25 DIAGNOSIS — Z01818 Encounter for other preprocedural examination: Secondary | ICD-10-CM | POA: Diagnosis present

## 2022-12-25 LAB — CBC
HCT: 39.6 % (ref 39.0–52.0)
Hemoglobin: 12.3 g/dL — ABNORMAL LOW (ref 13.0–17.0)
MCH: 29.6 pg (ref 26.0–34.0)
MCHC: 31.1 g/dL (ref 30.0–36.0)
MCV: 95.2 fL (ref 80.0–100.0)
Platelets: 257 10*3/uL (ref 150–400)
RBC: 4.16 MIL/uL — ABNORMAL LOW (ref 4.22–5.81)
RDW: 12.2 % (ref 11.5–15.5)
WBC: 7.1 10*3/uL (ref 4.0–10.5)
nRBC: 0 % (ref 0.0–0.2)

## 2022-12-25 LAB — BASIC METABOLIC PANEL
Anion gap: 6 (ref 5–15)
BUN: 15 mg/dL (ref 8–23)
CO2: 25 mmol/L (ref 22–32)
Calcium: 9.8 mg/dL (ref 8.9–10.3)
Chloride: 106 mmol/L (ref 98–111)
Creatinine, Ser: 1.16 mg/dL (ref 0.61–1.24)
GFR, Estimated: >60 mL/min (ref >60)
Glucose, Bld: 108 mg/dL — ABNORMAL HIGH (ref 70–99)
Potassium: 4.2 mmol/L (ref 3.5–5.1)
Sodium: 137 mmol/L (ref 135–145)

## 2022-12-27 ENCOUNTER — Ambulatory Visit (HOSPITAL_BASED_OUTPATIENT_CLINIC_OR_DEPARTMENT_OTHER): Payer: 59 | Admitting: Anesthesiology

## 2022-12-27 ENCOUNTER — Ambulatory Visit (HOSPITAL_COMMUNITY)
Admission: RE | Admit: 2022-12-27 | Discharge: 2022-12-27 | Disposition: A | Discharge status: Home / Self Care | Payer: 59 | Attending: Urology | Admitting: Urology

## 2022-12-27 ENCOUNTER — Encounter: Payer: Self-pay | Admitting: Surgery

## 2022-12-27 ENCOUNTER — Telehealth: Payer: Self-pay

## 2022-12-27 ENCOUNTER — Other Ambulatory Visit: Payer: Self-pay

## 2022-12-27 ENCOUNTER — Ambulatory Visit (HOSPITAL_COMMUNITY): Payer: 59 | Admitting: Anesthesiology

## 2022-12-27 ENCOUNTER — Encounter (HOSPITAL_COMMUNITY): Payer: Self-pay | Admitting: Urology

## 2022-12-27 ENCOUNTER — Encounter (HOSPITAL_COMMUNITY)
Admission: RE | Disposition: A | Discharge status: Home / Self Care | Payer: Self-pay | Source: Home / Self Care | Attending: Urology

## 2022-12-27 DIAGNOSIS — N433 Hydrocele, unspecified: Secondary | ICD-10-CM | POA: Diagnosis present

## 2022-12-27 DIAGNOSIS — I10 Essential (primary) hypertension: Secondary | ICD-10-CM | POA: Diagnosis not present

## 2022-12-27 DIAGNOSIS — Z87891 Personal history of nicotine dependence: Secondary | ICD-10-CM | POA: Insufficient documentation

## 2022-12-27 DIAGNOSIS — R1909 Other intra-abdominal and pelvic swelling, mass and lump: Secondary | ICD-10-CM | POA: Insufficient documentation

## 2022-12-27 HISTORY — PX: HYDROCELE EXCISION: SHX482

## 2022-12-27 HISTORY — PX: ORCHIOPEXY: SHX479

## 2022-12-27 SURGERY — HYDROCELECTOMY
Anesthesia: General | Site: Scrotum | Laterality: Bilateral

## 2022-12-27 MED ORDER — PROPOFOL 10 MG/ML IV BOLUS
INTRAVENOUS | Status: AC
  Filled 2022-12-27: qty 20

## 2022-12-27 MED ORDER — EPHEDRINE SULFATE-NACL 50-0.9 MG/10ML-% IV SOSY
PREFILLED_SYRINGE | INTRAVENOUS | Status: DC | PRN
  Administered 2022-12-27: 5 mg via INTRAVENOUS
  Administered 2022-12-27: 10 mg via INTRAVENOUS
  Administered 2022-12-27: 5 mg via INTRAVENOUS

## 2022-12-27 MED ORDER — CHLORHEXIDINE GLUCONATE 0.12 % MT SOLN
15.0000 mL | Freq: Once | OROMUCOSAL | Status: AC
Start: 2022-12-27 — End: 2022-12-27
  Administered 2022-12-27: 15 mL via OROMUCOSAL

## 2022-12-27 MED ORDER — LIDOCAINE HCL (PF) 2 % IJ SOLN
INTRAMUSCULAR | Status: AC
  Filled 2022-12-27: qty 5

## 2022-12-27 MED ORDER — ONDANSETRON HCL 4 MG/2ML IJ SOLN
4.0000 mg | Freq: Once | INTRAMUSCULAR | Status: DC | PRN
Start: 2022-12-27 — End: 2022-12-27

## 2022-12-27 MED ORDER — PHENYLEPHRINE 80 MCG/ML (10ML) SYRINGE FOR IV PUSH (FOR BLOOD PRESSURE SUPPORT)
PREFILLED_SYRINGE | INTRAVENOUS | Status: AC
  Filled 2022-12-27: qty 10

## 2022-12-27 MED ORDER — SENNOSIDES-DOCUSATE SODIUM 8.6-50 MG PO TABS: 1.0000 | ORAL_TABLET | Freq: Two times a day (BID) | ORAL | 0 refills | Status: DC

## 2022-12-27 MED ORDER — KETOROLAC TROMETHAMINE 30 MG/ML IJ SOLN: 30.0000 mg | Freq: Once | INTRAMUSCULAR | Status: DC | PRN

## 2022-12-27 MED ORDER — HYDROMORPHONE HCL 2 MG/ML IJ SOLN
INTRAMUSCULAR | Status: AC
  Filled 2022-12-27: qty 1

## 2022-12-27 MED ORDER — HYDROMORPHONE HCL 1 MG/ML IJ SOLN
INTRAMUSCULAR | Status: DC | PRN
  Administered 2022-12-27: .4 mg via INTRAVENOUS
  Administered 2022-12-27: .2 mg via INTRAVENOUS
  Administered 2022-12-27: .4 mg via INTRAVENOUS

## 2022-12-27 MED ORDER — EPHEDRINE 5 MG/ML INJ
INTRAVENOUS | Status: AC
  Filled 2022-12-27: qty 5

## 2022-12-27 MED ORDER — CLINDAMYCIN PHOSPHATE 600 MG/50ML IV SOLN
600.0000 mg | INTRAVENOUS | Status: AC
  Administered 2022-12-27: 600 mg via INTRAVENOUS
  Filled 2022-12-27: qty 50

## 2022-12-27 MED ORDER — ACETAMINOPHEN 10 MG/ML IV SOLN
INTRAVENOUS | Status: DC | PRN
  Administered 2022-12-27: 1000 mg via INTRAVENOUS

## 2022-12-27 MED ORDER — SODIUM CHLORIDE (PF) 0.9 % IJ SOLN
INTRAMUSCULAR | Status: AC
  Filled 2022-12-27: qty 10

## 2022-12-27 MED ORDER — OXYCODONE HCL 5 MG PO TABS
5.0000 mg | ORAL_TABLET | Freq: Four times a day (QID) | ORAL | 0 refills | Status: DC | PRN
Start: 2022-12-27 — End: 2023-12-16

## 2022-12-27 MED ORDER — BUPIVACAINE HCL (PF) 0.25 % IJ SOLN
INTRAMUSCULAR | Status: AC
  Filled 2022-12-27: qty 30

## 2022-12-27 MED ORDER — DEXAMETHASONE SODIUM PHOSPHATE 10 MG/ML IJ SOLN
INTRAMUSCULAR | Status: AC
  Filled 2022-12-27: qty 1

## 2022-12-27 MED ORDER — MIDAZOLAM HCL 2 MG/2ML IJ SOLN
INTRAMUSCULAR | Status: AC
  Filled 2022-12-27: qty 2

## 2022-12-27 MED ORDER — ONDANSETRON HCL 4 MG/2ML IJ SOLN
INTRAMUSCULAR | Status: DC | PRN
  Administered 2022-12-27: 4 mg via INTRAVENOUS

## 2022-12-27 MED ORDER — FENTANYL CITRATE PF 50 MCG/ML IJ SOSY: 25.0000 ug | PREFILLED_SYRINGE | INTRAMUSCULAR | Status: DC | PRN

## 2022-12-27 MED ORDER — BUPIVACAINE HCL (PF) 0.25 % IJ SOLN
INTRAMUSCULAR | Status: DC | PRN
  Administered 2022-12-27: 30 mL

## 2022-12-27 MED ORDER — LIDOCAINE 2% (20 MG/ML) 5 ML SYRINGE
INTRAMUSCULAR | Status: DC | PRN
  Administered 2022-12-27: 100 mg via INTRAVENOUS

## 2022-12-27 MED ORDER — MIDAZOLAM HCL 2 MG/2ML IJ SOLN
INTRAMUSCULAR | Status: DC | PRN
  Administered 2022-12-27: 2 mg via INTRAVENOUS

## 2022-12-27 MED ORDER — 0.9 % SODIUM CHLORIDE (POUR BTL) OPTIME
TOPICAL | Status: DC | PRN
  Administered 2022-12-27: 1000 mL

## 2022-12-27 MED ORDER — OXYCODONE HCL 5 MG PO TABS: 5.0000 mg | ORAL_TABLET | Freq: Once | ORAL | Status: DC | PRN

## 2022-12-27 MED ORDER — ACETAMINOPHEN 10 MG/ML IV SOLN
INTRAVENOUS | Status: AC
  Filled 2022-12-27: qty 100

## 2022-12-27 MED ORDER — DEXAMETHASONE SODIUM PHOSPHATE 10 MG/ML IJ SOLN
INTRAMUSCULAR | Status: DC | PRN
  Administered 2022-12-27: 10 mg via INTRAVENOUS

## 2022-12-27 MED ORDER — OXYCODONE HCL 5 MG/5ML PO SOLN: 5.0000 mg | Freq: Once | ORAL | Status: DC | PRN

## 2022-12-27 MED ORDER — PROPOFOL 10 MG/ML IV BOLUS
INTRAVENOUS | Status: DC | PRN
  Administered 2022-12-27: 200 mg via INTRAVENOUS

## 2022-12-27 MED ORDER — PHENYLEPHRINE 80 MCG/ML (10ML) SYRINGE FOR IV PUSH (FOR BLOOD PRESSURE SUPPORT)
PREFILLED_SYRINGE | INTRAVENOUS | Status: DC | PRN
  Administered 2022-12-27: 160 ug via INTRAVENOUS

## 2022-12-27 MED ORDER — ONDANSETRON HCL 4 MG/2ML IJ SOLN
INTRAMUSCULAR | Status: AC
  Filled 2022-12-27: qty 2

## 2022-12-27 MED ORDER — FENTANYL CITRATE (PF) 250 MCG/5ML IJ SOLN
INTRAMUSCULAR | Status: AC
  Filled 2022-12-27: qty 5

## 2022-12-27 MED ORDER — LACTATED RINGERS IV SOLN: INTRAVENOUS | Status: DC

## 2022-12-27 MED ORDER — ORAL CARE MOUTH RINSE: 15.0000 mL | Freq: Once | OROMUCOSAL | Status: AC

## 2022-12-27 MED ORDER — FENTANYL CITRATE (PF) 100 MCG/2ML IJ SOLN
INTRAMUSCULAR | Status: DC | PRN
  Administered 2022-12-27 (×5): 50 ug via INTRAVENOUS

## 2022-12-27 SURGICAL SUPPLY — 47 items
BAG COUNTER SPONGE SURGICOUNT (BAG) ×4 IMPLANT
BENZOIN TINCTURE PRP APPL 2/3 (GAUZE/BANDAGES/DRESSINGS) ×2 IMPLANT
BLADE CLIPPER SENSICLIP SURGIC (BLADE) ×2 IMPLANT
BLADE HEX COATED 2.75 (ELECTRODE) ×2 IMPLANT
BLADE SURG 15 STRL LF DISP TIS (BLADE) ×2 IMPLANT
BNDG GAUZE DERMACEA FLUFF 4 (GAUZE/BANDAGES/DRESSINGS) ×2 IMPLANT
BRIEF MESH DISP LRG (UNDERPADS AND DIAPERS) ×2 IMPLANT
COVER BACK TABLE 60X90IN (DRAPES) ×2 IMPLANT
COVER MAYO STAND STRL (DRAPES) ×2 IMPLANT
DERMABOND ADVANCED .7 DNX12 (GAUZE/BANDAGES/DRESSINGS) ×2 IMPLANT
DISSECTOR ROUND CHERRY 3/8 STR (MISCELLANEOUS) IMPLANT
DRAIN PENROSE 0.25X18 (DRAIN) ×2 IMPLANT
DRAIN PENROSE 0.5X18 (DRAIN) ×2 IMPLANT
DRAPE LAPAROTOMY T 98X78 PEDS (DRAPES) ×2 IMPLANT
DRSG TEGADERM 4X4.75 (GAUZE/BANDAGES/DRESSINGS) IMPLANT
ELECT REM PT RETURN 15FT ADLT (MISCELLANEOUS) ×2 IMPLANT
GAUZE PAD ABD 8X10 STRL (GAUZE/BANDAGES/DRESSINGS) IMPLANT
GAUZE SPONGE 4X4 12PLY STRL (GAUZE/BANDAGES/DRESSINGS) ×2 IMPLANT
GLOVE BIO SURGEON STRL SZ7.5 (GLOVE) ×2 IMPLANT
GLOVE SURG LX STRL 7.5 STRW (GLOVE) ×2 IMPLANT
GOWN SRG XL LVL 4 BRTHBL STRL (GOWNS) ×2 IMPLANT
KIT BASIN OR (CUSTOM PROCEDURE TRAY) ×2 IMPLANT
KIT TURNOVER KIT A (KITS) IMPLANT
NDL HYPO 22X1.5 SAFETY MO (MISCELLANEOUS) IMPLANT
NDL HYPO 25X1 1.5 SAFETY (NEEDLE) ×2 IMPLANT
NEEDLE HYPO 22X1.5 SAFETY MO (MISCELLANEOUS) IMPLANT
NEEDLE HYPO 25X1 1.5 SAFETY (NEEDLE) ×1 IMPLANT
NS IRRIG 1000ML POUR BTL (IV SOLUTION) IMPLANT
PACK BASIC VI WITH GOWN DISP (CUSTOM PROCEDURE TRAY) ×2 IMPLANT
PENCIL SMOKE EVACUATOR (MISCELLANEOUS) IMPLANT
SPONGE T-LAP 4X18 ~~LOC~~+RFID (SPONGE) ×4 IMPLANT
SUPPORTER AHLETIC TETRA LG (SOFTGOODS) ×2 IMPLANT
SUT ETHILON 3 0 PS 1 (SUTURE) IMPLANT
SUT ETHILON 4 0 PS 2 18 (SUTURE) ×2 IMPLANT
SUT MNCRL AB 4-0 PS2 18 (SUTURE) ×2 IMPLANT
SUT SILK 0 30XBRD TIE 6 (SUTURE) ×2 IMPLANT
SUT VIC AB 2-0 SH 27XBRD (SUTURE) IMPLANT
SUT VIC AB 3-0 SH 27X BRD (SUTURE) IMPLANT
SUT VIC AB 3-0 SH 27XBRD (SUTURE) ×2 IMPLANT
SUT VIC AB 4-0 SH 27XANBCTRL (SUTURE) ×2 IMPLANT
SYR 20ML LL LF (SYRINGE) ×2 IMPLANT
SYR CONTROL 10ML LL (SYRINGE) ×2 IMPLANT
TOWEL OR 17X26 10 PK STRL BLUE (TOWEL DISPOSABLE) ×4 IMPLANT
TUBING CONNECTING 10 (TUBING) ×2 IMPLANT
WATER STERILE IRR 1000ML POUR (IV SOLUTION) IMPLANT
WATER STERILE IRR 500ML POUR (IV SOLUTION) IMPLANT
YANKAUER SUCT BULB TIP NO VENT (SUCTIONS) ×2 IMPLANT

## 2022-12-27 NOTE — Telephone Encounter (Signed)
-----   Message from Carie Caddy Pyrtle sent at 12/27/2022 11:20 AM EST ----- Regarding: RE: New ileal mass Lennex Pietila Copy to chart: Agree with recommendation for colonoscopy given abnormal CT of the abdomen pelvis suggesting soft tissue mass in the distal ileum It should be noted that this appears to be about 8 cm proximal to the IC valve and so we will only know if this is reachable by colonoscopy with ileal intubation once we attempt CT scan findings below This can be scheduled in the [facility] with me or next available provider if the wait for my next colonoscopy visit is lengthy JMP  IMPRESSION: 1. 4.3 cm soft tissue mass associated with the distal ileum located about 8 cm proximal to the ileocecal valve, favoring recurrent MALT lymphoma. Adjacent ileocolic lymph nodes measure up to about 1.1 cm in diameter. 2. Large left scrotal hydrocele, single internal septation. Volume approximally 1240 cc. Scrotal thickening on the left. 3. We partially include a region of airspace opacity in the right middle lobe, suspicious for pneumonia. This opacity extends above the lung bases and is not fully characterized. Small right pleural effusion observed. 4. Mildly prominent lymph node in the right pericardial adipose tissue, not previously visible. 5. Lower lumbar spondylosis and degenerative disc disease causing impingement at L3-4, L4-5, and L5-S1. 6. Mild prostatomegaly. 7. Two supraumbilical ventral hernias contain adipose tissue.   Electronically Signed By: Gaylyn Rong M.D. On: 12/26/2022 17:55 ----- Message ----- From: Karie Soda, MD Sent: 12/27/2022  10:54 AM EST To: Ethlyn Gallery; Beverley Fiedler, MD Subject: New ileal mass                                 Alisha:  I was curbsided by Dr. Dwana Curd with [facility] who is sending me this patient.  He was planning to remove some large scrotal hydroceles & part of the workup was a CT scan of the abdomen.    Patient has a new mass at  the ileocecal junction incidentally found on CAT scan.  Radiology wonders if this is a recurrent MALT lymphoma but that was in his stomach in 2013.  Patient does not have any symptoms of obstruction or pain right now.  I think the patient could benefit from surgery to remove this (most likely robotic ileocolectomy) for this mass.  I think the patient needs a colonoscopy preop since his last one was in 2013 and Dr. Lauro Franklin notes talk about a 10-year follow-up anyway, let alone this new ileocecal mass.  I do not see one done recently.  See if Dr. Rhea Belton agrees.

## 2022-12-27 NOTE — Anesthesia Procedure Notes (Signed)
Procedure Name: LMA Insertion Date/Time: 12/27/2022 2:39 PM  Performed by: Elyn Peers, CRNAPre-anesthesia Checklist: Patient identified, Emergency Drugs available, Suction available, Patient being monitored and Timeout performed Patient Re-evaluated:Patient Re-evaluated prior to induction Oxygen Delivery Method: Circle system utilized Preoxygenation: Pre-oxygenation with 100% oxygen Induction Type: IV induction Ventilation: Mask ventilation without difficulty LMA: LMA inserted LMA Size: 5.0 Number of attempts: 1 Placement Confirmation: positive ETCO2 and breath sounds checked- equal and bilateral Tube secured with: Tape Dental Injury: Teeth and Oropharynx as per pre-operative assessment

## 2022-12-27 NOTE — Op Note (Unsigned)
NAME: Timothy Newton, Timothy Newton MEDICAL RECORD NO: [id number] ACCOUNT NO: [id number] DATE OF BIRTH: 10-Sep-1960 FACILITY: [facility] LOCATION: [facility] PHYSICIAN: Sebastian Ache, MD  Operative Report   DATE OF PROCEDURE: 12/27/2022  PREOPERATIVE DIAGNOSES: 1.  Massive bilateral hydroceles.  POSTOPERATIVE DIAGNOSES: 1.  Massive bilateral hydroceles.  PROCEDURES PERFORMED: 1.  Bilateral hydrocelectomy. 2.  Bilateral orchiopexy.  ESTIMATED BLOOD LOSS: 50 mL.  COMPLICATIONS: None.  SPECIMENS: Large hydrocele sac with excess scrotal skin to pathology.  FINDINGS: 1.  Approximately 1300 mL volume bilateral hydroceles. 2.  Likely left testicular infection, some left testis atrophy, but still blood flow and viability. 3.  Excessive testicular mobility following hydrocelectomy, necessitating bilateral orchiopexy.  DRAIN:  Penrose drain to dependent scrotal wound drainage.  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  He has had a multi-year course of progressive scrotal swelling.  It has become very bothersome and made it difficult to climb in and out of the truck.  He was  evaluated and found to have massive bilateral hydroceles.  His exam was relatively insensitive for ruling out hernias.  Therefore, a CT scan was performed, which did rule out any concomitant inguinal hernias.  It did reveal an incidental small bowel  mass, which I discussed with the patient and already referred him to general surgery for.  He wished to proceed with definitive hydrocelectomy.  Inform consent was obtained and placed in the record.  DESCRIPTION OF PROCEDURE: The patient being Matsuo Rezendes was verified and the procedure being bilateral hydrocelectomy and bilateral orchiopexy was confirmed.  Procedure timeout was performed.  Intravenous antibiotics administered, general anesthesia was induced.  The  patient was placed into a supine position.  The  entire scrotum and infrapubic area was clipper shaved and then prepped and draped using iodine.  A very large elliptical area for resection was marked out with marking pen in a semicircle orientation, the  ends being in the right and left groin respectively and this large area of skin was developed using scalpel and then Bovie cautery down to deliver the true hydroceles, which were then delivered into the operative field.  The right testis and hydrocele  was adherent to the left, but easily separated away as lesions between these were relatively loose.  The hydrocele was actually smaller than anticipated, only containing approximately 30 mL of fluid.  The left hydrocele was massive, multiloculated, and  somewhat dysplastic.  There was some locules containing simple straw-colored fluid, others containing more chocolate-colored, but simple fluid suggesting prior hemorrhage into this.  Total volume of fluid was approximately 1300 mL.  The left testis and  its cord was very carefully separated away from the dysplastic hydrocele sacs and upon gaining mobility to the true left testicle, it was very atrophic and very soft to palpation and actually questionably even viable.  A handheld Doppler ultrasound was  performed and I did detect arterial pulsations within the testis itself, which corroborated enough viability in my opinion to preserve it.  The very large hydrocele sac and excessive scrotal skin specimen was handed off en bloc.  Additional hemostasis  was achieved using very careful coagulation current at the scrotal bed.  Several small veins were oversewn using figure-of-eight Vicryl.  As expected, there was significant testicular mobility bilaterally, clearly necessitating orchiopexy as he would be  otherwise at a high risk of torsion.  Each testis was lying to natural orientation of the lateral sulcus lateral and pexed on the  lateral mesorchium testis to the inner lateral scrotal leaf.  Using interrupted 4-0  Prolene, great care was taken to avoid perforation of the  scrotal skin.  This resulted in excellent bilateral pexing of the testes.  The arteries were reapproximated using interrupted 2-0 Vicryl circumferentially in approximately 1-inch increments.  A Penrose drain was brought through the small counter  incision on the left deep in the scrotum to traverse both scrotal compartments.  This was anchored in place using simple nylon and the remaining scrotal skin was reapproximated using two separate running suture lines of 3-0 Vicryl.  This resulted in  excellent cosmesis.  Procedure was terminated.  The patient tolerated the procedure well and no immediate periprocedural complications.  The patient was taken to postanesthesia care unit in stable condition.  Plans for discharge home.   MUK D: 12/27/2022 4:19:53 pm T: 12/27/2022 9:13:00 pm  JOB: [id number]/ [id number]

## 2022-12-27 NOTE — Op Note (Deleted)
NAME: Timothy Newton, Timothy Newton MEDICAL RECORD NO: [id number] ACCOUNT NO: [id number] DATE OF BIRTH: 1960/04/24 FACILITY: [facility] LOCATION: [facility] PHYSICIAN: Sebastian Ache, MD  Operative Report   DATE OF PROCEDURE: 12/25/2022  PREOPERATIVE DIAGNOSES: 1.  Massive bilateral hydroceles.  POSTOPERATIVE DIAGNOSES: 1.  Massive bilateral hydroceles.  PROCEDURES PERFORMED: 1.  Bilateral hydrocelectomy. 2.  Bilateral orchiopexy.  ESTIMATED BLOOD LOSS: 50 mL.  COMPLICATIONS: None.  SPECIMENS: Large hydrocele sac with excess scrotal skin to pathology.  FINDINGS: 1.  Approximately 1300 mL volume bilateral hydroceles. 2.  Likely left testicular infection, some left testis atrophy, but still blood flow and viability. 3.  Excessive testicular mobility following hydrocelectomy, necessitating bilateral orchiopexy.  DRAIN:  Penrose drain to dependent scrotal wound drainage.  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  He has had a multi-year course of progressive scrotal swelling.  It has become very bothersome and made it difficult to climb in and out of the truck.  He was  evaluated and found to have massive bilateral hydroceles.  His exam was relatively insensitive for ruling out hernias.  Therefore, a CT scan was performed, which did rule out any concomitant inguinal hernias.  It did reveal an incidental small bowel  mass, which I discussed with the patient and already referred him to general surgery for.  He wished to proceed with definitive hydrocelectomy.  Inform consent was obtained and placed in the record.  DESCRIPTION OF PROCEDURE: The patient being Timothy Newton was verified and the procedure being bilateral hydrocelectomy and bilateral orchiopexy was confirmed.  Procedure timeout was performed.  Intravenous antibiotics administered, general anesthesia was induced.  The  patient was placed into a supine position.  The  entire scrotum and infrapubic area was clipper shaved and then prepped and draped using iodine.  A very large elliptical area for resection was marked out with marking pen in a semicircle orientation, the  ends being in the right and left groin respectively and this large area of skin was developed using scalpel and then Bovie cautery down to deliver the true hydroceles, which were then delivered into the operative field.  The right testis and hydrocele  was adherent to the left, but easily separated away as lesions between these were relatively loose.  The hydrocele was actually smaller than anticipated, only containing approximately 30 mL of fluid.  The left hydrocele was massive, multiloculated, and  somewhat dysplastic.  There was some locules containing simple straw-colored fluid, others containing more chocolate-colored, but simple fluid suggesting prior hemorrhage into this.  Total volume of fluid was approximately 1300 mL.  The left testis and  its cord was very carefully separated away from the dysplastic hydrocele sacs and upon gaining mobility to the true left testicle, it was very atrophic and very soft to palpation and actually questionably even viable.  A handheld Doppler ultrasound was  performed and I did detect arterial pulsations within the testis itself, which corroborated enough viability in my opinion to preserve it.  The very large hydrocele sac and excessive scrotal skin specimen was handed off en bloc.  Additional hemostasis  was achieved using very careful coagulation current at the scrotal bed.  Several small veins were oversewn using figure-of-eight Vicryl.  As expected, there was significant testicular mobility bilaterally, clearly necessitating orchiopexy as he would be  otherwise at a high risk of torsion.  Each testis was lying to natural orientation of the lateral sulcus lateral and pexed on the  lateral mesorchium testis to the inner lateral scrotal leaf.  Using interrupted 4-0  Prolene.  This resulted in excellent bilateral pexing of the testes.  The arteries were reapproximated using interrupted 2-0 Vicryl circumferentially in approximately 1-inch increments.  A Penrose drain was brought through the small counter  incision on the left deep in the scrotum to traverse both scrotal compartments.  This was anchored in place using simple nylon and the remaining scrotal skin was reapproximated using two separate running suture lines of 3-0 Vicryl.  This resulted in  excellent cosmesis.  Procedure was terminated.  The patient tolerated the procedure well and no immediate periprocedural complications.  The patient was taken to postanesthesia care unit in stable condition.  Plans for discharge home.   MUK D: 12/27/2022 4:19:53 pm T: 12/27/2022 9:13:00 pm  JOB: [id number]/ [id number]

## 2022-12-27 NOTE — Brief Op Note (Signed)
12/27/2022  4:11 PM  PATIENT:  Timothy Newton  62 y.o. male  PRE-OPERATIVE DIAGNOSIS:  MASSIVE BILATERAL HYDROCELE  POST-OPERATIVE DIAGNOSIS:  MASSIVE BILATERAL HYDROCELE  PROCEDURE:  Procedure(s) with comments: BILATERAL HYDROCELECTOMY ADULT (Bilateral) BILATERAL ORCHIOPEXY ADULT (Bilateral) - 120 MINS FOR CASE  SURGEON:  Surgeons and Role:    * Melora Menon, Delbert Phenix., MD - Primary  PHYSICIAN ASSISTANT:   ASSISTANTS: Zettie Pho MD   ANESTHESIA:   local and general  EBL:  150 mL   BLOOD ADMINISTERED:none  DRAINS: Penrose drain in the dependant scrotum    LOCAL MEDICATIONS USED:  MARCAINE     SPECIMEN:  Source of Specimen:  hydrocele sacs with reduncant scrotal skin  DISPOSITION OF SPECIMEN:  PATHOLOGY  COUNTS:  YES  TOURNIQUET:  * No tourniquets in log *  DICTATION: .Other Dictation: Dictation Number [id number]  PLAN OF CARE: Discharge to home after PACU  PATIENT DISPOSITION:  PACU - hemodynamically stable.   Delay start of Pharmacological VTE agent (>24hrs) due to surgical blood loss or risk of bleeding: yes

## 2022-12-27 NOTE — Telephone Encounter (Signed)
Left message for pt to call back and scheduled colon in the [facility].

## 2022-12-27 NOTE — Anesthesia Postprocedure Evaluation (Signed)
Anesthesia Post Note  Patient: Timothy Newton  Procedure(s) Performed: BILATERAL HYDROCELECTOMY ADULT (Bilateral: Scrotum) BILATERAL ORCHIOPEXY ADULT (Bilateral: Scrotum)     Patient location during evaluation: PACU Anesthesia Type: General Level of consciousness: awake and alert Pain management: pain level controlled Vital Signs Assessment: post-procedure vital signs reviewed and stable Respiratory status: spontaneous breathing, nonlabored ventilation, respiratory function stable and patient connected to nasal cannula oxygen Cardiovascular status: blood pressure returned to baseline and stable Postop Assessment: no apparent nausea or vomiting Anesthetic complications: no  No notable events documented.  Last Vitals:  Vitals:   12/27/22 1625 12/27/22 1630  BP: 132/85 (!) 122/91  Pulse: 70 67  Resp: 16 14  Temp: 37.1 C   SpO2: 100% 100%    Last Pain:  Vitals:   12/27/22 1231  TempSrc:   PainSc: 0-No pain                 Carrisa Keller S

## 2022-12-27 NOTE — Anesthesia Preprocedure Evaluation (Signed)
Anesthesia Evaluation  Patient identified by MRN, date of birth, ID band Patient awake    Reviewed: Allergy & Precautions, H&P , NPO status , Patient's Chart, lab work & pertinent test results  Airway Mallampati: II  TM Distance: >3 FB Neck ROM: Full    Dental no notable dental hx.    Pulmonary neg pulmonary ROS, former smoker   Pulmonary exam normal breath sounds clear to auscultation       Cardiovascular hypertension, Normal cardiovascular exam Rhythm:Regular Rate:Normal     Neuro/Psych negative neurological ROS  negative psych ROS   GI/Hepatic negative GI ROS, Neg liver ROS,,,  Endo/Other  negative endocrine ROS    Renal/GU negative Renal ROS  negative genitourinary   Musculoskeletal negative musculoskeletal ROS (+)    Abdominal   Peds negative pediatric ROS (+)  Hematology negative hematology ROS (+)   Anesthesia Other Findings   Reproductive/Obstetrics negative OB ROS                             Anesthesia Physical Anesthesia Plan  ASA: 2  Anesthesia Plan: General   Post-op Pain Management: Tylenol PO (pre-op)*   Induction: Intravenous  PONV Risk Score and Plan: 2 and Ondansetron, Dexamethasone and Treatment may vary due to age or medical condition  Airway Management Planned: LMA  Additional Equipment:   Intra-op Plan:   Post-operative Plan: Extubation in OR  Informed Consent: I have reviewed the patients History and Physical, chart, labs and discussed the procedure including the risks, benefits and alternatives for the proposed anesthesia with the patient or authorized representative who has indicated his/her understanding and acceptance.     Dental advisory given  Plan Discussed with: CRNA and Surgeon  Anesthesia Plan Comments:        Anesthesia Quick Evaluation

## 2022-12-27 NOTE — H&P (Signed)
Timothy Newton is an 62 y.o. male.    Chief Complaint: Pre-OP Bilateral Hydrocelectomy / Orchidopexy  HPI:   1 - Massive Hydroceles - massive hydrocel (estimate 1.5-2L) progressive for few eyars. Interfears with activities as trucker. Imaging at [facility] (not able to review) with hydrocele per report atound 2021. Had eval by Lelan Pons and plan for surger in 2022, but did not feel comfortable with that provider.   PMH sig for hand surgery. NO CV diesase / blood thinners. He ownes small long-haul truckign company, lots of contracts with [employer] and [employer]. His PCP is Jerilee Hoh with [facility].   Today " Timothy Newton " is seen to proceed with BILATERAL hydrocelectomy / orchidopexy. NO interval fevers. CT confirms no associated inguinal hernias, but a small bowel mass (?MALToma) is noted.    Past Medical History:  Diagnosis Date   Anemia    Bleeding gastric ulcer    Blood transfusion    H. 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 His smoking use included cigarettes. He started smoking about 26 years ago. He has a 5 pack-year smoking history. He has quit using smokeless tobacco. He reports that he does not drink alcohol and does not use drugs.  Allergies: No Known Allergies  No medications prior to admission.    Results for orders placed or performed during the hospital encounter of 12/25/22 (from the past 48 hour(s))  Basic metabolic panel per protocol     Status: Abnormal   Collection Time: 12/25/22  9:06 AM  Result Value Ref Range   Sodium 137 135 - 145 mmol/L    Potassium 4.2 3.5 - 5.1 mmol/L   Chloride 106 98 - 111 mmol/L   CO2 25 22 - 32 mmol/L   Glucose, Bld 108 (H) 70 - 99 mg/dL    Comment: Glucose reference range applies only to samples taken after fasting for at least 8 hours.   BUN 15 8 - 23 mg/dL   Creatinine, Ser 1.61 0.61 - 1.24 mg/dL   Calcium 9.8 8.9 - 09.6 mg/dL   GFR, Estimated >04 >54 mL/min    Comment: (NOTE) Calculated using the CKD-EPI Creatinine Equation (2021)    Anion gap 6 5 - 15    Comment: Performed at [facility], [address] [place], [place] [address]  CBC per protocol     Status: Abnormal   Collection Time: 12/25/22  9:06 AM  Result Value Ref Range   WBC 7.1 4.0 - 10.5 K/uL   RBC 4.16 (L) 4.22 - 5.81 MIL/uL   Hemoglobin 12.3 (L) 13.0 - 17.0 g/dL   HCT 91.4 78.2 - 95.6 %   MCV 95.2 80.0 - 100.0 fL   MCH 29.6 26.0 - 34.0 pg   MCHC 31.1 30.0 - 36.0 g/dL   RDW 21.3 08.6 - 57.8 %   Platelets 257 150 - 400 K/uL   nRBC 0.0 0.0 - 0.2 %    Comment: Performed at [facility], [address], [place], [place] [address]   No results found.  Review of Systems  Constitutional:  Negative for chills and fever.  All other systems reviewed and are negative.   There were no vitals taken for this visit. Physical Exam Vitals reviewed.  HENT:     Head: Normocephalic.  Eyes:     Pupils: Pupils are equal, round, and reactive to light.  Cardiovascular:     Rate and Rhythm: Normal rate.  Pulmonary:     Effort: Pulmonary effort is normal.  Abdominal:     General: Abdomen is flat.  Musculoskeletal:        General: Normal range of motion.     Cervical back: Normal range of motion.  Skin:    General: Skin is warm.  Neurological:     General: No focal deficit present.     Mental Status: He is alert.  Psychiatric:        Mood and Affect: Mood normal.      Assessment/Plan  Proceed as planned with BILATERAL hydrocelectomy / orchidopexy. Risks, benefits, alternatives, expected  peri-op course discussed previously and reiterated today including need for temporary scrotal drain. He will require significant soft tissue resection and rearrangement to achieve acceptable   Loletta Parish., MD 12/27/2022, 7:53 AM

## 2022-12-27 NOTE — Transfer of Care (Signed)
Immediate Anesthesia Transfer of Care Note  Patient: Timothy Newton  Procedure(s) Performed: BILATERAL HYDROCELECTOMY ADULT (Bilateral: Scrotum) BILATERAL ORCHIOPEXY ADULT (Bilateral: Scrotum)  Patient Location: PACU  Anesthesia Type:General  Level of Consciousness: awake, alert , oriented, and patient cooperative  Airway & Oxygen Therapy: Patient Spontanous Breathing and Patient connected to face mask oxygen  Post-op Assessment: Report given to RN and Post -op Vital signs reviewed and stable  Post vital signs: Reviewed and stable  Last Vitals:  Vitals Value Taken Time  BP 136/101 12/27/22 1623  Temp 37.1 C 12/27/22 1625  Pulse 63 12/27/22 1625  Resp 14 12/27/22 1625  SpO2 100 % 12/27/22 1625  Vitals shown include unfiled device data.  Last Pain:  Vitals:   12/27/22 1231  TempSrc:   PainSc: 0-No pain         Complications: No notable events documented.

## 2022-12-27 NOTE — Discharge Instructions (Signed)
1 -  All stitches are dissolvable and will disappear in about 3 weeks. No heavy lifting or straddle activity x 2 weeks. There will be pinkish discharge from drain area for 1-2 weeks. That is normal.   2 - Call MD or go to ER for fever >102, severe pain / nausea / vomiting not relieved by medications, or acute change in medical status

## 2022-12-28 ENCOUNTER — Encounter (HOSPITAL_COMMUNITY): Payer: Self-pay | Admitting: Urology

## 2022-12-30 NOTE — Telephone Encounter (Signed)
Spoke with pt and tried to get him scheduled for 2 possible dates that were available in December. Pt wants to wait until he can talk to Dr. Berneice Heinrich to make sure he is ok to proceed with the colon.States he will contact us after Thanksgiving to possibly schedule the colon. Dr. Rhea Belton aware.

## 2022-12-31 LAB — SURGICAL PATHOLOGY

## 2023-01-17 ENCOUNTER — Telehealth: Payer: Self-pay

## 2023-01-17 NOTE — Telephone Encounter (Signed)
----- Message from Carie Caddy Pyrtle sent at 12/31/2022  5:32 PM EST ----- Regarding: RE: New ileal mass Agree I recommend colonoscopy but he wanted to speak to Dr. Berneice Heinrich before scheduling with me Carie Caddy. Rhea Belton, M.D.  12/31/2022 ----- Message ----- From: Karie Soda, MD Sent: 12/30/2022   9:41 AM EST To: Ethlyn Gallery; Loletta Parish., MD; # Subject: RE: New ileal mass                             It looks pretty close to ileocecal valve to me.  However, it may be challenging to endoscopically get to the distal ileal mass, but some biopsies may be helpful.  Another option see if IR can biopsy it but I suspect they will balk.  MALT lymphoma at that location is pretty rare according to the literature.  If it is truly a "MALT lymphoma", treatment may be nonsurgical.    Suspect need to try and see if you can get biopsies and then add on to GI Tumor Board and regroup. ----- Message ----- From: Beverley Fiedler, MD Sent: 12/27/2022  11:21 AM EST To: Karie Soda, MD; Ethlyn Gallery; # Subject: RE: New ileal mass                             Ceirra Belli Copy to chart: Agree with recommendation for colonoscopy given abnormal CT of the abdomen pelvis suggesting soft tissue mass in the distal ileum It should be noted that this appears to be about 8 cm proximal to the IC valve and so we will only know if this is reachable by colonoscopy with ileal intubation once we attempt CT scan findings below This can be scheduled in the [facility] with me or next available provider if the wait for my next colonoscopy visit is lengthy JMP  IMPRESSION: 1. 4.3 cm soft tissue mass associated with the distal ileum located about 8 cm proximal to the ileocecal valve, favoring recurrent MALT lymphoma. Adjacent ileocolic lymph nodes measure up to about 1.1 cm in diameter. 2. Large left scrotal hydrocele, single internal septation. Volume approximally 1240 cc. Scrotal thickening on the left. 3. We partially include a region of  airspace opacity in the right middle lobe, suspicious for pneumonia. This opacity extends above the lung bases and is not fully characterized. Small right pleural effusion observed. 4. Mildly prominent lymph node in the right pericardial adipose tissue, not previously visible. 5. Lower lumbar spondylosis and degenerative disc disease causing impingement at L3-4, L4-5, and L5-S1. 6. Mild prostatomegaly. 7. Two supraumbilical ventral hernias contain adipose tissue.   Electronically Signed By: Gaylyn Rong M.D. On: 12/26/2022 17:55 ----- Message ----- From: Karie Soda, MD Sent: 12/27/2022  10:54 AM EST To: Ethlyn Gallery; Beverley Fiedler, MD Subject: New ileal mass                                 Alisha:  I was curbsided by Dr. Dwana Curd with [facility] who is sending me this patient.  He was planning to remove some large scrotal hydroceles & part of the workup was a CT scan of the abdomen.    Patient has a new mass at the ileocecal junction incidentally found on CAT scan.  Radiology wonders if this is a recurrent MALT lymphoma but that was in his  stomach in 2013.  Patient does not have any symptoms of obstruction or pain right now.  I think the patient could benefit from surgery to remove this (most likely robotic ileocolectomy) for this mass.  I think the patient needs a colonoscopy preop since his last one was in 2013 and Dr. Lauro Franklin notes talk about a 10-year follow-up anyway, let alone this new ileocecal mass.  I do not see one done recently.  See if Dr. Rhea Belton agrees.

## 2023-01-17 NOTE — Telephone Encounter (Signed)
Left message for pt to call back  °

## 2023-02-04 NOTE — Telephone Encounter (Signed)
Left message for pt to call back  °

## 2023-02-06 NOTE — Telephone Encounter (Signed)
 Finally able to reach pt by phone today. Pt states he is not interested in having the colonoscopy at this time and wants us  to take him off the list to call. Thanked us  for how diligent our office has been trying to get him scheduled. Let him know Dr. Albertus will be notified.

## 2023-02-06 NOTE — Telephone Encounter (Signed)
 Noted  The patient has been made aware of my recommendation for colonoscopy and declines at this time  Thank you for your persistence in contacting him

## 2023-10-19 ENCOUNTER — Ambulatory Visit (HOSPITAL_COMMUNITY)
Admission: EM | Admit: 2023-10-19 | Discharge: 2023-10-19 | Disposition: A | Discharge status: Home / Self Care | Attending: Emergency Medicine | Admitting: Emergency Medicine

## 2023-10-19 ENCOUNTER — Encounter (HOSPITAL_COMMUNITY): Payer: Self-pay | Admitting: Emergency Medicine

## 2023-10-19 ENCOUNTER — Ambulatory Visit (HOSPITAL_COMMUNITY)

## 2023-10-19 DIAGNOSIS — R9389 Abnormal findings on diagnostic imaging of other specified body structures: Secondary | ICD-10-CM

## 2023-10-19 DIAGNOSIS — R051 Acute cough: Secondary | ICD-10-CM | POA: Diagnosis not present

## 2023-10-19 DIAGNOSIS — J189 Pneumonia, unspecified organism: Secondary | ICD-10-CM | POA: Diagnosis not present

## 2023-10-19 MED ORDER — AZITHROMYCIN 250 MG PO TABS: ORAL_TABLET | ORAL | 0 refills | Status: DC

## 2023-10-19 MED ORDER — AMOXICILLIN-POT CLAVULANATE 875-125 MG PO TABS: 1.0000 | ORAL_TABLET | Freq: Two times a day (BID) | ORAL | 0 refills | Status: DC

## 2023-10-19 NOTE — ED Triage Notes (Signed)
 Pt st's I think I have walking pneumonia  I have had a cough for apporx 1 month.  Also I went walking last week and started to jog and noticed I was somewhat short of breath  No resp distress or wheezing noted at this time

## 2023-10-19 NOTE — ED Provider Notes (Signed)
 [facility]    CSN: [id number] Arrival date & time: 10/19/23  1151      History   Chief Complaint Chief Complaint  Patient presents with   Cough    HPI Timothy Newton is a 63 y.o. male.   Patient presents with concerns for cough that has been going on for approximately 1 month.  Patient states that earlier this week he did start to notice some chest congestion and also some shortness of breath with exertion.  Patient states that when lying down at night he hears some wheezing, and the other day when he was jogging he heard some wheezing in his breathing as well.  Denies fever, body aches, chills, and weakness.  Patient also denies chest pain or back pain.  Patient denies history of asthma or COPD.  Patient denies any known sick exposures.  Patient is concerned that he may have walking pneumonia.  The history is provided by the patient and medical records.  Cough   Past Medical History:  Diagnosis Date   Anemia    Bleeding gastric ulcer    Blood transfusion    H. 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   Gastric ulcer, acute with hemorrhage 03/26/2011   Acute posthemorrhagic anemia 03/26/2011    Past Surgical History:  Procedure Laterality Date   HYDROCELE EXCISION Bilateral 12/27/2022   Procedure: BILATERAL HYDROCELECTOMY ADULT;  Surgeon: Alvaro Ricardo KATHEE Mickey., MD;  Location: [facility];  Service: Urology;  Laterality: Bilateral;   ORCHIOPEXY Bilateral 12/27/2022   Procedure: BILATERAL ORCHIOPEXY ADULT;  Surgeon: Alvaro Ricardo KATHEE Mickey., MD;  Location: [facility];  Service: Urology;  Laterality:  Bilateral;  120 MINS FOR CASE   UPPER GASTROINTESTINAL ENDOSCOPY  2006, 2013       Home Medications    Prior to Admission medications   Medication Sig Start Date End Date Taking? Authorizing Provider  amoxicillin -clavulanate (AUGMENTIN ) 875-125 MG tablet Take 1 tablet by mouth every 12 (twelve) hours. 10/19/23  Yes Johnie, Retha Bither A, NP  azithromycin  (ZITHROMAX  Z-PAK) 250 MG tablet Take 2 pills (500mg ) first day and one pill (250mg ) the remaining 4 days. 10/19/23  Yes Johnie, Dorrine Montone A, NP  ibuprofen (ADVIL) 200 MG tablet Take 400 mg by mouth every 6 (six) hours as needed for moderate pain (pain score 4-6).    [provider]  lisinopril  (ZESTRIL ) 20 MG tablet Take 30 mg by mouth daily. 03/26/21   [provider]  oxyCODONE  (ROXICODONE ) 5 MG immediate release tablet Take 1 tablet (5 mg total) by mouth every 6 (six) hours as needed for moderate pain (pain score 4-6) or severe pain (pain score 7-10) (post-operatively). Patient not taking: Reported on 10/19/2023 12/27/22 12/27/23  Alvaro Ricardo KATHEE Mickey., MD  senna-docusate (SENOKOT-S) 8.6-50 MG tablet Take 1 tablet by mouth 2 (two) times daily. Patient not taking: Reported on 10/19/2023 12/27/22   Alvaro Ricardo KATHEE Mickey., MD    Family History Family History  Problem Relation Age of Onset   Colon cancer Neg Hx    Diabetes Neg Hx    Heart disease Neg Hx  Social History Social History   Tobacco Use   Smoking status: Former    Current packs/day: 0.00    Average packs/day: 1 pack/day for 5.0 years (5.0 ttl pk-yrs)    Types: Cigarettes    Start date: 03/18/1996    Quit date: 03/18/2001    Years since quitting: 22.6   Smokeless tobacco: Former  Building services engineer status: Never Used  Substance Use Topics   Alcohol use: No   Drug use: No     Allergies   Patient has no known allergies.   Review of Systems Review of Systems  Respiratory:  Positive for cough.    Per HPI  Physical Exam Triage Vital Signs ED  Triage Vitals  Encounter Vitals Group     BP 10/19/23 1228 137/84     Girls Systolic BP Percentile --      Girls Diastolic BP Percentile --      Boys Systolic BP Percentile --      Boys Diastolic BP Percentile --      Pulse Rate 10/19/23 1228 77     Resp 10/19/23 1228 18     Temp 10/19/23 1228 98 F (36.7 C)     Temp Source 10/19/23 1228 Oral     SpO2 10/19/23 1228 93 %     Weight --      Height --      Head Circumference --      Peak Flow --      Pain Score 10/19/23 1229 0     Pain Loc --      Pain Education --      Exclude from Growth Chart --    No data found.  Updated Vital Signs BP 137/84 (BP Location: Left Arm)   Pulse 77   Temp 98 F (36.7 C) (Oral)   Resp 18   SpO2 93%   Visual Acuity Right Eye Distance:   Left Eye Distance:   Bilateral Distance:    Right Eye Near:   Left Eye Near:    Bilateral Near:     Physical Exam Vitals and nursing note reviewed.  Constitutional:      General: He is awake. He is not in acute distress.    Appearance: Normal appearance. He is well-developed and well-groomed. He is not ill-appearing.  HENT:     Nose: Nose normal.     Mouth/Throat:     Mouth: Mucous membranes are moist.     Pharynx: Oropharynx is clear.  Cardiovascular:     Rate and Rhythm: Normal rate and regular rhythm.  Pulmonary:     Effort: Pulmonary effort is normal.     Breath sounds: Examination of the right-middle field reveals decreased breath sounds. Examination of the right-lower field reveals decreased breath sounds. Decreased breath sounds present.  Skin:    General: Skin is warm and dry.  Neurological:     Mental Status: He is alert.  Psychiatric:        Behavior: Behavior is cooperative.      UC Treatments / Results  Labs (all labs ordered are listed, but only abnormal results are displayed) Labs Reviewed - No data to display  EKG   Radiology DG Chest 2 View Result Date: 10/19/2023 CLINICAL DATA:  Cough and congestion for  approximately 1 month. EXAM: CHEST - 2 VIEW COMPARISON:  No recent chest imaging studies. FINDINGS: The heart is normal in size. Mild tortuosity of the thoracic aorta with scattered calcifications. Dense airspace consolidation  noted in the right upper lobe without obvious air bronchograms. There is also a moderate to large right pleural effusion with right lower lobe atelectasis. Although this certainly could represent pneumonia would expect the patient to be quite symptomatic and have elevated white count, etc. Could not exclude neoplasm. Chest CT with contrast is suggested for further evaluation and patient may require thoracentesis for diagnostic and therapeutic purposes. Mild peribronchial thickening noted in the left lung with some patchy central airspace opacity, possibly inflammation infection. No left-sided pleural effusion or obvious pulmonary lesions. The bony structures are intact.  No obvious bone lesions. IMPRESSION: 1. Dense airspace consolidation in the right upper lobe with moderate to large right pleural effusion and right lower lobe atelectasis. Although this certainly could represent pneumonia, could not exclude neoplasm. Recommend correlation with clinical findings as detailed above. As indicated, chest CT with contrast is suggested for further evaluation and patient may require thoracentesis for diagnostic and therapeutic purposes. 2. Mild peribronchial thickening and patchy central airspace opacity in the left lung, possibly inflammation or infection. Electronically Signed   By: MYRTIS Stammer M.D.   On: 10/19/2023 13:47    Procedures Procedures (including critical care time)  Medications Ordered in UC Medications - No data to display  Initial Impression / Assessment and Plan / UC Course  I have reviewed the triage vital signs and the nursing notes.  Pertinent labs & imaging results that were available during my care of the patient were reviewed by me and considered in my medical  decision making (see chart for details).     Patient is overall well-appearing and does not appear to be in any distress at this time.  Vitals are stable.  Upon exam diminished breath sounds auscultated in the right mid to lower lung.  No other significant findings on exam.  Chest x-ray ordered.  Radiology report revealed: 1. Dense airspace consolidation in the right upper lobe with moderate to large right pleural effusion and right lower lobe atelectasis. Although this certainly could represent pneumonia, could not exclude neoplasm. Recommend correlation with clinical findings as detailed above. As indicated, chest CT with contrast is suggested for further evaluation and patient may require thoracentesis for diagnostic and therapeutic purposes. 2. Mild peribronchial thickening and patchy central airspace opacity in the left lung, possibly inflammation or infection. I independently interpreted these images and agree with these findings at this time.  Prescribed Augmentin  and azithromycin  for pneumonia coverage.  Discussed with patient the importance of following up with primary care doctor to have a chest CT for further evaluation of these findings and patient is understanding and agreeable to this at this time.  Discussed with patient that the antibiotics may not be beneficial ultimately if findings are consistent with neoplasm versus infectious process, patient is understanding of this as well.  Discussed follow-up, return, and strict ER precautions. Final Clinical Impressions(s) / UC Diagnoses   Final diagnoses:  Acute cough  Community acquired pneumonia of right lung, unspecified part of lung  Abnormal chest x-ray     Discharge Instructions      I discussed your x-ray cannot exclude neoplasm.  Findings could indicate pneumonia, but it is recommended that you get a chest CT for further evaluation of this.  Start taking Augmentin  twice daily for 7 days. And start taking  azithromycin  2 tablets today and 1 tablet for the for many days for coverage of possible pneumonia.  Please follow-up with your primary care provider for further evaluation so they can  order outpatient chest CT.  If you develop chest pain or increased difficulty breathing please seek any medical treatment in the emergency department.  Below I have attached the radiology read of your x-ray:  IMPRESSION: 1. Dense airspace consolidation in the right upper lobe with moderate to large right pleural effusion and right lower lobe atelectasis. Although this certainly could represent pneumonia, could not exclude neoplasm. Recommend correlation with clinical findings as detailed above. As indicated, chest CT with contrast is suggested for further evaluation and patient may require thoracentesis for diagnostic and therapeutic purposes. 2. Mild peribronchial thickening and patchy central airspace opacity in the left lung, possibly inflammation or infection.     ED Prescriptions     Medication Sig Dispense Auth. Provider   amoxicillin -clavulanate (AUGMENTIN ) 875-125 MG tablet Take 1 tablet by mouth every 12 (twelve) hours. 14 tablet Johnie, Lakrista Scaduto A, NP   azithromycin  (ZITHROMAX  Z-PAK) 250 MG tablet Take 2 pills (500mg ) first day and one pill (250mg ) the remaining 4 days. 6 tablet Johnie Flaming A, NP      PDMP not reviewed this encounter.   Johnie Flaming A, NP 10/19/23 1401

## 2023-10-19 NOTE — Discharge Instructions (Addendum)
 I discussed your x-ray cannot exclude neoplasm.  Findings could indicate pneumonia, but it is recommended that you get a chest CT for further evaluation of this.  Start taking Augmentin  twice daily for 7 days. And start taking azithromycin  2 tablets today and 1 tablet for the for many days for coverage of possible pneumonia.  Please follow-up with your primary care provider for further evaluation so they can order outpatient chest CT.  If you develop chest pain or increased difficulty breathing please seek any medical treatment in the emergency department.  Below I have attached the radiology read of your x-ray:  IMPRESSION: 1. Dense airspace consolidation in the right upper lobe with moderate to large right pleural effusion and right lower lobe atelectasis. Although this certainly could represent pneumonia, could not exclude neoplasm. Recommend correlation with clinical findings as detailed above. As indicated, chest CT with contrast is suggested for further evaluation and patient may require thoracentesis for diagnostic and therapeutic purposes. 2. Mild peribronchial thickening and patchy central airspace opacity in the left lung, possibly inflammation or infection.

## 2023-11-05 ENCOUNTER — Other Ambulatory Visit (HOSPITAL_COMMUNITY): Payer: Self-pay | Admitting: Internal Medicine

## 2023-11-05 DIAGNOSIS — J90 Pleural effusion, not elsewhere classified: Secondary | ICD-10-CM

## 2023-11-09 ENCOUNTER — Emergency Department (HOSPITAL_COMMUNITY)

## 2023-11-09 ENCOUNTER — Emergency Department (HOSPITAL_COMMUNITY)
Admission: EM | Admit: 2023-11-09 | Discharge: 2023-11-09 | Disposition: A | Discharge status: Home / Self Care | Attending: Emergency Medicine | Admitting: Emergency Medicine

## 2023-11-09 ENCOUNTER — Encounter (HOSPITAL_COMMUNITY): Payer: Self-pay

## 2023-11-09 ENCOUNTER — Other Ambulatory Visit: Payer: Self-pay

## 2023-11-09 ENCOUNTER — Emergency Department (HOSPITAL_COMMUNITY)
Admission: EM | Admit: 2023-11-09 | Discharge: 2023-11-09 | Disposition: A | Discharge status: Home / Self Care | Source: Home / Self Care | Attending: Emergency Medicine | Admitting: Emergency Medicine

## 2023-11-09 DIAGNOSIS — Z79899 Other long term (current) drug therapy: Secondary | ICD-10-CM | POA: Diagnosis not present

## 2023-11-09 DIAGNOSIS — R079 Chest pain, unspecified: Secondary | ICD-10-CM | POA: Insufficient documentation

## 2023-11-09 DIAGNOSIS — Z87891 Personal history of nicotine dependence: Secondary | ICD-10-CM | POA: Diagnosis not present

## 2023-11-09 DIAGNOSIS — J90 Pleural effusion, not elsewhere classified: Secondary | ICD-10-CM | POA: Insufficient documentation

## 2023-11-09 DIAGNOSIS — I10 Essential (primary) hypertension: Secondary | ICD-10-CM | POA: Diagnosis not present

## 2023-11-09 DIAGNOSIS — R0602 Shortness of breath: Secondary | ICD-10-CM | POA: Diagnosis present

## 2023-11-09 LAB — CBC
HCT: 44.5 % (ref 39.0–52.0)
Hemoglobin: 13.8 g/dL (ref 13.0–17.0)
MCH: 29.4 pg (ref 26.0–34.0)
MCHC: 31 g/dL (ref 30.0–36.0)
MCV: 94.9 fL (ref 80.0–100.0)
Platelets: 194 K/uL (ref 150–400)
RBC: 4.69 MIL/uL (ref 4.22–5.81)
RDW: 13.3 % (ref 11.5–15.5)
WBC: 7 K/uL (ref 4.0–10.5)
nRBC: 0 % (ref 0.0–0.2)

## 2023-11-09 LAB — BODY FLUID CELL COUNT WITH DIFFERENTIAL
Lymphs, Fluid: 59 %
Monocyte-Macrophage-Serous Fluid: 36 % — ABNORMAL LOW (ref 50–90)
Neutrophil Count, Fluid: 5 % (ref 0–25)
Total Nucleated Cell Count, Fluid: 17963 uL — ABNORMAL HIGH (ref 0–1000)

## 2023-11-09 LAB — PROTEIN, PLEURAL OR PERITONEAL FLUID: Total protein, fluid: 8.4 g/dL

## 2023-11-09 LAB — BASIC METABOLIC PANEL WITH GFR
Anion gap: 10 (ref 5–15)
BUN: 11 mg/dL (ref 8–23)
CO2: 23 mmol/L (ref 22–32)
Calcium: 9.9 mg/dL (ref 8.9–10.3)
Chloride: 106 mmol/L (ref 98–111)
Creatinine, Ser: 1.05 mg/dL (ref 0.61–1.24)
GFR, Estimated: >60 mL/min (ref >60)
Glucose, Bld: 107 mg/dL — ABNORMAL HIGH (ref 70–99)
Potassium: 3.8 mmol/L (ref 3.5–5.1)
Sodium: 139 mmol/L (ref 135–145)

## 2023-11-09 LAB — LACTATE DEHYDROGENASE, PLEURAL OR PERITONEAL FLUID: LD, Fluid: 460 U/L — ABNORMAL HIGH (ref 3–23)

## 2023-11-09 LAB — TROPONIN T, HIGH SENSITIVITY
Troponin T High Sensitivity: <15 ng/L (ref 0–19)
Troponin T High Sensitivity: <15 ng/L (ref 0–19)

## 2023-11-09 LAB — GLUCOSE, PLEURAL OR PERITONEAL FLUID: Glucose, Fluid: 71 mg/dL

## 2023-11-09 LAB — ALBUMIN, PLEURAL OR PERITONEAL FLUID: Albumin, Fluid: 2.3 g/dL

## 2023-11-09 MED ORDER — LIDOCAINE HCL 2 % IJ SOLN
10.0000 mL | Freq: Once | INTRAMUSCULAR | Status: AC
  Administered 2023-11-09: 200 mg
  Filled 2023-11-09: qty 20

## 2023-11-09 MED ORDER — AMOXICILLIN-POT CLAVULANATE 875-125 MG PO TABS: 1.0000 | ORAL_TABLET | Freq: Two times a day (BID) | ORAL | 0 refills | Status: DC

## 2023-11-09 MED ORDER — FENTANYL CITRATE PF 50 MCG/ML IJ SOSY
50.0000 ug | PREFILLED_SYRINGE | Freq: Once | INTRAMUSCULAR | Status: AC
  Administered 2023-11-09: 50 ug via INTRAVENOUS
  Filled 2023-11-09: qty 1

## 2023-11-09 MED ORDER — IOHEXOL 350 MG/ML SOLN
75.0000 mL | Freq: Once | INTRAVENOUS | Status: AC | PRN
  Administered 2023-11-09: 75 mL via INTRAVENOUS

## 2023-11-09 MED ORDER — OXYCODONE-ACETAMINOPHEN 5-325 MG PO TABS
2.0000 | ORAL_TABLET | Freq: Once | ORAL | Status: AC
  Administered 2023-11-09: 2 via ORAL
  Filled 2023-11-09: qty 2

## 2023-11-09 MED ORDER — OXYCODONE-ACETAMINOPHEN 5-325 MG PO TABS: 1.0000 | ORAL_TABLET | Freq: Four times a day (QID) | ORAL | 0 refills | Status: AC | PRN

## 2023-11-09 MED ORDER — DOXYCYCLINE HYCLATE 100 MG PO CAPS: 100.0000 mg | ORAL_CAPSULE | Freq: Two times a day (BID) | ORAL | 0 refills | Status: DC

## 2023-11-09 MED ORDER — LABETALOL HCL 5 MG/ML IV SOLN
10.0000 mg | Freq: Once | INTRAVENOUS | Status: AC
  Administered 2023-11-09: 10 mg via INTRAVENOUS
  Filled 2023-11-09: qty 4

## 2023-11-09 MED ORDER — FUROSEMIDE 40 MG PO TABS: 40.0000 mg | ORAL_TABLET | Freq: Every day | ORAL | 0 refills | Status: DC

## 2023-11-09 NOTE — ED Triage Notes (Addendum)
 Pt came in for SOB, chest spasms and hypertension that's been causing issues today. Pt stated it's not chest pain but only spasms from trying to breath. Pt pharmacist asked if he could get a breathing treatment during his visit. Pt also came to the ER earlier today and was told to come back if the symptoms worsening.

## 2023-11-09 NOTE — Discharge Instructions (Signed)
 Your CT scan shows you might have pneumonia though you definitely still need to get the outpatient procedure to analyze the fluid in your lungs later this week.  We are putting you on antibiotics.  Your workup today does not show any evidence of a heart attack or blood clot though it cannot fully rule out heart disease and so you need to follow-up closely with your primary care provider.  If you develop recurrent, continued, or worsening chest pain, shortness of breath, fever, vomiting, abdominal or back pain, or any other new/concerning symptoms then return to the ER for evaluation.

## 2023-11-09 NOTE — ED Provider Notes (Signed)
 [facility] AT [facility] Provider Note   CSN: [id number] Arrival date & time: 11/09/23  1517     Patient presents with: Shortness of Breath and Hypertension   Timothy Newton is a 63 y.o. male history of hypertension, possible lung mass here presenting with shortness of breath.  Patient had seen pulmonology outpatient and scheduled for IR thoracentesis next week.  Patient came in last night with worsening shortness of breath.  Patient had a CT scan done that showed lung mass with large pleural effusion.  Patient was discharged home this morning on antibiotics.  He states that since discharge, he has worsening shortness of breath.  Patient denies any fevers.   The history is provided by the patient.       Prior to Admission medications   Medication Sig Start Date End Date Taking? Authorizing Provider  amoxicillin -clavulanate (AUGMENTIN ) 875-125 MG tablet Take 1 tablet by mouth every 12 (twelve) hours. 11/09/23   Freddi Hamilton, MD  doxycycline (VIBRAMYCIN) 100 MG capsule Take 1 capsule (100 mg total) by mouth 2 (two) times daily. 11/09/23   Freddi Hamilton, MD  ibuprofen (ADVIL) 200 MG tablet Take 400 mg by mouth every 6 (six) hours as needed for moderate pain (pain score 4-6).    [provider]  lisinopril  (ZESTRIL ) 20 MG tablet Take 30 mg by mouth daily. 03/26/21   [provider]  oxyCODONE  (ROXICODONE ) 5 MG immediate release tablet Take 1 tablet (5 mg total) by mouth every 6 (six) hours as needed for moderate pain (pain score 4-6) or severe pain (pain score 7-10) (post-operatively). Patient not taking: Reported on 10/19/2023 12/27/22 12/27/23  Alvaro Ricardo KATHEE Mickey., MD  senna-docusate (SENOKOT-S) 8.6-50 MG tablet Take 1 tablet by mouth 2 (two) times daily. Patient not taking: Reported on 10/19/2023 12/27/22   Alvaro Ricardo KATHEE Mickey., MD    Allergies: Patient has no known allergies.    Review of Systems  Respiratory:  Positive for  shortness of breath.   All other systems reviewed and are negative.   Updated Vital Signs BP (!) 171/127 (BP Location: Left Arm)   Pulse 85   Temp 97.9 F (36.6 C) (Oral)   Resp (!) 24   SpO2 95%   Physical Exam Vitals and nursing note reviewed.  HENT:     Head: Normocephalic.     Mouth/Throat:     Mouth: Mucous membranes are moist.  Eyes:     Extraocular Movements: Extraocular movements intact.     Pupils: Pupils are equal, round, and reactive to light.  Cardiovascular:     Rate and Rhythm: Normal rate and regular rhythm.  Pulmonary:     Comments: Tachypneic and no breath sounds on the right side Musculoskeletal:        General: Normal range of motion.     Cervical back: Normal range of motion and neck supple.  Skin:    General: Skin is warm.     Capillary Refill: Capillary refill takes less than 2 seconds.  Neurological:     General: No focal deficit present.     Mental Status: He is alert and oriented to person, place, and time.  Psychiatric:        Mood and Affect: Mood normal.        Behavior: Behavior normal.     (all labs ordered are listed, but only abnormal results are displayed) Labs Reviewed  BODY FLUID CULTURE W GRAM STAIN  ALBUMIN, PLEURAL OR PERITONEAL FLUID  AMYLASE, PLEURAL OR PERITONEAL FLUID                GLUCOSE, PLEURAL OR PERITONEAL FLUID  LACTATE DEHYDROGENASE, PLEURAL OR PERITONEAL FLUID  PROTEIN, PLEURAL OR PERITONEAL FLUID  CYTOLOGY - NON PAP    EKG: EKG Interpretation Date/Time:  Sunday November 09 2023 15:26:24 EDT Ventricular Rate:  94 PR Interval:  162 QRS Duration:  82 QT Interval:  324 QTC Calculation: 406 R Axis:   74  Text Interpretation: Sinus rhythm ST elevation, consider inferior injury No significant change since last tracing Confirmed by Patt Alm DEL [id number]) on 11/09/2023 4:15:29 PM  Radiology: CT Angio Chest Pulmonary Embolism (PE) W or WO Contrast Result Date: 11/09/2023 CLINICAL DATA:  Pulmonary embolism  suspected.  Lung mass. EXAM: CT ANGIOGRAPHY CHEST WITH CONTRAST TECHNIQUE: Multidetector CT imaging of the chest was performed using the standard protocol during bolus administration of intravenous contrast. Multiplanar CT image reconstructions and MIPs were obtained to evaluate the vascular anatomy. RADIATION DOSE REDUCTION: This exam was performed according to the departmental dose-optimization program which includes automated exposure control, adjustment of the mA and/or kV according to patient size and/or use of iterative reconstruction technique. CONTRAST:  75mL OMNIPAQUE  IOHEXOL  350 MG/ML SOLN COMPARISON:  Chest x-ray from earlier same day.  Chest CT 07/16/2011 FINDINGS: Cardiovascular: The heart size is normal. No substantial pericardial effusion. Coronary artery calcification is evident. Mild atherosclerotic calcification is noted in the wall of the thoracic aorta. There is no filling defect within the opacified pulmonary arteries to suggest the presence of an acute pulmonary embolus. Mediastinum/Nodes: 13 mm short axis precarinal lymph node evident. 13 mm short axis subcarinal lymph node evident. Consolidative opacity is seen in both hilar regions. The esophagus has normal imaging features. 9 mm short axis lymph node identified in the posterior left axilla. Lungs/Pleura: Dense consolidative airspace opacity is identified in the right upper lobe with air bronchograms and potential areas of cystic bronchiectasis or peripheral cavitation anteriorly (image 59/4). Consolidative airspace opacity noted right middle lobe with collapse/consolidation in the right lower lobe. Central consolidative opacity seen in the parahilar posterior left upper lobe. 12 mm anterior left upper lobe nodule seen on 69/12 with 9 mm lingular nodule on 84/12. Upper Abdomen: Visualized portion of the upper abdomen shows no acute findings. Musculoskeletal: No worrisome lytic or sclerotic osseous abnormality. Review of the MIP images  confirms the above findings. IMPRESSION: 1. No CT evidence for acute pulmonary embolus. 2. Dense consolidative airspace opacity in the right upper lobe with air bronchograms and potential areas of cystic bronchiectasis or peripheral cavitation anteriorly. Patient had a 2.7 x 2.6 cm nodular consolidative opacity in the right apex on the remote study from 2013. Consolidative airspace opacity in the right middle lobe with collapse/consolidation in the right lower lobe. Central consolidative opacity in the parahilar posterior left upper lobe. Imaging features are compatible with multifocal pneumonia. Given findings on the remote study, close follow-up warranted to exclude underlying neoplasm. 3. 12 mm anterior left upper lobe nodule with 9 mm lingular nodule. These may be infectious/inflammatory, but metastatic disease is not excluded. 4. Mild mediastinal lymphadenopathy, potentially reactive. 5.  Aortic Atherosclerosis (ICD10-I70.0). Electronically Signed   By: Camellia Candle M.D.   On: 11/09/2023 07:07   DG Chest 2 View Result Date: 11/09/2023 CLINICAL DATA:  Right lung mass. EXAM: CHEST - 2 VIEW COMPARISON:  10/19/2023 FINDINGS: Focal dense mass-like opacity in the right upper hemithorax is compatible with the reported history of lung mass.  This is progressive in the interval since the prior study. Asymmetric elevation right hemidiaphragm with right base collapse/consolidation again noted with interval increase in right pleural effusion. Patchy airspace disease in the parahilar left lung and left lung base is progressive since prior with small left pleural effusion evident. The cardio pericardial silhouette is enlarged. Telemetry leads overlie the chest. IMPRESSION: 1. Interval progression of right upper lobe mass-like opacity. Records in epic Care everywhere document an outside chest CT with contrast dated 10/28/2023 describing a masslike area in the right apex concerning for malignancy. 2. Interval increase in  right pleural effusion with right base collapse/consolidation. 3. Interval progression of patchy airspace disease in the parahilar left lung and left lung base with small left pleural effusion. Electronically Signed   By: Camellia Candle M.D.   On: 11/09/2023 05:18     THORACENTESIS BEDSIDE  Date/Time: 11/09/2023 4:45 PM  Performed by: Patt Alm Macho, MD Authorized by: Patt Alm Macho, MD   Consent:    Consent obtained:  Verbal   Consent given by:  Patient   Risks, benefits, and alternatives were discussed: yes     Risks discussed:  Bleeding, pain and pneumothorax   Alternatives discussed:  No treatment Universal protocol:    Procedure explained and questions answered to patient or proxy's satisfaction: yes     Patient identity confirmed:  Verbally with patient Sedation:    Sedation type:  None Anesthesia:    Anesthesia method:  Local infiltration   Local anesthetic:  Lidocaine  2% w/o epi Procedure details:    Preparation: Patient was prepped and draped in usual sterile fashion     Patient position:  Sitting   Location:  R midscapular line   Intercostal space:  7th   Puncture method:  Over-the-needle catheter   Ultrasound guidance: yes     Indwelling catheter placed: no     Needle gauge:  18   Catheter size:  18 G   Number of attempts:  1   Drainage characteristics:  Bloody Post-procedure details:    Chest x-ray performed: yes     Chest x-ray findings:  Pleural effusion improved   Procedure completion:  Tolerated      Medications Ordered in the ED  lidocaine  (XYLOCAINE ) 2 % (with pres) injection 200 mg (200 mg Infiltration Given by Other 11/09/23 1554)                                    Medical Decision Making Bland Rudzinski is a 63 y.o. male here presenting with shortness of breath.  I reviewed patient's records from earlier today and patient has large pleural effusion and possible lung mass versus infection.  I discussed with him in detail regarding  thoracentesis to both help with diagnosis and also improve his symptoms.  He is agreeable to the procedure so we will proceed with thoracentesis  4:47 PM I was able to remove around 1 L of bloody fluid.  Patient is feeling better.  Chest x-ray showed no pneumothorax and improved pleural effusion.  Thoracentesis fluid sent to the lab.  9:20 PM .  Patient is feeling well.  His oxygen is maintained above 90%.  Patient thoracentesis fluid showed 17,000 white blood cell count.  Patient does have mesothelial cells.  Discussed with pulmonologist, Dr. Claudene.  He states that if patient is feeling better, patient can follow-up with his outpatient pulmonologist.  He does have another  thoracentesis scheduled on Friday.  Based on this chest x-ray, he still has a large pleural effusion and may need another thoracentesis.  I told him to return to the ER if he has worsening shortness of breath   Problems Addressed: Pleural effusion: chronic illness or injury  Amount and/or Complexity of Data Reviewed Labs: ordered. Decision-making details documented in ED Course. Radiology: ordered and independent interpretation performed. Decision-making details documented in ED Course.  Risk Prescription drug management.     Final diagnoses:  None    ED Discharge Orders     None          Patt Alm Macho, MD 11/09/23 2124

## 2023-11-09 NOTE — ED Provider Notes (Addendum)
 Care transferred to me.  CT does not show PE.  Second troponin is also completely negative.  Low suspicion for ACS. Will treat for possible pneumonia though he has a thoracentesis scheduled later this week due to this possible mass. O2 sats in room are currently normal.  He understands all this.  Will discharge home with return precautions.    Freddi Hamilton, MD 11/09/23 [phone]

## 2023-11-09 NOTE — Progress Notes (Signed)
 11/09/2023 Case reviewed.  2013 had R apical mass lost to f/u. 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 Kae done and patient feels better. Cyto pending.  He can either f/u with [facility] as he has established care with Dr. Javaid or he can f/u with [facility].  I suspect we will need additional tissue for adequate biomarker staining but this can be done on OP basis.  Rolan Sharps MD PCCM

## 2023-11-09 NOTE — Discharge Instructions (Signed)
 As we discussed you have fluid in the right side of your lung  Please continue taking your antibiotic as prescribed  I have prescribed Lasix 40 mg daily for 5 days  I have also prescribed Percocet as needed for pain  See your doctor for follow-up.  Please keep your appointment for thoracentesis.  Please also discuss your results from today with your pulmonologist  Return to ER if you have worse shortness of breath or trouble breathing or fever

## 2023-11-09 NOTE — ED Provider Notes (Signed)
**Note Timothy-Identified via Obfuscation**  [facility]ERGENC[facility] DEPT [facility] Emergency Department Provider Note MRN:  [id number]  Arrival date & time: 11/09/23     Chief Complaint   Chest Pain   History of Present Illness   Timothy Newton is a 63 y.o. year-old male with a history of hyper presenting to the ED with chief complaint of chest pain.  Woke up from sleep with some left collarbone pain, would not go away.  Associated with some shortness of breath.  Also noted that his blood pressure is quite high, usually it is well-controlled.  Some nausea.  Review of Systems  A thorough review of systems was obtained and all systems are negative except as noted in the HPI and PMH.   Patient's Health History    Past Medical History:  Diagnosis Date   Anemia    Bleeding gastric ulcer    Blood transfusion    H. 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   s/p 14 days Nexium, clarithromycin and amoxicillin     Past Surgical History:  Procedure Laterality Date   HYDROCELE EXCISION Bilateral 12/27/2022   Procedure: BILATERAL HYDROCELECTOMY ADULT;  Surgeon: Alvaro Ricardo KATHEE Mickey., MD;  Location: [facility] ORS;  Service: Urology;  Laterality: Bilateral;   ORCHIOPEXY Bilateral 12/27/2022   Procedure: BILATERAL ORCHIOPEXY ADULT;  Surgeon: Alvaro Ricardo KATHEE Mickey., MD;  Location: [facility] ORS;  Service: Urology;  Laterality: Bilateral;  120 MINS FOR CASE   UPPER GASTROINTESTINAL ENDOSCOPY  2006, 2013    Family History  Problem Relation Age of Onset   Colon cancer Neg Hx    Diabetes Neg Hx    Heart disease Neg Hx     Social History   Socioeconomic History   Marital status: Married    Spouse name: Not on file   Number of children: 2   Years of education: Not on file   Highest education level: Not on file  Occupational History    Employer: [id number]  Tobacco Use   Smoking status: Former    Current packs/day: 0.00    Average packs/day: 1 pack/day for  5.0 years (5.0 ttl pk-yrs)    Types: Cigarettes    Start date: 03/18/1996    Quit date: 03/18/2001    Years since quitting: 22.6   Smokeless tobacco: Former  Building services engineer status: Never Used  Substance and Sexual Activity   Alcohol use: No   Drug use: No   Sexual activity: Yes  Other Topics Concern   Not on file  Social History Narrative   Not on file   Social Drivers of Health   Financial Resource Strain: Low Risk  (04/03/2023)   Received from [facility]   Overall Financial Resource Strain (CARDIA)    Difficulty of Paying Living Expenses: Not hard at all  Food Insecurity: No Food Insecurity (04/03/2023)   Received from [facility]   Hunger Vital Sign    Within the past 12 months, you worried that your food would run out before you got the money to buy more.: Never true    Within the past 12 months, the food you bought just didn't last and you didn't have money to get more.: Never true  Transportation Needs: No Transportation Needs (04/03/2023)   Received from [facility] - Transportation    Lack of Transportation (Medical): No    Lack of Transportation (Non-Medical): No  Physical Activity: Sufficiently Active (01/24/2021)  Received from [facility]   Exercise Vital Sign    On average, how many days per week do you engage in moderate to strenuous exercise (like a brisk walk)?: 5 days    On average, how many minutes do you engage in exercise at this level?: 60 min  Stress: No Stress Concern Present (01/24/2021)   Received from [facility] - Occupational Stress Questionnaire    Feeling of Stress : Not at all  Social Connections: Unknown (06/12/2021)   Received from [facility]   Social Network    Social Network: Not on file  Intimate Partner Violence: Unknown (05/09/2021)   Received from [facility]   HITS    Physically Hurt: Not on file    Insult or Talk Down To: Not on file    Threaten Physical Harm:  Not on file    Scream or Curse: Not on file     Physical Exam   Vitals:   11/09/23 0437 11/09/23 0545  BP:  (!) 147/88  Pulse:  64  Resp:  19  Temp: 98.7 F (37.1 C)   SpO2:  99%    CONSTITUTIONAL: Well-appearing, NAD NEURO/PSYCH:  Alert and oriented x 3, no focal deficits EYES:  eyes equal and reactive ENT/NECK:  no LAD, no JVD CARDIO: Regular rate, well-perfused, normal S1 and S2 PULM:  CTAB no wheezing or rhonchi GI/GU:  non-distended, non-tender MSK/SPINE:  No gross deformities, no edema SKIN:  no rash, atraumatic   *Additional and/or pertinent findings included in MDM below  Diagnostic and Interventional Summary    EKG Interpretation Date/Time:  Sunday November 09 2023 04:34:18 EDT Ventricular Rate:  87 PR Interval:  155 QRS Duration:  86 QT Interval:  370 QTC Calculation: 448 R Axis:   69  Text Interpretation: Sinus rhythm Confirmed by Theadore Sharper [phone]) on 11/09/2023 4:36:27 AM       Labs Reviewed  CBC  BASIC METABOLIC PANEL WITH GFR  TROPONIN T, HIGH SENSITIVITY  TROPONIN T, HIGH SENSITIVITY    DG Chest 2 View  Final Result    CT Angio Chest Pulmonary Embolism (PE) W or WO Contrast    (Results Pending)    Medications  labetalol (NORMODYNE) injection 10 mg (10 mg Intravenous Given 11/09/23 0453)  fentaNYL  (SUBLIMAZE ) injection 50 mcg (50 mcg Intravenous Given 11/09/23 0453)     Procedures  /  Critical Care Procedures  ED Course and Medical Decision Making  Initial Impression and Ddx Differential diagnosis includes MSK pain, ACS, hypertensive urgency.  Blood pressure over 200 systolic, providing pain medicine, blood pressure management, awaiting labs.  EKG without obvious concerns.  Past medical/surgical history that increases complexity of ED encounter: Hypertension  Interpretation of Diagnostics I personally reviewed the EKG and my interpretation is as follows: Sinus rhythm without obvious concerning ischemia  Labs pending, chest x-ray  revealing possibly increased size of lung mass.  Patient Reassessment and Ultimate Disposition/Management     Patient feeling better, blood pressure improved.  Will obtain CTA to exclude PE and evaluate for worsening signs of lung mass.  Signed out to oncoming provider at shift change.  Patient management required discussion with the following services or consulting groups:  None  Complexity of Problems Addressed Acute illness or injury that poses threat of life of bodily function  Additional Data Reviewed and Analyzed Further history obtained from: Further history from spouse/family member  Additional Factors Impacting ED Encounter Risk Consideration of hospitalization  Sharper  EMERSON Poisson, MD [facility] Emergency Medicine [facility] [email or web address]  Final Clinical Impressions(s) / ED Diagnoses     ICD-10-CM   1. Chest pain, unspecified type  R07.9       ED Discharge Orders     None        Discharge Instructions Discussed with and Provided to Patient:   Discharge Instructions   None      Poisson Ozell HERO, MD 11/09/23 [phone]

## 2023-11-09 NOTE — ED Triage Notes (Signed)
 Pt presents via POV reporting woke up this am with a cramp in his left shoulder and reports moved down into his epigastric region. Reports hx HTN. Reports some SOB.

## 2023-11-10 LAB — AMYLASE, PLEURAL OR PERITONEAL FLUID: Amylase, Fluid: 58 U/L

## 2023-11-13 LAB — BODY FLUID CULTURE W GRAM STAIN: Culture: NO GROWTH

## 2023-11-13 LAB — CYTOLOGY - NON PAP

## 2023-11-14 ENCOUNTER — Encounter (HOSPITAL_COMMUNITY): Admission: RE | Admit: 2023-11-14 | Source: Ambulatory Visit

## 2023-11-14 ENCOUNTER — Encounter (HOSPITAL_COMMUNITY): Payer: Self-pay

## 2023-12-02 ENCOUNTER — Telehealth: Payer: Self-pay

## 2023-12-02 NOTE — Telephone Encounter (Signed)
 Copied from CRM [id number]. Topic: Referral - Question >> Nov 28, 2023  1:02 PM [name] wrote: Reason for CRM: Patient calling to check on referral - no referral on file, attempt to schedule - patient will find out which provider he was referred to specifically and call back to schedule. >> Dec 02, 2023 10:40 AM [name] wrote: patient states that he was told that a referral for Dr. Neysa would be sent from Dr. Javaid - patient is requesting a call back [phone]  Sending to front staff.

## 2023-12-08 NOTE — Progress Notes (Signed)
 Primary care provider: Lamar MARLA Cornet, MD Referring provider:     Lamar MARLA Cornet, MD  Assessment    1. Lung mass   2. Pleural effusion on right   3. MALT (mucosa associated lymphoid tissue) (*)     He is awaiting a new referral to pulmonary in [facility] for an EBUS. We reviewed the PET CT results in detail and he understands that he most likely has a cancer or lymphoma.   Plan   Lung mass - awaiting EBUS bronch in [place] which is close to him and he would like to transfer all his pulmonary needs with them  Ilieal mass - he will need a referral to GI but he may want this in [place] as well and his new pulmonary group or pcp can help with referral.   We are available in the interim if something is required   Annual influenza vaccination if no contraindications and pneumonia vaccination per current guidelines.    We discussed the diagnosis and treatment plan in detail and the patient expressed understanding.   Risks, benefits, and alternatives of the medications and treatment plan prescribed today were discussed, and patient expressed understanding.  Total time spent in this encounter on date of service was 45 minutes and does not include additional procedure time.  Including but not limited to activities such as reviewing patient records, obtaining or reviewing subjective medical history, obtaining or performing a history and physical examination, counseling and/or educating patient/family/caregiver, ordering prescription medication, tests, procedures, imaging, labs, referring to and communicating with other health care providers, documenting appropriate clinical information in the medical record electronic or other, interpreting results of prescribed tests/procedures, communicating those results to the patient/family/representative and coordinating patient care.   Orders Placed This Encounter  Procedures  . RESPIRATORY FLOW VOLUME LOOP       Subjective   Chief Complaint   Patient presents with  . Lung Mass    Spirometry completed     Patient ID:  Timothy Newton is a 63 y.o. male with history of MALT lymphoma and lung mass presents after a PET CT. We have referred him for thoracentesis, no labs sent,  and he had a bloody effusion, as well as an EBUS bronch which have not been done. He completed a pet ct that shows:  IMPRESSION: 1. Hypermetabolic bilateral consolidation or neoplasm. The changes are increased compared to 10/28/2023. Consider bronchoscopy for further evaluation. 2. Large right effusion with one area of hypermetabolic activity posteriorly. 3. Hypermetabolic distal ileal mass. Possibly lymphoma 4. Hypermetabolic mesenteric nodes  Severe consolidation or mass at the right apex measuring 83 mm with an SUV Max of 8.9.   Complete consolidation of the right middle lobe with SUV Max of 6.0. Complete consolidation of the right lower lobe with SUV Max of 4.9.   Consolidative change in the left upper lobe with SUV Max of 5.9   Large right pleural effusion. There is some hypermetabolic soft tissue in the posterior right pleural effusion on image 236 with SUV Max of 4.0..   Hypermetabolic lymph nodes in the ileocolic ligament. Lymph node on image 324 measures 11 mm with an SUV Max of 4.5.   Mass in the distal ileum on image 357 measuring 54 x 38 mm with an SUV Max of 13.4. This is not causing obstruction.  He has some dyspnea. No chest pain. No inhaler currently. He would like to transfer care to [facility] and has plans to see them in a  week.    Review of Systems  Constitutional:  Negative for fever.  Respiratory:  Positive for shortness of breath. Negative for cough.   Cardiovascular:  Negative for chest pain.  Gastrointestinal:  Negative for heartburn.  All other systems reviewed and are negative.   History   Past Medical History:  Diagnosis Date  . Hypertension    History reviewed. No pertinent surgical history. Social  History[1] Family History  Problem Relation Age of Onset  . No Known Problems Mother   . No Known Problems Father   . No Known Problems Sister   . No Known Problems Son   . No Known Problems Son   . Cancer Maternal Grandmother        Ovarian  . No Known Problems Maternal Grandfather   . No Known Problems Paternal Grandfather     Medication History      Medication Sig Dispense Refill  . albuterol sulfate HFA (VENTOLIN HFA) 108 (90 Base) MCG/ACT inhaler Inhale two puffs into the lungs every 6 (six) hours as needed for Wheezing. 1 g 3  . lisinopril  (PRINIVIL ,ZESTRIL ) 20 mg tablet TAKE 1 AND 1/2 TABLETS(30 MG) BY MOUTH DAILY 135 tablet 1  . Misc. Devices MISC Call cone and have a stat thoracentesis done at [facility] for a large right pleural effusion 1 each 0  . Misc. Devices MISC Referral to [facility] 1 each 0  . tadalafil  (CIALIS ) 20 MG tablet Take one tablet (20 mg dose) by mouth daily as needed for Erectile Dysfunction. 6 tablet 3   No current facility-administered medications for this visit.   Allergies[2]     I reviewed the patient's medical,surgical,social and family history. The medications and allergies have been reviewed and updated.   Objective  BP 140/90 (BP Location: Left Upper Arm, Patient Position: Sitting)   Pulse 78   Resp 16   Ht 6' 2 (1.88 m)   Wt 246 lb (111.6 kg)   SpO2 95%   BMI 31.58 kg/m   General appearance:  alert, appears stated age, and cooperative Eyes:    pupils are equal, round and reactive Nose:     normal Mouth:    moist, no thrush Neck:    supple, no significant adenopathy, no JVD Chest:    Decreased breath sounds left, right clear.  Heart:    normal rate, regular rhythm, normal S1, S2, no murmurs, rubs, clicks or gallops Abdomen:   soft, nontender, nondistended Extremities:   peripheral pulses normal,no  pitting edema, no clubbing   Pulmonary Function Testing / Spirometry  No results found. Radiology    Epworth  Sleepiness Scale       * No data to display          CXR:  No results found for this or any previous visit.    CT Scan: No results found for this or any previous visit.   No results found for this or any previous visit.   No results found for this or any previous visit.   No results found for this or any previous visit.   No results found for this or any previous visit.   No results found for this or any previous visit.   No results found for this or any previous visit.   Results for orders placed during the hospital encounter of 12/01/23  PET-CT Skull Base to Thighs Initial FDG  Narrative The INDICATION: history of lymphoma, right lung mass  TECHNIQUE: 11.2 millicuries of  F-18 FDG was administered intravenously. PET imaging was obtained from the skull base to the mid thighs. CT images were obtained for attenuation correction and localization purposes. Glucose level was 100 MG/DL. Time from injection to scan was 60 minutes. Comparison chest CT 11/09/2023  FINDINGS: Reference SUV Max of normal liver parenchyma is 3.2.  Severe consolidation or mass at the right apex measuring 83 mm with an SUV Max of 8.9.  Complete consolidation of the right middle lobe with SUV Max of 6.0. Complete consolidation of the right lower lobe with SUV Max of 4.9.  Consolidative change in the left upper lobe with SUV Max of 5.9  Large right pleural effusion. There is some hypermetabolic soft tissue in the posterior right pleural effusion on image 236 with SUV Max of 4.0..  Hypermetabolic lymph nodes in the ileocolic ligament. Lymph node on image 324 measures 11 mm with an SUV Max of 4.5.  Mass in the distal ileum on image 357 measuring 54 x 38 mm with an SUV Max of 13.4. This is not causing obstruction.  Spleen is normal in size and not hypermetabolic. No renal stone or hydronephrosis. Gallbladder present. No biliary ductal dilatation. Enlarged prostate. No concerning bone  lesions.  Impression IMPRESSION: 1. Hypermetabolic bilateral consolidation or neoplasm. The changes are increased compared to 10/28/2023. Consider bronchoscopy for further evaluation. 2. Large right effusion with one area of hypermetabolic activity posteriorly. 3. Hypermetabolic distal ileal mass. Possibly lymphoma 4. Hypermetabolic mesenteric nodes  Electronically Signed by: Glendia Guillaume, MD on 12/01/2023 11:47 AM   VQ Scan: No results found for this or any previous visit.   TTE:   No results found for this or any previous visit.  Labs   Lab Results  Component Value Date/Time   Glucose, POC 100 (H) 12/01/2023 09:03 AM   BUN 11 10/28/2023 04:49 PM   Creatinine 1.21 10/28/2023 04:49 PM   eGFR 68 10/28/2023 04:49 PM   BUN/Creatinine Ratio 9 (L) 10/28/2023 04:49 PM   Sodium 141 10/28/2023 04:49 PM   Potassium 4.1 10/28/2023 04:49 PM   Chloride 104 10/28/2023 04:49 PM   CO2 22 10/28/2023 04:49 PM   Total Protein 8.7 (H) 10/28/2023 04:49 PM   Albumin, Serum 4.1 10/28/2023 04:49 PM   Globulin, Total 4.6 (H) 10/28/2023 04:49 PM   Albumin/Globulin Ratio 1.8 12/07/2020 09:54 AM   Total Bilirubin 1.3 (H) 10/28/2023 04:49 PM   Alkaline Phosphatase 70 10/28/2023 04:49 PM   AST 15 10/28/2023 04:49 PM   ALT (SGPT) 12 10/28/2023 04:49 PM    Lab Results  Component Value Date/Time   WBC 6.1 10/28/2023 04:49 PM   RBC 4.65 10/28/2023 04:49 PM   Hemoglobin 13.7 10/28/2023 04:49 PM   Hematocrit 44.1 10/28/2023 04:49 PM   MCV 95 10/28/2023 04:49 PM   MCH 29.5 10/28/2023 04:49 PM   MCHC 31.1 (L) 10/28/2023 04:49 PM   RDW 12.1 10/28/2023 04:49 PM   Platelet Count 191 10/28/2023 04:49 PM   Neutrophils 47 10/28/2023 04:49 PM   Neutrophils Absolute 2.9 10/28/2023 04:49 PM   Lymphocytes Absolute 2.6 10/28/2023 04:49 PM   Lymphs Relative 42 10/28/2023 04:49 PM   Monocytes 6 10/28/2023 04:49 PM   Monocytes Absolute 0.4 10/28/2023 04:49 PM   Eos Relative 3 10/28/2023 04:49 PM    Eosinophils Absolute 0.2 10/28/2023 04:49 PM   Basos Relative  2 10/28/2023 04:49 PM   Basophils Absolute 0.1 10/28/2023 04:49 PM      Immunizations     Name Date  Dose VIS Date Route   Influenza 11/03/2023 0.5 mL 03/07/2023 Intramuscular   Site: Left Arm   Given By: Lavanda Pesa   Manufacturer: [employer]   Lot: [id number]   Influenza 11/04/2017 -- -- --   SARS-COV-2 (Covid-19) 05/05/2020 -- -- --   External: Patient reported   SARS-COV-2 (Covid-19) 11/16/2019 -- -- --   Manufacturer: [employer]   Lot: [id number]   External: Patient reported   SARS-COV-2 (Covid-19) 04/26/2019 -- -- --   Manufacturer: [employer]   Lot: [id number]   External: Patient reported   SARS-COV-2 (Covid-19) 04/05/2019 -- -- --   Manufacturer: [employer]   Lot: [id number]   External: Patient reported         Patient's Medications       * Accurate as of December 08, 2023  8:37 AM. Reflects encounter med changes as of last refresh          New Prescriptions      Instructions  albuterol sulfate HFA 108 (90 Base) MCG/ACT inhaler Commonly known as: VENTOLIN HFA Started by: Adnan Javaid  2 puffs, Inhalation, Every 6 hours as needed       Continued Medications      Instructions  lisinopril  20 mg tablet Commonly known as: PRINIVIL ,ZESTRIL   30 mg, Oral, Daily   * Misc. Devices Misc  Call cone and have a stat thoracentesis done at [facility] for a large right pleural effusion   * Misc. Devices Misc  Referral to [facility]   tadalafil  20 MG tablet Commonly known as: CIALIS   20 mg, Oral, Daily as needed      * * This list has 2 medication(s) that are the same as other medications prescribed for you. Read the directions carefully, and ask your doctor or other care provider to review them with you.           The patient was given a copy of the after visit summary.  Voice-recognition software was used in surveyor, minerals of this documentation. Unintended transcription errors may  have escaped editorial review.          [1] Social History Socioeconomic History  . Marital status: Single  Occupational History  . Occupation: [employer]ologist, [employer]  Tobacco Use  . Smoking status: Never    Passive exposure: Never  . Smokeless tobacco: Never  Vaping Use  . Vaping status: Never Used  Substance and Sexual Activity  . Alcohol use: Never  . Drug use: Never  Social History Narrative   ** Merged History Encounter **      [2] No Known Allergies *Some images could not be shown.

## 2023-12-15 ENCOUNTER — Ambulatory Visit

## 2023-12-15 VITALS — BP 126/82 | HR 110 | Temp 97.8°F | Ht 74.0 in | Wt 247.0 lb

## 2023-12-15 DIAGNOSIS — J90 Pleural effusion, not elsewhere classified: Secondary | ICD-10-CM

## 2023-12-15 DIAGNOSIS — C3491 Malignant neoplasm of unspecified part of right bronchus or lung: Secondary | ICD-10-CM

## 2023-12-15 DIAGNOSIS — J9811 Atelectasis: Secondary | ICD-10-CM

## 2023-12-15 DIAGNOSIS — R9389 Abnormal findings on diagnostic imaging of other specified body structures: Secondary | ICD-10-CM

## 2023-12-15 MED ORDER — AMOXICILLIN-POT CLAVULANATE 875-125 MG PO TABS: 1.0000 | ORAL_TABLET | Freq: Two times a day (BID) | ORAL | 0 refills | Status: DC

## 2023-12-15 NOTE — Progress Notes (Signed)
 New Patient Pulmonology Office Visit   Subjective:  Patient ID: Timothy Newton, male    DOB: April 13, 1960  MRN: [id number]  Referred by: Dorcus Lamar POUR, MD  CC:  Chief Complaint  Patient presents with   Consult    Pt states he has right collapsed lung, SOB occurs w/ any activity    Discussed the use of AI scribe software for clinical note transcription with the patient, who gave verbal consent to proceed.  History of Present Illness Phillips Goulette is a 63 year old male who presents with shortness of breath and new onset of Right pleural effusion. He was referred by pulmonary for EBUS.  Shortness of breath began on October 28, 2023, during light jogging and is more severe than typical exertion-related breathlessness. A chest x-ray in the ER revealed right pleural effusion. Thoracentesis provided minimal relief, with fluid analysis showing chronic inflammation lymphocytic exudative and pathology negative for malignancy. A PET scan two weeks ago indicated a large pleural effusion and hypermetabolic bilateral consolidation, changes increased compared to 10/28/2023. Hypermetabolic distal ileal mass, and mesenteric nodes, possible lymphoma.   Significant dyspnea persists, especially with minimal exertion.  He denies pulmonary conditions, smoking, or vaping. No history of heart problems, sarcoidosis, or autoimmune diseases.  There is no family history of lung cancer or lymphoma. 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   ROS as above.  Allergies: Patient has no known allergies.  Current Outpatient Medications:    amoxicillin -clavulanate (AUGMENTIN ) 875-125 MG tablet, Take 1 tablet by mouth every 12 (twelve) hours., Disp: 14 tablet, Rfl: 0   doxycycline (VIBRAMYCIN) 100 MG capsule, Take 1 capsule (100 mg total) by mouth 2 (two) times daily., Disp: 14 capsule, Rfl: 0   furosemide (LASIX) 40 MG tablet, Take 1 tablet  (40 mg total) by mouth daily., Disp: 5 tablet, Rfl: 0   ibuprofen (ADVIL) 200 MG tablet, Take 400 mg by mouth every 6 (six) hours as needed for moderate pain (pain score 4-6)., Disp: , Rfl:    lisinopril  (ZESTRIL ) 20 MG tablet, Take 30 mg by mouth daily., Disp: , Rfl:    oxyCODONE  (ROXICODONE ) 5 MG immediate release tablet, Take 1 tablet (5 mg total) by mouth every 6 (six) hours as needed for moderate pain (pain score 4-6) or severe pain (pain score 7-10) (post-operatively). (Patient not taking: Reported on 10/19/2023), Disp: 15 tablet, Rfl: 0   oxyCODONE -acetaminophen  (PERCOCET) 5-325 MG tablet, Take 1 tablet by mouth every 6 (six) hours as needed., Disp: 12 tablet, Rfl: 0   senna-docusate (SENOKOT-S) 8.6-50 MG tablet, Take 1 tablet by mouth 2 (two) times daily. (Patient not taking: Reported on 10/19/2023), Disp: 10 tablet, Rfl: 0 Past Medical History:  Diagnosis Date   Anemia    Bleeding gastric ulcer    Blood transfusion    H. 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   s/p 14 days Nexium, clarithromycin and amoxicillin    Past Surgical History:  Procedure Laterality Date   HYDROCELE EXCISION Bilateral 12/27/2022   Procedure: BILATERAL HYDROCELECTOMY ADULT;  Surgeon: Alvaro Ricardo KATHEE Mickey., MD;  Location: [facility];  Service: Urology;  Laterality: Bilateral;   ORCHIOPEXY Bilateral 12/27/2022   Procedure: BILATERAL ORCHIOPEXY ADULT;  Surgeon: Alvaro Ricardo KATHEE Mickey., MD;  Location: [facility];  Service: Urology;  Laterality: Bilateral;  120 MINS FOR CASE  UPPER GASTROINTESTINAL ENDOSCOPY  2006, 2013   Family History  Problem Relation Age of Onset   Colon cancer Neg Hx    Diabetes Neg Hx    Heart disease Neg Hx    Social History   Socioeconomic History   Marital status: Married    Spouse name: Not on file   Number of children: 2   Years of education: Not on file   Highest education level: Not on file   Occupational History    Employer: [id number]  Tobacco Use   Smoking status: Former    Current packs/day: 0.00    Average packs/day: 1 pack/day for 5.0 years (5.0 ttl pk-yrs)    Types: Cigarettes    Start date: 03/18/1996    Quit date: 03/18/2001    Years since quitting: 22.7   Smokeless tobacco: Former  Building Services Engineer status: Never Used  Substance and Sexual Activity   Alcohol use: No   Drug use: No   Sexual activity: Yes  Other Topics Concern   Not on file  Social History Narrative   Not on file   Social Drivers of Health   Financial Resource Strain: Low Risk  (04/03/2023)   Received from [facility]   Overall Financial Resource Strain (CARDIA)    Difficulty of Paying Living Expenses: Not hard at all  Food Insecurity: No Food Insecurity (04/03/2023)   Received from [facility]   Hunger Vital Sign    Within the past 12 months, you worried that your food would run out before you got the money to buy more.: Never true    Within the past 12 months, the food you bought just didn't last and you didn't have money to get more.: Never true  Transportation Needs: No Transportation Needs (04/03/2023)   Received from [facility] - Transportation    Lack of Transportation (Medical): No    Lack of Transportation (Non-Medical): No  Physical Activity: Sufficiently Active (01/24/2021)   Received from [facility]   Exercise Vital Sign    On average, how many days per week do you engage in moderate to strenuous exercise (like a brisk walk)?: 5 days    On average, how many minutes do you engage in exercise at this level?: 60 min  Stress: No Stress Concern Present (01/24/2021)   Received from [facility] - Occupational Stress Questionnaire    Feeling of Stress : Not at all  Social Connections: Unknown (06/12/2021)   Received from [facility]   Social Network    Social Network: Not on file  Intimate Partner Violence: Unknown  (05/09/2021)   Received from [facility]   HITS    Physically Hurt: Not on file    Insult or Talk Down To: Not on file    Threaten Physical Harm: Not on file    Scream or Curse: Not on file    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   Objective:  There were no vitals taken for this visit. Wt Readings from Last 3 Encounters:  12/15/23 247 lb (112 kg)  12/27/22 250 lb (113.4 kg)  12/25/22 250 lb (113.4 kg)   BMI Readings from Last 3 Encounters:  12/15/23 31.71 kg/m  12/27/22 32.10 kg/m  12/25/22 32.10 kg/m   SpO2 Readings from Last 3 Encounters:  12/15/23 93%  11/09/23 90%  11/09/23 90%    Physical Exam Constitutional:  Appearance: Normal appearance.  HENT:     Head: Normocephalic.     Mouth/Throat:     Mouth: Mucous membranes are moist.  Eyes:     Extraocular Movements: Extraocular movements intact.     Pupils: Pupils are equal, round, and reactive to light.  Cardiovascular:     Rate and Rhythm: Normal rate and regular rhythm.  Pulmonary:     Comments: Mild tachypnea. Decreased lung sounds on the 2/3 of the R lung. Normal Lung sounds on the left. Musculoskeletal:        General: Normal range of motion.     Comments: No LE edema.  Skin:    General: Skin is warm.  Neurological:     General: No focal deficit present.     Mental Status: He is alert and oriented to person, place, and time.    11/09/23 Thoracentesis: 1L of bloody fluid Glucose 71 LDH 460 Protein 8.4 Total nucleated cell count 17 963, lymphopcytic predominance Culture negative Pathology: no malignant cells, chronic inflammation  PET SCAN on 12/01/2023 only report available, no images Severe consolidation or mass at the right apex measuring 83 mm with an SUV Max of 8.9. Complete consolidation of the right middle lobe with SUV Max of 6.0. Complete consolidation of the right lower lobe with SUV Max of 4.9. Consolidative change in the left upper lobe with SUV Max of 5.9 Large right  pleural effusion. There is some hypermetabolic soft tissue in the posterior right pleural effusion on image 236 with SUV Max of 4.0.. Hypermetabolic lymph nodes in the ileocolic ligament. Lymph node on image 324 measures 11 mm with an SUV Max of 4.5. Mass in the distal ileum on image 357 measuring 54 x 38 mm with an SUV Max of 13.4. This is not causing obstruction. Spleen is normal in size and not hypermetabolic. No renal stone or hydronephrosis. Gallbladder present. No biliary ductal dilatation. Enlarged prostate. No concerning bone lesions.     IMPRESSION  1. Hypermetabolic bilateral consolidation or neoplasm. The changes are increased compared to 10/28/2023. Consider bronchoscopy for further evaluation. 2. Large right effusion with one area of hypermetabolic activity posteriorly. 3. Hypermetabolic distal ileal mass. Possibly lymphoma 4. Hypermetabolic mesenteric nodes   CT chest 11/09/23 1. No CT evidence for acute pulmonary embolus. 2. Dense consolidative airspace opacity in the right upper lobe with air bronchograms and potential areas of cystic bronchiectasis or peripheral cavitation anteriorly. Patient had a 2.7 x 2.6 cm nodular consolidative opacity in the right apex on the remote study from 2013. Consolidative airspace opacity in the right middle lobe with collapse/consolidation in the right lower lobe. Central consolidative opacity in the parahilar posterior left upper lobe. Imaging features are compatible with multifocal pneumonia. Given findings on the remote study, close follow-up warranted to exclude underlying neoplasm. 3. 12 mm anterior left upper lobe nodule with 9 mm lingular nodule. These may be infectious/inflammatory, but metastatic disease is not excluded. 4. Mild mediastinal lymphadenopathy, potentially reactive. 5.  Aortic Atherosclerosis (ICD10-I70.0).  CT chest 2013:  Hypermetabolic right apical mass/infiltrate has slightly  enlarged from 06/04/2011. The  persistence of this finding makes  infection slightly less plausible.  Lymphoma and primary  bronchogenic carcinoma cannot be excluded. Tissue sampling should  be considered.  2.  Additional pulmonary nodules and ground-glass opacities are  stable.  3. No lymphadenopathy.   PET 2013 1.  No abnormal hypermetabolic activity in the stomach to suggest  lymphoma recurrence.  2.  Extensive gastric mucosal thickening within  the stomach which  appears slightly increased compared to prior.  3.  Hypermetabolic nodule in the right upper lobe.  Deedra this to  represent a focus of infection or inflammation rather than  lymphoma metastasis.  Similar smaller nodules in the left upper  lobe and right middle lobe   US  performed in the clinic today 12/15/2023 I performed Right lung ultrasound in the office. There is 10 cm simple pleural effusion. No loculations. Hyperechoic diaphragm line, indicating possible thickness.   Assessment & Plan:   Assessment & Plan Large Right-sided pleural effusion with right lung collapse Dyspnea on exertion PET scan hypermetabolic consolidation or neoplasm.  Persistent right-sided pleural effusion with lung consolidation and collapse. He had a thoracentesis a few weeks ago without significant resolution of his symptoms. His pleural studies showed exudative lymphocytic fluid with negative pathology for malignancy, neg culture. CT and PET scans suggest infection vs neoplasm. He had  a CT chest in 2013 with hypermetabolic RUL mass and it seems he has lost follow up.   Differential includes chronic inflammation, vs likely malignancy. I performed a lung US  today and showed 10-12 cm simple R pleural effusion with some echogenicity, probably cellular fluid. Patient has dyspnea on exertion, and mild tachypnea at rest. O2 sat at rest 93%.  I highly recommend him to go to the ER, he will need a chest tube placement, to see if there is reexpansion of the lung. It is likely he had  pleural effusion for multiple weeks, and possible evolve into trapped lung but unknown if there is not evaluation of lung reexpansion. I discussed extensively with patient that my recommendation is to go to the ER sooner than later and he tells me he has a truck company and he needs to drive to [place] for a job today, and he will come back on Thursday, and he will go to the ER at that time. He understand the risks but unfortunately he can't cancel this job.  Plan: - Recommended ER visit for chest tube placement to drain pleural effusion. - Prescribed Augmentin  for 4 weeks.  - Requested PET scan images for further evaluation. - Advised ER visit if significant shortness of breath occurs during travel. - He will need EBUS and RUL biopsy while inpatient.    Return in about 4 weeks (around 01/12/2024).   Marny Patch, MD Pulmonary and Critical Care Medicine [facility]

## 2023-12-15 NOTE — Patient Instructions (Signed)
 Dear Mr. Timothy Newton;  I perform a ultrasound of your chest today, and there is a significant pleural effusion. After discussing with you, I highly recommend you to go to the ER for a chest tube placement, however it seems you have personal things to deal with it. I highly recommend after you come back go to the ER this week.  In the meantime, Augmentin  twice a day.  I will see you in 4 weeks.

## 2023-12-16 ENCOUNTER — Telehealth: Payer: Self-pay

## 2023-12-16 ENCOUNTER — Encounter (HOSPITAL_COMMUNITY): Payer: Self-pay

## 2023-12-16 ENCOUNTER — Inpatient Hospital Stay (HOSPITAL_COMMUNITY)
Admission: EM | Admit: 2023-12-16 | Discharge: 2023-12-25 | DRG: 841 | Disposition: A | Discharge status: Home / Self Care | Attending: Emergency Medicine | Admitting: Emergency Medicine

## 2023-12-16 ENCOUNTER — Other Ambulatory Visit: Payer: Self-pay

## 2023-12-16 ENCOUNTER — Emergency Department (HOSPITAL_COMMUNITY)

## 2023-12-16 DIAGNOSIS — I10 Essential (primary) hypertension: Secondary | ICD-10-CM | POA: Diagnosis present

## 2023-12-16 DIAGNOSIS — R590 Localized enlarged lymph nodes: Secondary | ICD-10-CM | POA: Diagnosis present

## 2023-12-16 DIAGNOSIS — J90 Pleural effusion, not elsewhere classified: Principal | ICD-10-CM | POA: Insufficient documentation

## 2023-12-16 DIAGNOSIS — C884 Extranodal marginal zone b-cell lymphoma of mucosa-associated lymphoid tissue (malt-lymphoma) not having achieved remission: Secondary | ICD-10-CM | POA: Diagnosis not present

## 2023-12-16 DIAGNOSIS — Z87891 Personal history of nicotine dependence: Secondary | ICD-10-CM

## 2023-12-16 DIAGNOSIS — T451X5A Adverse effect of antineoplastic and immunosuppressive drugs, initial encounter: Secondary | ICD-10-CM | POA: Diagnosis not present

## 2023-12-16 DIAGNOSIS — Z4682 Encounter for fitting and adjustment of non-vascular catheter: Secondary | ICD-10-CM

## 2023-12-16 DIAGNOSIS — R918 Other nonspecific abnormal finding of lung field: Secondary | ICD-10-CM | POA: Diagnosis present

## 2023-12-16 DIAGNOSIS — R911 Solitary pulmonary nodule: Secondary | ICD-10-CM | POA: Diagnosis present

## 2023-12-16 DIAGNOSIS — E669 Obesity, unspecified: Secondary | ICD-10-CM | POA: Diagnosis present

## 2023-12-16 DIAGNOSIS — N289 Disorder of kidney and ureter, unspecified: Secondary | ICD-10-CM | POA: Diagnosis present

## 2023-12-16 DIAGNOSIS — K5730 Diverticulosis of large intestine without perforation or abscess without bleeding: Secondary | ICD-10-CM | POA: Diagnosis present

## 2023-12-16 DIAGNOSIS — D630 Anemia in neoplastic disease: Secondary | ICD-10-CM | POA: Diagnosis present

## 2023-12-16 DIAGNOSIS — D62 Acute posthemorrhagic anemia: Secondary | ICD-10-CM

## 2023-12-16 DIAGNOSIS — J910 Malignant pleural effusion: Secondary | ICD-10-CM | POA: Diagnosis present

## 2023-12-16 DIAGNOSIS — Z792 Long term (current) use of antibiotics: Secondary | ICD-10-CM

## 2023-12-16 DIAGNOSIS — R509 Fever, unspecified: Secondary | ICD-10-CM | POA: Diagnosis present

## 2023-12-16 DIAGNOSIS — Z6831 Body mass index (BMI) 31.0-31.9, adult: Secondary | ICD-10-CM

## 2023-12-16 DIAGNOSIS — Z79899 Other long term (current) drug therapy: Secondary | ICD-10-CM

## 2023-12-16 LAB — PROTEIN, PLEURAL OR PERITONEAL FLUID

## 2023-12-16 LAB — BASIC METABOLIC PANEL WITH GFR
Anion gap: 8 (ref 5–15)
BUN: 15 mg/dL (ref 8–23)
CO2: 25 mmol/L (ref 22–32)
Calcium: 10.1 mg/dL (ref 8.9–10.3)
Chloride: 106 mmol/L (ref 98–111)
Creatinine, Ser: 1.28 mg/dL — ABNORMAL HIGH (ref 0.61–1.24)
GFR, Estimated: >60 mL/min (ref >60)
Glucose, Bld: 110 mg/dL — ABNORMAL HIGH (ref 70–99)
Potassium: 4.3 mmol/L (ref 3.5–5.1)
Sodium: 139 mmol/L (ref 135–145)

## 2023-12-16 LAB — CBC
HCT: 45.9 % (ref 39.0–52.0)
Hemoglobin: 14.3 g/dL (ref 13.0–17.0)
MCH: 29.2 pg (ref 26.0–34.0)
MCHC: 31.2 g/dL (ref 30.0–36.0)
MCV: 93.9 fL (ref 80.0–100.0)
Platelets: 245 K/uL (ref 150–400)
RBC: 4.89 MIL/uL (ref 4.22–5.81)
RDW: 12.8 % (ref 11.5–15.5)
WBC: 8.1 K/uL (ref 4.0–10.5)
nRBC: 0 % (ref 0.0–0.2)

## 2023-12-16 LAB — HIV ANTIBODY (ROUTINE TESTING W REFLEX): HIV Screen 4th Generation wRfx: NONREACTIVE

## 2023-12-16 LAB — FERRITIN: Ferritin: 37 ng/mL (ref 24–336)

## 2023-12-16 LAB — C-REACTIVE PROTEIN: CRP: 6.3 mg/dL — ABNORMAL HIGH (ref <1.0)

## 2023-12-16 LAB — CK: Total CK: 56 U/L (ref 49–397)

## 2023-12-16 LAB — GLUCOSE, PLEURAL OR PERITONEAL FLUID: Glucose, Fluid: 30 mg/dL

## 2023-12-16 LAB — LACTATE DEHYDROGENASE, PLEURAL OR PERITONEAL FLUID: LD, Fluid: 1578 U/L — ABNORMAL HIGH (ref 3–23)

## 2023-12-16 LAB — SEDIMENTATION RATE: Sed Rate: 63 mm/h — ABNORMAL HIGH (ref 0–16)

## 2023-12-16 MED ORDER — POLYETHYLENE GLYCOL 3350 17 G PO PACK: 17.0000 g | PACK | Freq: Every day | ORAL | Status: DC | PRN

## 2023-12-16 MED ORDER — SODIUM CHLORIDE 0.9% FLUSH
3.0000 mL | Freq: Two times a day (BID) | INTRAVENOUS | Status: DC
  Administered 2023-12-16 – 2023-12-25 (×14): 3 mL via INTRAVENOUS

## 2023-12-16 MED ORDER — OXYCODONE-ACETAMINOPHEN 5-325 MG PO TABS
1.0000 | ORAL_TABLET | Freq: Four times a day (QID) | ORAL | Status: DC | PRN
  Administered 2023-12-17 – 2023-12-19 (×3): 1 via ORAL
  Filled 2023-12-16 (×3): qty 1

## 2023-12-16 MED ORDER — ALBUTEROL SULFATE (2.5 MG/3ML) 0.083% IN NEBU: 2.5000 mg | INHALATION_SOLUTION | RESPIRATORY_TRACT | Status: DC | PRN

## 2023-12-16 MED ORDER — AMOXICILLIN-POT CLAVULANATE 875-125 MG PO TABS: 1.0000 | ORAL_TABLET | Freq: Two times a day (BID) | ORAL | Status: DC

## 2023-12-16 MED ORDER — SODIUM CHLORIDE 0.9% FLUSH
10.0000 mL | Freq: Three times a day (TID) | INTRAVENOUS | Status: DC
  Administered 2023-12-16 – 2023-12-23 (×21): 10 mL via INTRAPLEURAL

## 2023-12-16 MED ORDER — LISINOPRIL 20 MG PO TABS
30.0000 mg | ORAL_TABLET | Freq: Every day | ORAL | Status: DC
  Administered 2023-12-17 – 2023-12-25 (×8): 30 mg via ORAL
  Filled 2023-12-16 (×8): qty 1

## 2023-12-16 MED ORDER — PNEUMOCOCCAL 20-VAL CONJ VACC 0.5 ML IM SUSY
0.5000 mL | PREFILLED_SYRINGE | INTRAMUSCULAR | Status: DC
  Filled 2023-12-16: qty 0.5

## 2023-12-16 MED ORDER — ALBUTEROL SULFATE HFA 108 (90 BASE) MCG/ACT IN AERS: 2.0000 | INHALATION_SPRAY | RESPIRATORY_TRACT | Status: DC | PRN

## 2023-12-16 MED ORDER — ACETAMINOPHEN 325 MG PO TABS
650.0000 mg | ORAL_TABLET | Freq: Four times a day (QID) | ORAL | Status: DC | PRN
  Administered 2023-12-16 – 2023-12-23 (×6): 650 mg via ORAL
  Filled 2023-12-16 (×7): qty 2

## 2023-12-16 MED ORDER — ACETAMINOPHEN 650 MG RE SUPP: 650.0000 mg | Freq: Four times a day (QID) | RECTAL | Status: DC | PRN

## 2023-12-16 MED ORDER — LABETALOL HCL 5 MG/ML IV SOLN
20.0000 mg | INTRAVENOUS | Status: DC | PRN
  Administered 2023-12-22: 20 mg via INTRAVENOUS
  Filled 2023-12-16: qty 4

## 2023-12-16 NOTE — ED Provider Triage Note (Addendum)
 Emergency Medicine Provider Triage Evaluation Note  Timothy Newton , a 63 y.o. male with a history of MALT lymphoma, lung mass and pleural effusion who was evaluated in triage.  Pt complains of shortness of breath.  Sent in by pulmonologist Dr. Adrien who saw him in the office yesterday and point-of-care lung ultrasound showed 10 to 12 cm simple right pleural effusion.  Was directed here for increased shortness of breath and need for likely thoracentesis.  Denies fevers and chills  Review of Systems  Positive: As above Negative: As above  Physical Exam  BP (!) 160/103   Pulse (!) 110   Temp 97.9 F (36.6 C) (Oral)   Resp 20   Ht 6' 2 (1.88 m)   Wt 108.9 kg   SpO2 90%   BMI 30.81 kg/m  Gen:   Awake, no distress   Resp:  Normal effort  MSK:   Moves extremities without difficulty  Other:    Medical Decision Making  Medically screening exam initiated at 10:32 AM.  Appropriate orders placed.  Fabricio Pudlo was informed that the remainder of the evaluation will be completed by another provider, this initial triage assessment does not replace that evaluation, and the importance of remaining in the ED until their evaluation is complete.  Message received from patient's pulmonologist Dr. Winfred: I would like this patient to be admitted with a chest tube placement. He seems stable, but prior thora didn't resolve the pleural effusion. He will need pulm evaluation for lung collapse pleural effusion and concerning for cancer, likely needs a EBUS and biopsies. I dont want to send him home, bc the fluid needs to drain for several days. If they do the chest tube, please they need to do pathology, and all the pleural fluid studies again as well.      Pamella Ozell LABOR, DO 12/16/23 1035    Pamella Ozell LABOR, DO 12/16/23 1054

## 2023-12-16 NOTE — ED Notes (Signed)
 [place] drainage system replaced. Filled to 2L.

## 2023-12-16 NOTE — Telephone Encounter (Signed)
 Copied from CRM [id number]. Topic: Clinical - Medical Advice >> Dec 16, 2023  8:12 AM [name] wrote: Reason for CRM: Patient calling to inform provider that he is going to the ER.  Callback number: [phone]    Noted.

## 2023-12-16 NOTE — ED Provider Notes (Signed)
 [place] EMERGENCY DEPARTMENT AT [facility] Provider Note   CSN: [id number] Arrival date & time: 12/16/23  1019     Patient presents with: Shortness of Breath  Timothy Newton , a 63 y.o. male with a history of MALT lymphoma, lung mass and pleural effusion who was evaluated in triage.  Pt complains of shortness of breath.  Sent in by pulmonologist Dr. Adrien who saw him in the office yesterday and point-of-care lung ultrasound showed 10 to 12 cm simple right pleural effusion.  Was directed here for increased shortness of breath and need for likely thoracentesis.  Denies fevers and chills    Shortness of Breath      Prior to Admission medications   Medication Sig Start Date End Date Taking? Authorizing Provider  albuterol (VENTOLIN HFA) 108 (90 Base) MCG/ACT inhaler Inhale 2 puffs into the lungs. 12/08/23 12/07/24 Yes [provider]  amoxicillin -clavulanate (AUGMENTIN ) 875-125 MG tablet Take 1 tablet by mouth every 12 (twelve) hours. 12/15/23 01/14/24 Yes Adrien Winfred Berke, MD  ibuprofen (ADVIL) 200 MG tablet Take 400 mg by mouth every 6 (six) hours as needed for moderate pain (pain score 4-6).   Yes [provider]  lisinopril  (ZESTRIL ) 20 MG tablet Take 30 mg by mouth daily. 03/26/21  Yes [provider]  oxyCODONE -acetaminophen  (PERCOCET) 5-325 MG tablet Take 1 tablet by mouth every 6 (six) hours as needed. 11/09/23  Yes Patt Alm Macho, MD    Allergies: Patient has no known allergies.    Review of Systems  Respiratory:  Positive for shortness of breath.     Updated Vital Signs BP (!) 160/103   Pulse (!) 110   Temp 97.9 F (36.6 C) (Oral)   Resp 20   Ht 6' 2 (1.88 m)   Wt 108.9 kg   SpO2 90%   BMI 30.81 kg/m   Physical Exam Vitals and nursing note reviewed.  HENT:     Head: Normocephalic and atraumatic.  Eyes:     Pupils: Pupils are equal, round, and reactive to light.  Cardiovascular:     Rate and Rhythm:  Normal rate and regular rhythm.  Pulmonary:     Effort: Pulmonary effort is normal.     Breath sounds: Examination of the right-middle field reveals decreased breath sounds. Examination of the right-lower field reveals decreased breath sounds. Decreased breath sounds present. No wheezing or rales.  Abdominal:     Palpations: Abdomen is soft.     Tenderness: There is no abdominal tenderness.  Skin:    General: Skin is warm and dry.  Neurological:     Mental Status: He is alert.  Psychiatric:        Mood and Affect: Mood normal.     (all labs ordered are listed, but only abnormal results are displayed) Labs Reviewed  BASIC METABOLIC PANEL WITH GFR - Abnormal; Notable for the following components:      Result Value   Glucose, Bld 110 (*)    Creatinine, Ser 1.28 (*)    All other components within normal limits  BODY FLUID CULTURE W GRAM STAIN  CBC  ANCA PROFILE  RHEUMATOID FACTOR  CYCLIC CITRUL PEPTIDE ANTIBODY, IGG/IGA  CK  SEDIMENTATION RATE  C-REACTIVE PROTEIN  C3 COMPLEMENT  C4 COMPLEMENT  ALDOLASE  ANTI-SCLERODERMA ANTIBODY  FERRITIN  BODY FLUID CELL COUNT WITH DIFFERENTIAL  GLUCOSE, PLEURAL OR PERITONEAL FLUID  LACTATE DEHYDROGENASE, PLEURAL OR PERITONEAL FLUID  PROTEIN, PLEURAL OR PERITONEAL FLUID  TRIGLYCERIDES, BODY FLUIDS  ANTINUCLEAR  ANTIBODIES, IFA  HIV ANTIBODY (ROUTINE TESTING W REFLEX)  CYTOLOGY - NON PAP    EKG: EKG Interpretation Date/Time:  Tuesday December 16 2023 10:39:51 EST Ventricular Rate:  114 PR Interval:  130 QRS Duration:  86 QT Interval:  334 QTC Calculation: 460 R Axis:   74  Text Interpretation: Sinus tachycardia Otherwise normal ECG When compared with ECG of 09-Nov-2023 15:26, PREVIOUS ECG IS PRESENT Confirmed by Pamella Sharper [id number]) on 12/16/2023 12:26:08 PM  Radiology: No results found.   Procedures   Medications Ordered in the ED  oxyCODONE -acetaminophen  (PERCOCET/ROXICET) 5-325 MG per tablet 1 tablet (has no  administration in time range)  amoxicillin -clavulanate (AUGMENTIN ) 875-125 MG per tablet 1 tablet (has no administration in time range)  lisinopril  (ZESTRIL ) tablet 30 mg (has no administration in time range)  albuterol (VENTOLIN HFA) 108 (90 Base) MCG/ACT inhaler 2 puff (has no administration in time range)  sodium chloride  flush (NS) 0.9 % injection 3 mL (has no administration in time range)  acetaminophen  (TYLENOL ) tablet 650 mg (has no administration in time range)    Or  acetaminophen  (TYLENOL ) suppository 650 mg (has no administration in time range)  polyethylene glycol (MIRALAX / GLYCOLAX) packet 17 g (has no administration in time range)  sodium chloride  flush (NS) 0.9 % injection 10 mL (has no administration in time range)    Clinical Course as of 12/16/23 1307  Tue Dec 16, 2023  1149 Discussed w pulmonology who will perform thoracentesis in ED [MP]  1307 Discussed w admitting hospitalist who accepts pt for admission [MP]    Clinical Course User Index [MP] Pamella Sharper LABOR, DO                                 Medical Decision Making 63 year old male with history as above presenting under direction of his pulmonologist for recurrent right-sided pleural effusion.  Small lymphoma and lung mass.  Right-sided pleural effusion did not resolve after recent thoracentesis.  Plan to reach out to pulmonology here for thoracentesis, further evaluation and admit to medicine.  Patient in no respiratory distress stable on room air during my assessment  Amount and/or Complexity of Data Reviewed Labs: ordered. Radiology: ordered.  Risk Decision regarding hospitalization.        Final diagnoses:  Pleural effusion  MALT lymphoma [facility])    ED Discharge Orders     None          Pamella Sharper LABOR, DO 12/16/23 1307

## 2023-12-16 NOTE — Procedures (Signed)
 Insertion of Chest Tube Procedure Note  Gael Londo  [id number]  1960/04/23  Date:12/16/23  Time:2:55 PM    Provider Performing: Toribio JAYSON Sharps   Procedure: Pleural Catheter Insertion w/ Imaging Guidance ([id number])  Indication(s) Effusion  Consent Risks of the procedure as well as the alternatives and risks of each were explained to the patient and/or caregiver.  Consent for the procedure was obtained and is signed in the bedside chart  Anesthesia Topical only with 1% lidocaine     Time Out Verified patient identification, verified procedure, site/side was marked, verified correct patient position, special equipment/implants available, medications/allergies/relevant history reviewed, required imaging and test results available.   Sterile Technique Maximal sterile technique including full sterile barrier drape, hand hygiene, sterile gown, sterile gloves, mask, hair covering, sterile ultrasound probe cover (if used).   Procedure Description Ultrasound used to identify appropriate pleural anatomy for placement and overlying skin marked. Area of placement cleaned and draped in sterile fashion.  A 14 French pigtail pleural catheter was placed into the right pleural space using Seldinger technique. Appropriate return of fluid was obtained.  The tube was connected to atrium and placed on -20 cm H2O wall suction.   Complications/Tolerance None; patient tolerated the procedure well. Chest X-ray is ordered to verify placement.   EBL Minimal  Specimen(s) fluid

## 2023-12-16 NOTE — ED Notes (Signed)
 Additional 2L in 2nd drainage system, Clamped per verbal MD order

## 2023-12-16 NOTE — H&P (Signed)
 History and Physical   Timothy Newton FMW:[id number] DOB: [birth year] DOA: 12/16/2023  PCP: Dorcus Lamar POUR, MD   Patient coming from: Home/pulmonologist office  Chief Complaint: Shortness of breath/pleural effusion  HPI: Timothy Newton is a 63 y.o. 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  Patient has been undergoing workup for lung mass by pulmonology.  He was seen in the ED on 10/5 for shortness of breath and underwent 1 L thoracentesis at that time with elevated white cells and positive mesothelioma cells.  Pulmonology recommended continue with planned outpatient follow-up.  Patient followed up today with pulmonology and is short of breath again with recurrent pleural effusion.  Recommended to come to the ED for chest tube placement, further workup, EBUS and biopsy.  Patient denies chest pain, abdominal pain, constipation, diarrhea, nausea, vomiting.  ED Course: Vital signs in ED notable for heart rate in the 110s.  Blood pressure in the 120s-160s systolic.  Lab workup included BMP with creatinine at 1.28 near baseline 1.1, glucose 110.  CBC within normal limits.  Chest x-ray pending. 10/5 CT PE study showed no evidence of PE but did show dense opacity right upper lobe with questionable cystic bronchiectasis, consolidation right middle lobe with consolidation/collapse of the right lower lobe consistent with multifocal pneumonia.  Concern for pleural effusion.  Also noted was 12 mm left upper lobe nodule and 9 mm lingular nodule.  No initial intervention in the ED.  Pulmonology consulted and additional labs ordered including aldolase, ANA, ANCA, antiscleroderma, CRP, C3, C4, CK, RF, CCP, ferritin, ESR.  Review of Systems: As per HPI otherwise all other systems reviewed and are negative.  Past Medical History:  Diagnosis Date   Anemia    Bleeding gastric ulcer    Blood transfusion    H. 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   s/p 14 days Nexium, clarithromycin and amoxicillin     Past Surgical History:  Procedure Laterality Date   HYDROCELE EXCISION Bilateral 12/27/2022   Procedure: BILATERAL HYDROCELECTOMY ADULT;  Surgeon: Alvaro Ricardo KATHEE Mickey., MD;  Location: [facility];  Service: Urology;  Laterality: Bilateral;   ORCHIOPEXY Bilateral 12/27/2022   Procedure: BILATERAL ORCHIOPEXY ADULT;  Surgeon: Alvaro Ricardo KATHEE Mickey., MD;  Location: [facility];  Service: Urology;  Laterality: Bilateral;  120 MINS FOR CASE   UPPER GASTROINTESTINAL ENDOSCOPY  2006, 2013    Social History  reports that he quit smoking about 22 years ago. His smoking use included cigarettes. He started smoking about 27 years ago. He has a 5 pack-year smoking history. He has quit using smokeless tobacco. He reports that he does not drink alcohol and does not use drugs.  No Known Allergies  Family History  Problem Relation Age of Onset   Colon cancer Neg Hx    Diabetes Neg Hx    Heart disease Neg Hx   Reviewed on admission  Prior to Admission medications   Medication Sig Start Date End Date Taking? Authorizing Provider  albuterol (VENTOLIN HFA) 108 (90 Base) MCG/ACT inhaler Inhale 2 puffs into the lungs. 12/08/23 12/07/24 Yes [provider]  amoxicillin -clavulanate (AUGMENTIN ) 875-125 MG tablet Take 1 tablet by mouth every 12 (twelve) hours. 12/15/23 01/14/24 Yes Adrien Winfred Berke, MD  ibuprofen (ADVIL) 200 MG tablet Take 400 mg by mouth every 6 (six) hours as needed for moderate pain (pain score 4-6).  Yes [provider]  lisinopril  (ZESTRIL ) 20 MG tablet Take 30 mg by mouth daily. 03/26/21  Yes [provider]  oxyCODONE -acetaminophen  (PERCOCET) 5-325 MG tablet Take 1 tablet by mouth every 6 (six) hours as needed. 11/09/23  Yes Patt Alm Macho, MD    Physical Exam: Vitals:   12/16/23 1022 12/16/23  1031  BP: (!) 160/103   Pulse: (!) 110   Resp: 20   Temp: 97.9 F (36.6 C)   TempSrc: Oral   SpO2: 90%   Weight:  108.9 kg  Height:  6' 2 (1.88 m)    Physical Exam Constitutional:      General: He is not in acute distress.    Appearance: Normal appearance.  HENT:     Head: Normocephalic and atraumatic.     Mouth/Throat:     Mouth: Mucous membranes are moist.     Pharynx: Oropharynx is clear.  Eyes:     Extraocular Movements: Extraocular movements intact.     Pupils: Pupils are equal, round, and reactive to light.  Cardiovascular:     Rate and Rhythm: Normal rate and regular rhythm.     Pulses: Normal pulses.     Heart sounds: Normal heart sounds.  Pulmonary:     Effort: Pulmonary effort is normal. No respiratory distress.     Breath sounds: Examination of the right-middle field reveals decreased breath sounds. Examination of the right-lower field reveals decreased breath sounds. Decreased breath sounds present.     Comments: Chest tube in place on right.  2 L serosanguineous drainage so far Abdominal:     General: Bowel sounds are normal. There is no distension.     Palpations: Abdomen is soft.     Tenderness: There is no abdominal tenderness.  Musculoskeletal:        General: No swelling or deformity.  Skin:    General: Skin is warm and dry.  Neurological:     General: No focal deficit present.     Mental Status: Mental status is at baseline.    Labs on Admission: I have personally reviewed following labs and imaging studies  CBC: Recent Labs  Lab 12/16/23 1047  WBC 8.1  HGB 14.3  HCT 45.9  MCV 93.9  PLT 245    Basic Metabolic Panel: Recent Labs  Lab 12/16/23 1047  NA 139  K 4.3  CL 106  CO2 25  GLUCOSE 110*  BUN 15  CREATININE 1.28*  CALCIUM 10.1    GFR: Estimated Creatinine Clearance: 78.6 mL/min (A) (by C-G formula based on SCr of 1.28 mg/dL (H)).  Liver Function Tests: No results for input(s): AST, ALT, ALKPHOS, BILITOT,  PROT, ALBUMIN in the last 168 hours.  Urine analysis:    Component Value Date/Time   LABSPEC 1.015 02/04/2007 1312   PHURINE 8.5 (H) 02/04/2007 1312   GLUCOSEU NEGATIVE 02/04/2007 1312   HGBUR NEGATIVE 02/04/2007 1312   BILIRUBINUR negative 07/08/2022 0942   KETONESUR negative 07/08/2022 0942   KETONESUR NEGATIVE 02/04/2007 1312   PROTEINUR =100 (A) 07/08/2022 0942   PROTEINUR 100 (A) 02/04/2007 1312   UROBILINOGEN 1.0 07/08/2022 0942   UROBILINOGEN 4.0 (H) 02/04/2007 1312   NITRITE Positive (A) 07/08/2022 0942   NITRITE NEGATIVE 02/04/2007 1312   LEUKOCYTESUR Small (1+) (A) 07/08/2022 0942    Radiological Exams on Admission: No results found.  EKG: Independently reviewed.  Sinus tachycardia 114 bpm.  Nonspecific T wave changes.  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  > Has undergone multiple thoracentesis with fluid analysis initially appearing nonmalignant, but with suspicion still high. > Sent by pulmonologist due to recurrent shortness of breath/reaccumulation of pleural effusion.  Plan for chest tube which has been placed in the ED and for EBUS with biopsy. - Monitor on telemetry/continuous pulse ox - Appreciate pulmonology recommendations and assistance - Follow-up labs including: Aldolase, ANA, ANCA, antiscleroderma, RF, CCP, CRP, ESR, C3, C4, CK. - Supportive care  Hypertension - Continue home lisinopril   History of PUD History of MALT lymphoma - Noted  DVT prophylaxis: SCDs, pending procedures Code Status:   Full Family Communication:  Updated at bedside Disposition Plan:   Patient is from:  Home  Anticipated DC to:  Home  Anticipated DC date:  1 to 3 days  Anticipated DC barriers: None  Consults called:  Pulmonology Admission status:  Observation,  telemetry  Severity of Illness: The appropriate patient status for this patient is OBSERVATION. Observation status is judged to be reasonable and necessary in order to provide the required intensity of service to ensure the patient's safety. The patient's presenting symptoms, physical exam findings, and initial radiographic and laboratory data in the context of their medical condition is felt to place them at decreased risk for further clinical deterioration. Furthermore, it is anticipated that the patient will be medically stable for discharge from the hospital within 2 midnights of admission.    Marsa KATHEE Scurry MD [facility]  How to contact the [facility] Attending or Consulting provider 7A - 7P or covering provider during after hours 7P -7A, for this patient?   Check the care team in [facility] and look for a) attending/consulting [facility] provider listed and b) the [facility] team listed Log into [email or web address] and use [place]'s universal password to access. If you do not have the password, please contact the hospital operator. Locate the [facility] provider you are looking for under [facility] and page to a number that you can be directly reached. If you still have difficulty reaching the provider, please page the [facility] (Director on Call) for the Hospitalists listed on amion for assistance.  12/16/2023, 12:52 PM

## 2023-12-16 NOTE — Consult Note (Signed)
 NAME:  Timothy Newton, MRN:  [id number], DOB:  01/07/1961, LOS: 0 ADMISSION DATE:  12/16/2023, CONSULTATION DATE:  12/16/23 REFERRING MD:  EDP, CHIEF COMPLAINT:  SOB   History of Present Illness:  63 year old man w/ hx of MALT who was being followed by [facility] for a lung mass and effusion that acutely worsened and required thora in our ER back in early October, path showing inflammation.  He has since followed up with [facility] who ordered a PET/CT then referred the patient back to us  for additional tissue sampling.  He was seen in our pulmonary clinic and noted to be very dyspneic with even minimal exertion so instructed to come to ER today.    Pertinent  Medical History   Past Medical History:  Diagnosis Date   Anemia    Bleeding gastric ulcer    Blood transfusion    H. 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 rate 20, height 6' 2 (1.88 m), weight 108.9 kg, SpO2 90%.       No intake or output data in the 24 hours ending 12/16/23 1257 Filed Weights   12/16/23 1031  Weight: 108.9 kg    Examination: General: no distress HENT: MMM Lungs: absent breath sounds left, large effusion, wheezing on exertion Cardiovascular: regular, ext warm Abdomen: soft, +BS Extremities: no edema Neuro: moves everything Psych: normal affect, moves everything.  Resolved problem list   Assessment and Plan   + PET, large effusion, mass like cavitary lesion on CT- a little bit of an odd presentation; ipsilateral to a cavitary lesion from 12 years ago.  PET scan  read from [facility] reviewed: does seem c/w a lymphoma type presentation although he has no B symptoms which is odd.    - Appreciate [facility] admit, pigtail to right chest - Redo cytology: asked cytology dept to send for flow if able - Once space looks drained will get CT scan C/A/P to look for best site of biopsy if fluid neg - If ongoing high output talc vs. pleurX vs both - If incomplete expansion (entrapment as possible pleural involvement) pleurX only vs intermittent thora - If loculation would need to weigh risks/benefits of TPA vs. Considering pleuroscopy - Tough case  Labs   CBC: Recent Labs  Lab 12/16/23 1047  WBC 8.1  HGB 14.3  HCT 45.9  MCV 93.9  PLT 245    Basic Metabolic Panel: Recent Labs  Lab 12/16/23 1047  NA 139  K 4.3  CL 106  CO2 25  GLUCOSE 110*  BUN 15  CREATININE 1.28*  CALCIUM 10.1   GFR: Estimated Creatinine Clearance: 78.6 mL/min (A) (by C-G formula based on SCr of 1.28 mg/dL (H)). Recent Labs  Lab 12/16/23 1047  WBC 8.1    Liver Function Tests: No results for input(s): AST, ALT, ALKPHOS, BILITOT, PROT, ALBUMIN in the last 168 hours. No results for input(s): LIPASE, AMYLASE in the last 168 hours. No results for input(s): AMMONIA in the last 168 hours.  ABG    Component Value Date/Time   HCO3  31.7 (H) 02/04/2007 1335   TCO2 33 02/04/2007 1335     Coagulation Profile: No results for input(s): INR, PROTIME in the last 168 hours.  Cardiac Enzymes: No results for input(s): CKTOTAL, CKMB, CKMBINDEX, TROPONINI in the last 168 hours.  HbA1C: Hgb A1c MFr Bld  Date/Time Value Ref Range Status  04/08/2019 03:53 PM 5.8 (H) 4.8 - 5.6 % Final    Comment:             Prediabetes: 5.7 - 6.4          Diabetes: >6.4          Glycemic control for adults with diabetes: <7.0   09/08/2018 12:31 PM 5.6 4.8 - 5.6 % Final    Comment:             Prediabetes: 5.7 - 6.4          Diabetes: >6.4          Glycemic control for  adults with diabetes: <7.0     CBG: No results for input(s): GLUCAP in the last 168 hours.  Review of Systems:    Positive Symptoms in bold:  Constitutional fevers, chills, weight loss, fatigue, anorexia, malaise  Eyes decreased vision, double vision, eye irritation  Ears, Nose, Mouth, Throat sore throat, trouble swallowing, sinus congestion  Cardiovascular chest pain, paroxysmal nocturnal dyspnea, lower ext edema, palpitations   Respiratory SOB, cough, DOE, hemoptysis, wheezing  Gastrointestinal nausea, vomiting, diarrhea  Genitourinary burning with urination, trouble urinating  Musculoskeletal joint aches, joint swelling, back pain  Integumentary  rashes, skin lesions  Neurological focal weakness, focal numbness, trouble speaking, headaches  Psychiatric depression, anxiety, confusion  Endocrine polyuria, polydipsia, cold intolerance, heat intolerance  Hematologic abnormal bruising, abnormal bleeding, unexplained nose bleeds  Allergic/Immunologic recurrent infections, hives, swollen lymph nodes     Past Medical History:  He,  has a past medical history of Anemia, Bleeding gastric ulcer, Blood transfusion, H. pylori infection (02/05/2004), Hypertension, and MALT lymphoma (HCC) (02/05/2004).   Surgical History:   Past Surgical History:  Procedure Laterality Date   HYDROCELE EXCISION Bilateral 12/27/2022   Procedure: BILATERAL HYDROCELECTOMY ADULT;  Surgeon: Alvaro Ricardo KATHEE Mickey., MD;  Location: [facility];  Service: Urology;  Laterality: Bilateral;   ORCHIOPEXY Bilateral 12/27/2022   Procedure: BILATERAL ORCHIOPEXY ADULT;  Surgeon: Alvaro Ricardo KATHEE Mickey., MD;  Location: [facility];  Service: Urology;  Laterality: Bilateral;  120 MINS FOR CASE   UPPER GASTROINTESTINAL ENDOSCOPY  2006, 2013     Social History:   reports that he quit smoking about 22 years ago. His smoking use included cigarettes. He started smoking about 27 years ago. He has a 5 pack-year smoking history. He has  quit using smokeless tobacco. He reports that he does not drink alcohol and does not use drugs.   Family History:  His family history is negative for Colon cancer, Diabetes, and Heart disease.   Allergies No Known Allergies   Home Medications  Prior to Admission medications   Medication Sig Start Date End Date Taking? Authorizing Provider  albuterol (VENTOLIN HFA) 108 (90 Base) MCG/ACT inhaler Inhale 2 puffs into the lungs. 12/08/23 12/07/24 Yes [provider]  amoxicillin -clavulanate (AUGMENTIN ) 875-125 MG tablet Take 1 tablet by mouth every 12 (twelve) hours. 12/15/23 01/14/24 Yes Adrien Winfred Berke, MD  ibuprofen (ADVIL) 200 MG tablet Take 400 mg by mouth every 6 (six) hours as needed for moderate pain (pain score 4-6).   Yes [provider]  lisinopril  (ZESTRIL ) 20  MG tablet Take 30 mg by mouth daily. 03/26/21  Yes [provider]  oxyCODONE -acetaminophen  (PERCOCET) 5-325 MG tablet Take 1 tablet by mouth every 6 (six) hours as needed. 11/09/23  Yes Patt Alm Macho, MD     Critical care time: N/A

## 2023-12-16 NOTE — ED Triage Notes (Signed)
 Here by POV with family from home for sob. Sent to ED by PCCM Dr. Adrien for a thoracentesis. Has been dealing with sob for ~ 1 week. Alert, NAD, calm, interactive, resps e/u, speaking in clear complete sentences. Steady gait.

## 2023-12-17 ENCOUNTER — Observation Stay (HOSPITAL_COMMUNITY)

## 2023-12-17 DIAGNOSIS — J984 Other disorders of lung: Secondary | ICD-10-CM | POA: Diagnosis not present

## 2023-12-17 DIAGNOSIS — Z8572 Personal history of non-Hodgkin lymphomas: Secondary | ICD-10-CM | POA: Diagnosis not present

## 2023-12-17 DIAGNOSIS — J910 Malignant pleural effusion: Secondary | ICD-10-CM | POA: Diagnosis present

## 2023-12-17 DIAGNOSIS — R918 Other nonspecific abnormal finding of lung field: Secondary | ICD-10-CM | POA: Diagnosis present

## 2023-12-17 DIAGNOSIS — C884 Extranodal marginal zone b-cell lymphoma of mucosa-associated lymphoid tissue (malt-lymphoma) not having achieved remission: Secondary | ICD-10-CM | POA: Diagnosis present

## 2023-12-17 DIAGNOSIS — R590 Localized enlarged lymph nodes: Secondary | ICD-10-CM | POA: Diagnosis present

## 2023-12-17 DIAGNOSIS — J90 Pleural effusion, not elsewhere classified: Secondary | ICD-10-CM | POA: Diagnosis not present

## 2023-12-17 DIAGNOSIS — J948 Other specified pleural conditions: Secondary | ICD-10-CM | POA: Diagnosis not present

## 2023-12-17 DIAGNOSIS — E669 Obesity, unspecified: Secondary | ICD-10-CM | POA: Diagnosis present

## 2023-12-17 DIAGNOSIS — Z6831 Body mass index (BMI) 31.0-31.9, adult: Secondary | ICD-10-CM | POA: Diagnosis not present

## 2023-12-17 DIAGNOSIS — K5730 Diverticulosis of large intestine without perforation or abscess without bleeding: Secondary | ICD-10-CM | POA: Diagnosis present

## 2023-12-17 DIAGNOSIS — Z792 Long term (current) use of antibiotics: Secondary | ICD-10-CM | POA: Diagnosis not present

## 2023-12-17 DIAGNOSIS — T451X5A Adverse effect of antineoplastic and immunosuppressive drugs, initial encounter: Secondary | ICD-10-CM | POA: Diagnosis not present

## 2023-12-17 DIAGNOSIS — Z4682 Encounter for fitting and adjustment of non-vascular catheter: Secondary | ICD-10-CM | POA: Diagnosis not present

## 2023-12-17 DIAGNOSIS — R911 Solitary pulmonary nodule: Secondary | ICD-10-CM | POA: Diagnosis present

## 2023-12-17 DIAGNOSIS — I10 Essential (primary) hypertension: Secondary | ICD-10-CM | POA: Diagnosis present

## 2023-12-17 DIAGNOSIS — N289 Disorder of kidney and ureter, unspecified: Secondary | ICD-10-CM | POA: Diagnosis present

## 2023-12-17 DIAGNOSIS — Z87891 Personal history of nicotine dependence: Secondary | ICD-10-CM | POA: Diagnosis not present

## 2023-12-17 DIAGNOSIS — R509 Fever, unspecified: Secondary | ICD-10-CM | POA: Diagnosis present

## 2023-12-17 DIAGNOSIS — D630 Anemia in neoplastic disease: Secondary | ICD-10-CM | POA: Diagnosis present

## 2023-12-17 DIAGNOSIS — Z79899 Other long term (current) drug therapy: Secondary | ICD-10-CM | POA: Diagnosis not present

## 2023-12-17 LAB — CBC
HCT: 41 % (ref 39.0–52.0)
Hemoglobin: 13.2 g/dL (ref 13.0–17.0)
MCH: 29.1 pg (ref 26.0–34.0)
MCHC: 32.2 g/dL (ref 30.0–36.0)
MCV: 90.3 fL (ref 80.0–100.0)
Platelets: 224 K/uL (ref 150–400)
RBC: 4.54 MIL/uL (ref 4.22–5.81)
RDW: 12.8 % (ref 11.5–15.5)
WBC: 13.4 K/uL — ABNORMAL HIGH (ref 4.0–10.5)
nRBC: 0 % (ref 0.0–0.2)

## 2023-12-17 LAB — COMPREHENSIVE METABOLIC PANEL WITH GFR
ALT: 13 U/L (ref 0–44)
AST: 17 U/L (ref 15–41)
Albumin: 2.7 g/dL — ABNORMAL LOW (ref 3.5–5.0)
Alkaline Phosphatase: 49 U/L (ref 38–126)
Anion gap: 8 (ref 5–15)
BUN: 14 mg/dL (ref 8–23)
CO2: 25 mmol/L (ref 22–32)
Calcium: 9.4 mg/dL (ref 8.9–10.3)
Chloride: 104 mmol/L (ref 98–111)
Creatinine, Ser: 1.27 mg/dL — ABNORMAL HIGH (ref 0.61–1.24)
GFR, Estimated: >60 mL/min (ref >60)
Glucose, Bld: 125 mg/dL — ABNORMAL HIGH (ref 70–99)
Potassium: 3.9 mmol/L (ref 3.5–5.1)
Sodium: 137 mmol/L (ref 135–145)
Total Bilirubin: 1.2 mg/dL (ref 0.0–1.2)
Total Protein: 8.2 g/dL — ABNORMAL HIGH (ref 6.5–8.1)

## 2023-12-17 LAB — ANTINUCLEAR ANTIBODIES, IFA: ANA Ab, IFA: NEGATIVE

## 2023-12-17 LAB — BODY FLUID CELL COUNT WITH DIFFERENTIAL
Eos, Fluid: 0 %
Lymphs, Fluid: 55 %
Monocyte-Macrophage-Serous Fluid: 41 % — ABNORMAL LOW (ref 50–90)
Neutrophil Count, Fluid: 4 % (ref 0–25)
Total Nucleated Cell Count, Fluid: >22847 uL — ABNORMAL HIGH (ref 0–1000)

## 2023-12-17 LAB — ANCA PROFILE
Anti-MPO Antibodies: <0.2 U (ref 0.0–0.9)
Anti-PR3 Antibodies: <0.2 U (ref 0.0–0.9)
Atypical P-ANCA titer: <1:20 {titer}
C-ANCA: <1:20 {titer}
P-ANCA: <1:20 {titer}

## 2023-12-17 LAB — TRIGLYCERIDES, BODY FLUIDS: Triglycerides, Fluid: 14 mg/dL

## 2023-12-17 LAB — CYCLIC CITRUL PEPTIDE ANTIBODY, IGG/IGA: CCP Antibodies IgG/IgA: 9 U (ref 0–19)

## 2023-12-17 LAB — ANTI-SCLERODERMA ANTIBODY: Scleroderma (Scl-70) (ENA) Antibody, IgG: <0.2 AI (ref 0.0–0.9)

## 2023-12-17 LAB — ALDOLASE: Aldolase: 64.2 U/L — ABNORMAL HIGH (ref 3.3–10.3)

## 2023-12-17 MED ORDER — TRAZODONE HCL 50 MG PO TABS
25.0000 mg | ORAL_TABLET | Freq: Every evening | ORAL | Status: DC | PRN
  Administered 2023-12-17 – 2023-12-24 (×6): 25 mg via ORAL
  Filled 2023-12-17 (×6): qty 1

## 2023-12-17 MED ORDER — IOHEXOL 350 MG/ML SOLN
75.0000 mL | Freq: Once | INTRAVENOUS | Status: AC | PRN
  Administered 2023-12-17: 75 mL via INTRAVENOUS

## 2023-12-17 NOTE — Progress Notes (Signed)
 PROGRESS NOTE    Jurgen Groeneveld  FMW:[id number] DOB: 08-16-1960 DOA: 12/16/2023 PCP: Dorcus Lamar POUR, MD    Brief Narrative:  Timothy Newton is a 63 y.o. 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   Patient has been undergoing workup for lung mass by pulmonology.  He was seen in the ED on 10/5 for shortness of breath and underwent 1 L thoracentesis at that time with elevated white cells and positive mesothelioma cells.  Pulmonology recommended continue with planned outpatient follow-up.   Patient followed up today with pulmonology and is short of breath again with recurrent pleural effusion.  Recommended to come to the ED for chest tube placement, further workup, EBUS and biopsy.  Assessment and Plan: Lung masses Recurrent pleural effusion > Concern for lung nodules/masses and recurrent pleural effusions.  > Has undergone multiple thoracentesis with fluid analysis initially appearing nonmalignant, but with suspicion still high. > Sent by pulmonologist due to recurrent shortness of breath/reaccumulation of pleural effusion.  Plan for chest tube which has been placed in the ED and for EBUS with biopsy. - Monitor on telemetry/continuous pulse ox - Appreciate pulmonology recommendations and assistance - Follow-up labs including: Aldolase, ANA, ANCA, antiscleroderma, RF, CCP, CRP, ESR, C3, C4, CK. - Supportive care   Fever -defer abx to PCCM-- culture with NGTD  Hypertension - Continue home lisinopril    History of PUD History of MALT lymphoma - Noted   DVT prophylaxis: SCDs Start: 12/16/23 1304    Code Status: Full Code   Disposition Plan:  Level of care: Telemetry Status is: Inpatient     Consultants:  PCCM   Subjective: Overall feeling well  Objective: Vitals:   12/16/23 2025 12/17/23 0054 12/17/23 0116 12/17/23 0319  BP: (!) 152/87 (!) 141/74  116/79  Pulse: (!) 109 85  93  Resp:  18   16  Temp:  99.6 F (37.6 C) (!) 101 F (38.3 C) 98.7 F (37.1 C)  TempSrc:  Oral  Oral  SpO2:  95%  100%  Weight:      Height:        Intake/Output Summary (Last 24 hours) at 12/17/2023 1139 Last data filed at 12/17/2023 0600 Gross per 24 hour  Intake --  Output 800 ml  Net -800 ml   Filed Weights   12/16/23 1031  Weight: 108.9 kg    Examination:   General: Appearance:    Obese male in no acute distress     Lungs:     Chest tube in place, respirations unlabored  Heart:    Normal heart rate.     MS:   All extremities are intact.    Neurologic:   Awake, alert, oriented x 3. No apparent focal neurological           defect.        Data Reviewed: I have personally reviewed following labs and imaging studies  CBC: Recent Labs  Lab 12/16/23 1047 12/17/23 0207  WBC 8.1 13.4*  HGB 14.3 13.2  HCT 45.9 41.0  MCV 93.9 90.3  PLT 245 224   Basic Metabolic Panel: Recent Labs  Lab 12/16/23 1047 12/17/23 0207  NA 139 137  K 4.3 3.9  CL 106 104  CO2 25 25  GLUCOSE 110* 125*  BUN 15 14  CREATININE 1.28* 1.27*  CALCIUM 10.1 9.4   GFR: Estimated Creatinine Clearance: 79.2 mL/min (A) (by C-G formula based on SCr of 1.27 mg/dL (H)). Liver Function  Tests: Recent Labs  Lab 12/17/23 0207  AST 17  ALT 13  ALKPHOS 49  BILITOT 1.2  PROT 8.2*  ALBUMIN 2.7*   No results for input(s): LIPASE, AMYLASE in the last 168 hours. No results for input(s): AMMONIA in the last 168 hours. Coagulation Profile: No results for input(s): INR, PROTIME in the last 168 hours. Cardiac Enzymes: Recent Labs  Lab 12/16/23 1304  CKTOTAL 56   BNP (last 3 results) No results for input(s): PROBNP in the last 8760 hours. HbA1C: No results for input(s): HGBA1C in the last 72 hours. CBG: No results for input(s): GLUCAP in the last 168 hours. Lipid Profile: No results for input(s): CHOL, HDL, LDLCALC, TRIG, CHOLHDL, LDLDIRECT in the last 72 hours. Thyroid  Function Tests: No results for input(s): TSH, T4TOTAL, FREET4, T3FREE, THYROIDAB in the last 72 hours. Anemia Panel: Recent Labs    12/16/23 1304  FERRITIN 37   Sepsis Labs: No results for input(s): PROCALCITON, LATICACIDVEN in the last 168 hours.  Recent Results (from the past 240 hours)  Body fluid culture w Gram Stain     Status: None (Preliminary result)   Collection Time: 12/16/23 12:58 PM   Specimen: Path fluid; Pleural Fluid  Result Value Ref Range Status   Specimen Description FLUID  Final   Special Requests NONE  Final   Gram Stain   Final    FEW WBC PRESENT, PREDOMINANTLY MONONUCLEAR NO ORGANISMS SEEN    Culture   Final    NO GROWTH < 24 HOURS Performed at [facility], [address] [place], [place] [address]    Report Status PENDING  Incomplete         Radiology Studies: DG Chest Port 1 View Result Date: 12/17/2023 EXAM: 1 VIEW(S) XRAY OF THE CHEST 12/17/2023 06:52:00 AM COMPARISON: AP radiograph of the chest dated 12/16/2023. CLINICAL HISTORY: [id number] Chest tube in place [id number] Chest tube in place FINDINGS: LINES, TUBES AND DEVICES: A pigtail catheter is again seen projecting over the right lung base. LUNGS AND PLEURA: Interval improved aeration of the right lung, particularly the right mid lung zone. A right-sided pleural effusion has likely decreased in the interim. The left lung appears clear. No focal pulmonary opacity. No pulmonary edema. No pneumothorax. HEART AND MEDIASTINUM: No acute abnormality of the cardiac and mediastinal silhouettes. BONES AND SOFT TISSUES: No acute osseous abnormality. IMPRESSION: 1. Interval improved aeration of the right lung, particularly the right mid lung zone. 2. Likely decreased right-sided pleural effusion. 3. Pigtail catheter in place over the right lung base. Electronically signed by: Evalene Coho MD 12/17/2023 06:57 AM EST RP Workstation: [id number]   DG Chest 2 View Result Date: 12/16/2023 EXAM:  2 VIEW(S) XRAY OF THE CHEST 12/16/2023 01:55:00 PM COMPARISON: 11/09/2023 CLINICAL HISTORY: shortness of breath FINDINGS: LUNGS AND PLEURA: Complete opacification of right hemithorax, significantly worsened since prior study. Persistent masslike airspace opacity in the left suprahilar region. No left pleural effusion. No pneumothorax. HEART AND MEDIASTINUM: No acute abnormality of the cardiac and mediastinal silhouettes. BONES AND SOFT TISSUES: No acute osseous abnormality. IMPRESSION: 1. Complete opacification of the right hemithorax, significantly worsened since the prior study, probably representing a combination of large effusion and compressive atelectasis versus airspace disease. 2. Similarly appearing masslike opacity in the left suprahilar region. Electronically signed by: Rogelia Myers MD 12/16/2023 02:18 PM EST RP Workstation: [id number]   DG Chest Port 1 View Result Date: 12/16/2023 EXAM: 1 VIEW(S) XRAY OF THE CHEST 12/16/2023 01:16:18 PM COMPARISON: 11/09/2023  CLINICAL HISTORY: Chest tube in place. FINDINGS: LINES, TUBES AND DEVICES: Right chest tube with tip terminating along the right lung base. LUNGS AND PLEURA: Persistent opacification of right hemithorax, despite the chest tube. Known left perihilar mass. No pulmonary edema. No pneumothorax. HEART AND MEDIASTINUM: Atherosclerotic calcifications. No acute abnormality of the cardiac and mediastinal silhouettes. BONES AND SOFT TISSUES: No acute osseous abnormality. IMPRESSION: 1. Persistent opacification of the right hemithorax despite the chest tube. 2. Persistent left perihilar density Electronically signed by: Maude Stammer MD 12/16/2023 01:43 PM EST RP Workstation: [id number]        Scheduled Meds:  lisinopril   30 mg Oral Daily   pneumococcal 20-valent conjugate vaccine  0.5 mL Intramuscular Tomorrow-1000   sodium chloride  flush  10 mL Intrapleural Q8H   sodium chloride  flush  3 mL Intravenous Q12H   Continuous Infusions:    LOS: 0 days    Time spent: 45 minutes spent on chart review, discussion with nursing staff, consultants, updating family and interview/physical exam; more than 50% of that time was spent in counseling and/or coordination of care.    [name], DO [facility] Available via Epic secure chat 7am-7pm After these hours, please refer to coverage provider listed on [email or web address] 12/17/2023, 11:39 AM

## 2023-12-17 NOTE — Progress Notes (Signed)
 12/17/2023  Seen in f/u for effusion.  S: No events, about 4800 out of chest tube since insertion.  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 Pleural lytics Ok for pigtail to be waterseal   Rolan Sharps MD Pulmonary Critical Care Medicine Securechat if during day (7a-7p) [phone] if after hours (7p-7a)

## 2023-12-17 NOTE — Plan of Care (Signed)

## 2023-12-18 ENCOUNTER — Inpatient Hospital Stay (HOSPITAL_COMMUNITY)

## 2023-12-18 ENCOUNTER — Encounter (HOSPITAL_COMMUNITY): Payer: Self-pay | Admitting: Internal Medicine

## 2023-12-18 ENCOUNTER — Encounter (HOSPITAL_COMMUNITY)
Admission: EM | Disposition: A | Discharge status: Home / Self Care | Payer: Self-pay | Source: Home / Self Care | Attending: Internal Medicine

## 2023-12-18 DIAGNOSIS — Z87891 Personal history of nicotine dependence: Secondary | ICD-10-CM

## 2023-12-18 DIAGNOSIS — R918 Other nonspecific abnormal finding of lung field: Secondary | ICD-10-CM

## 2023-12-18 DIAGNOSIS — I10 Essential (primary) hypertension: Secondary | ICD-10-CM | POA: Diagnosis not present

## 2023-12-18 DIAGNOSIS — J90 Pleural effusion, not elsewhere classified: Secondary | ICD-10-CM | POA: Diagnosis not present

## 2023-12-18 LAB — CBC
HCT: 40.7 % (ref 39.0–52.0)
Hemoglobin: 13.1 g/dL (ref 13.0–17.0)
MCH: 29.1 pg (ref 26.0–34.0)
MCHC: 32.2 g/dL (ref 30.0–36.0)
MCV: 90.4 fL (ref 80.0–100.0)
Platelets: 227 K/uL (ref 150–400)
RBC: 4.5 MIL/uL (ref 4.22–5.81)
RDW: 12.6 % (ref 11.5–15.5)
WBC: 9.7 K/uL (ref 4.0–10.5)
nRBC: 0 % (ref 0.0–0.2)

## 2023-12-18 LAB — BASIC METABOLIC PANEL WITH GFR
Anion gap: 11 (ref 5–15)
BUN: 15 mg/dL (ref 8–23)
CO2: 23 mmol/L (ref 22–32)
Calcium: 9.8 mg/dL (ref 8.9–10.3)
Chloride: 102 mmol/L (ref 98–111)
Creatinine, Ser: 1.34 mg/dL — ABNORMAL HIGH (ref 0.61–1.24)
GFR, Estimated: 60 mL/min — ABNORMAL LOW (ref >60)
Glucose, Bld: 95 mg/dL (ref 70–99)
Potassium: 4.1 mmol/L (ref 3.5–5.1)
Sodium: 136 mmol/L (ref 135–145)

## 2023-12-18 LAB — C3 COMPLEMENT: C3 Complement: 205 mg/dL — ABNORMAL HIGH (ref 82–167)

## 2023-12-18 LAB — RHEUMATOID FACTOR: Rheumatoid fact SerPl-aCnc: <10 [IU]/mL (ref <14.0)

## 2023-12-18 LAB — C4 COMPLEMENT: Complement C4, Body Fluid: 24 mg/dL (ref 12–38)

## 2023-12-18 SURGERY — BRONCHOSCOPY, WITH EBUS
Anesthesia: General | Laterality: Right

## 2023-12-18 MED ORDER — ONDANSETRON HCL 4 MG/2ML IJ SOLN
INTRAMUSCULAR | Status: DC | PRN
  Administered 2023-12-18: 4 mg via INTRAVENOUS

## 2023-12-18 MED ORDER — CHLORHEXIDINE GLUCONATE 0.12 % MT SOLN
OROMUCOSAL | Status: AC
  Filled 2023-12-18: qty 15

## 2023-12-18 MED ORDER — PROPOFOL 10 MG/ML IV BOLUS
INTRAVENOUS | Status: DC | PRN
  Administered 2023-12-18: 150 mg via INTRAVENOUS
  Administered 2023-12-18: 150 ug/kg/min via INTRAVENOUS

## 2023-12-18 MED ORDER — FENTANYL CITRATE (PF) 100 MCG/2ML IJ SOLN
INTRAMUSCULAR | Status: AC
  Filled 2023-12-18: qty 2

## 2023-12-18 MED ORDER — LIDOCAINE 2% (20 MG/ML) 5 ML SYRINGE
INTRAMUSCULAR | Status: DC | PRN
  Administered 2023-12-18: 100 mg via INTRAVENOUS

## 2023-12-18 MED ORDER — SODIUM CHLORIDE 0.9 % IV SOLN
INTRAVENOUS | Status: AC | PRN
  Administered 2023-12-18: 500 mL via INTRAMUSCULAR

## 2023-12-18 MED ORDER — CHLORHEXIDINE GLUCONATE 0.12 % MT SOLN
OROMUCOSAL | Status: DC | PRN
  Administered 2023-12-18: 15 mL via OROMUCOSAL

## 2023-12-18 MED ORDER — ROCURONIUM BROMIDE 10 MG/ML (PF) SYRINGE
PREFILLED_SYRINGE | INTRAVENOUS | Status: DC | PRN
  Administered 2023-12-18: 80 mg via INTRAVENOUS

## 2023-12-18 MED ORDER — DEXAMETHASONE SOD PHOSPHATE PF 10 MG/ML IJ SOLN
INTRAMUSCULAR | Status: DC | PRN
  Administered 2023-12-18: 10 mg via INTRAVENOUS

## 2023-12-18 MED ORDER — FENTANYL CITRATE (PF) 250 MCG/5ML IJ SOLN
INTRAMUSCULAR | Status: DC | PRN
Start: 2023-12-18 — End: 2023-12-18
  Administered 2023-12-18 (×2): 50 ug via INTRAVENOUS

## 2023-12-18 MED ORDER — SUGAMMADEX SODIUM 200 MG/2ML IV SOLN
INTRAVENOUS | Status: DC | PRN
  Administered 2023-12-18 (×2): 200 mg via INTRAVENOUS

## 2023-12-18 SURGICAL SUPPLY — 31 items
ADAPTER VALVE BIOPSY EBUS (MISCELLANEOUS) IMPLANT
BRUSH CYTOL CELLEBRITY 1.5X140 (MISCELLANEOUS) IMPLANT
CANISTER SUCT 3000ML PPV (MISCELLANEOUS) ×3 IMPLANT
CONT SPEC 4OZ CLIKSEAL STRL BL (MISCELLANEOUS) ×3 IMPLANT
COVER BACK TABLE 60X90IN (DRAPES) ×3 IMPLANT
FORCEPS BIOP RJ4 1.8 (CUTTING FORCEPS) IMPLANT
GAUZE SPONGE 4X4 12PLY STRL (GAUZE/BANDAGES/DRESSINGS) ×3 IMPLANT
GLOVE BIO SURGEON STRL SZ7.5 (GLOVE) ×3 IMPLANT
GOWN STRL REUS W/ TWL LRG LVL3 (GOWN DISPOSABLE) ×3 IMPLANT
GOWN STRL REUS W/ TWL XL LVL3 (GOWN DISPOSABLE) ×3 IMPLANT
KIT CLEAN ENDO COMPLIANCE (KITS) ×6 IMPLANT
KIT TURNOVER KIT B (KITS) ×3 IMPLANT
MARKER SKIN DUAL TIP RULER LAB (MISCELLANEOUS) ×3 IMPLANT
NDL ASPIRATION VIZISHOT 19G (NEEDLE) IMPLANT
NDL ASPIRATION VIZISHOT 21G (NEEDLE) IMPLANT
NEEDLE ASPIRATION VIZISHOT 19G (NEEDLE) IMPLANT
NEEDLE ASPIRATION VIZISHOT 21G (NEEDLE) IMPLANT
NS IRRIG 1000ML POUR BTL (IV SOLUTION) ×3 IMPLANT
OIL SILICONE PENTAX (PARTS (SERVICE/REPAIRS)) ×3 IMPLANT
PAD ARMBOARD 7.5X6 YLW CONV (MISCELLANEOUS) ×6 IMPLANT
SYR 20ML ECCENTRIC (SYRINGE) ×6 IMPLANT
SYR 20ML LL LF (SYRINGE) ×6 IMPLANT
SYR 50ML SLIP (SYRINGE) IMPLANT
SYR 5ML LUER SLIP (SYRINGE) ×3 IMPLANT
TOWEL GREEN STERILE FF (TOWEL DISPOSABLE) ×3 IMPLANT
TRAP SPECIMEN MUCOUS 40CC (MISCELLANEOUS) IMPLANT
TUBE CONNECTING 20X1/4 (TUBING) ×6 IMPLANT
UNDERPAD 30X30 (UNDERPADS AND DIAPERS) ×3 IMPLANT
VALVE BIOPSY SINGLE USE (MISCELLANEOUS) ×3 IMPLANT
VALVE SUCTION BRONCHIO DISP (MISCELLANEOUS) ×3 IMPLANT
WATER STERILE IRR 1000ML POUR (IV SOLUTION) ×3 IMPLANT

## 2023-12-18 NOTE — Anesthesia Preprocedure Evaluation (Addendum)
 Anesthesia Evaluation  Patient identified by MRN, date of birth, ID band Patient awake    Reviewed: Allergy & Precautions, H&P , NPO status , Patient's Chart, lab work & pertinent test results  History of Anesthesia Complications Negative for: history of anesthetic complications  Airway Mallampati: II  TM Distance: >3 FB Neck ROM: Full    Dental no notable dental hx.    Pulmonary neg COPD, former smoker Lung mass Recurrent pleural effusion    Pulmonary exam normal breath sounds clear to auscultation       Cardiovascular hypertension, (-) angina (-) Past MI Normal cardiovascular exam Rhythm:Regular Rate:Normal     Neuro/Psych neg Seizures negative neurological ROS  negative psych ROS   GI/Hepatic Neg liver ROS, PUD,,,  Endo/Other  negative endocrine ROS    Renal/GU negative Renal ROS  negative genitourinary   Musculoskeletal negative musculoskeletal ROS (+)    Abdominal   Peds negative pediatric ROS (+)  Hematology  (+) Blood dyscrasia, anemia   Anesthesia Other Findings MALT lymphoma  Reproductive/Obstetrics negative OB ROS                              Anesthesia Physical Anesthesia Plan  ASA: 3  Anesthesia Plan: General   Post-op Pain Management:    Induction: Intravenous  PONV Risk Score and Plan: 2 and Ondansetron , Dexamethasone  and Treatment may vary due to age or medical condition  Airway Management Planned: Oral ETT  Additional Equipment: None  Intra-op Plan:   Post-operative Plan: Extubation in OR  Informed Consent: I have reviewed the patients History and Physical, chart, labs and discussed the procedure including the risks, benefits and alternatives for the proposed anesthesia with the patient or authorized representative who has indicated his/her understanding and acceptance.     Dental advisory given  Plan Discussed with: CRNA  Anesthesia Plan  Comments:          Anesthesia Quick Evaluation

## 2023-12-18 NOTE — Transfer of Care (Signed)
 Immediate Anesthesia Transfer of Care Note  Patient: Timothy Newton  Procedure(s) Performed: BRONCHOSCOPY, WITH EBUS (Right) BRONCHOSCOPY, WITH NEEDLE ASPIRATION BIOPSY  Patient Location: PACU  Anesthesia Type:General  Level of Consciousness: awake and alert   Airway & Oxygen Therapy: Patient Spontanous Breathing and Patient connected to face mask oxygen  Post-op Assessment: Report given to RN and Post -op Vital signs reviewed and stable  Post vital signs: Reviewed and stable  Last Vitals:  Vitals Value Taken Time  BP 131/76 12/18/23 10:10  Temp 98.4   Pulse 99 12/18/23 10:11  Resp 25 12/18/23 10:11  SpO2 95 % 12/18/23 10:11  Vitals shown include unfiled device data.  Last Pain:  Vitals:   12/18/23 0857  TempSrc: Temporal  PainSc: 0-No pain      Patients Stated Pain Goal: 0 (12/17/23 0810)  Complications: No notable events documented.

## 2023-12-18 NOTE — Anesthesia Procedure Notes (Signed)
 Procedure Name: Intubation Date/Time: 12/18/2023 9:33 AM  Performed by: Atanacio Arland HERO, CRNAPre-anesthesia Checklist: Patient identified, Emergency Drugs available, Suction available and Patient being monitored Patient Re-evaluated:Patient Re-evaluated prior to induction Oxygen Delivery Method: Circle System Utilized Preoxygenation: Pre-oxygenation with 100% oxygen Induction Type: IV induction Ventilation: Two handed mask ventilation required Laryngoscope Size: Mac and 4 Grade View: Grade III Tube type: Oral Tube size: 8.5 mm Number of attempts: 1 Airway Equipment and Method: Stylet Placement Confirmation: ETT inserted through vocal cords under direct vision, positive ETCO2 and breath sounds checked- equal and bilateral Secured at: 24 cm Tube secured with: Tape Dental Injury: Teeth and Oropharynx as per pre-operative assessment

## 2023-12-18 NOTE — Plan of Care (Signed)

## 2023-12-18 NOTE — Progress Notes (Signed)
 12/18/2023  Seen in f/u for effusion.  S: No events, still fairly high chest tube output. 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

## 2023-12-18 NOTE — Anesthesia Postprocedure Evaluation (Signed)
 Anesthesia Post Note  Patient: Timothy Newton  Procedure(s) Performed: BRONCHOSCOPY, WITH EBUS (Right) BRONCHOSCOPY, WITH NEEDLE ASPIRATION BIOPSY     Patient location during evaluation: PACU Anesthesia Type: General Level of consciousness: awake and alert Pain management: pain level controlled Vital Signs Assessment: post-procedure vital signs reviewed and stable Respiratory status: spontaneous breathing, nonlabored ventilation, respiratory function stable and patient connected to nasal cannula oxygen Cardiovascular status: blood pressure returned to baseline and stable Postop Assessment: no apparent nausea or vomiting Anesthetic complications: no   No notable events documented.  Last Vitals:  Vitals:   12/18/23 1035 12/18/23 1119  BP: 116/82 118/80  Pulse: 96 94  Resp: 17 18  Temp: 36.9 C 36.8 C  SpO2: 100% 92%    Last Pain:  Vitals:   12/18/23 1119  TempSrc: Oral  PainSc:                  [name]

## 2023-12-18 NOTE — Progress Notes (Signed)
 PROGRESS NOTE    Timothy Newton  FMW:[id number] DOB: 1961/01/24 DOA: 12/16/2023 PCP: Dorcus Lamar POUR, MD    Brief Narrative:  Timothy Newton is a 63 y.o. 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   Patient has been undergoing workup for lung mass by pulmonology.  He was seen in the ED on 10/5 for shortness of breath and underwent 1 L thoracentesis at that time with elevated white cells and positive mesothelioma cells.  Pulmonology recommended outpatient follow-up.   Patient followed up today with pulmonology and is short of breath again with recurrent pleural effusion.  Recommended to come to the ED for chest tube placement, further workup, EBUS and biopsy.  Assessment and Plan: Lung masses Recurrent pleural effusion > Concern for lung nodules/masses and recurrent pleural effusions.  > Has undergone multiple thoracentesis with fluid analysis initially appearing nonmalignant, but with suspicion still high. > Sent by pulmonologist due to recurrent shortness of breath/reaccumulation of pleural effusion.  Plan for chest tube which has been placed in the ED and for EBUS with biopsy on 11/13 -If cyto on fluid and path bronch neg, will need colonoscopy    Fever -defer abx to PCCM if needed-- culture with NGTD, no WBC count   Hypertension - Continue home lisinopril    History of PUD History of MALT lymphoma - Noted   DVT prophylaxis: SCDs Start: 12/16/23 1304    Code Status: Full Code   Disposition Plan:  Level of care: Telemetry Status is: Inpatient     Consultants:  PCCM   Subjective: No SOB, chest tube with output  Objective: Vitals:   12/17/23 2054 12/17/23 2326 12/18/23 0426 12/18/23 0857  BP: 126/80 129/84 106/70 138/89  Pulse: 90 95 92 98  Resp: 20 20 20 18   Temp: 99 F (37.2 C) 98.7 F (37.1 C) 99.3 F (37.4 C) (!) 97.1 F (36.2 C)  TempSrc: Oral Oral Oral Temporal  SpO2: 95%  98% 92% 92%  Weight:    111.1 kg  Height:    6' 2 (1.88 m)    Intake/Output Summary (Last 24 hours) at 12/18/2023 9061 Last data filed at 12/17/2023 2054 Gross per 24 hour  Intake --  Output 600 ml  Net -600 ml   Filed Weights   12/16/23 1031 12/18/23 0857  Weight: 108.9 kg 111.1 kg    Examination:   General: Appearance:    Obese male in no acute distress     Lungs:     Chest tube in place, respirations unlabored  Heart:    Normal heart rate.     MS:   All extremities are intact.    Neurologic:   Awake, alert, oriented x 3. No apparent focal neurological           defect.        Data Reviewed: I have personally reviewed following labs and imaging studies  CBC: Recent Labs  Lab 12/16/23 1047 12/17/23 0207 12/18/23 0518  WBC 8.1 13.4* 9.7  HGB 14.3 13.2 13.1  HCT 45.9 41.0 40.7  MCV 93.9 90.3 90.4  PLT 245 224 227   Basic Metabolic Panel: Recent Labs  Lab 12/16/23 1047 12/17/23 0207 12/18/23 0518  NA 139 137 136  K 4.3 3.9 4.1  CL 106 104 102  CO2 25 25 23   GLUCOSE 110* 125* 95  BUN 15 14 15   CREATININE 1.28* 1.27* 1.34*  CALCIUM 10.1 9.4 9.8   GFR: Estimated Creatinine Clearance:  75.8 mL/min (A) (by C-G formula based on SCr of 1.34 mg/dL (H)). Liver Function Tests: Recent Labs  Lab 12/17/23 0207  AST 17  ALT 13  ALKPHOS 49  BILITOT 1.2  PROT 8.2*  ALBUMIN 2.7*   No results for input(s): LIPASE, AMYLASE in the last 168 hours. No results for input(s): AMMONIA in the last 168 hours. Coagulation Profile: No results for input(s): INR, PROTIME in the last 168 hours. Cardiac Enzymes: Recent Labs  Lab 12/16/23 1304  CKTOTAL 56   BNP (last 3 results) No results for input(s): PROBNP in the last 8760 hours. HbA1C: No results for input(s): HGBA1C in the last 72 hours. CBG: No results for input(s): GLUCAP in the last 168 hours. Lipid Profile: No results for input(s): CHOL, HDL, LDLCALC, TRIG, CHOLHDL, LDLDIRECT  in the last 72 hours. Thyroid Function Tests: No results for input(s): TSH, T4TOTAL, FREET4, T3FREE, THYROIDAB in the last 72 hours. Anemia Panel: Recent Labs    12/16/23 1304  FERRITIN 37   Sepsis Labs: No results for input(s): PROCALCITON, LATICACIDVEN in the last 168 hours.  Recent Results (from the past 240 hours)  Body fluid culture w Gram Stain     Status: None (Preliminary result)   Collection Time: 12/16/23 12:58 PM   Specimen: Path fluid; Pleural Fluid  Result Value Ref Range Status   Specimen Description FLUID  Final   Special Requests NONE  Final   Gram Stain   Final    FEW WBC PRESENT, PREDOMINANTLY MONONUCLEAR NO ORGANISMS SEEN    Culture   Final    NO GROWTH 2 DAYS Performed at [facility], [address], [place], [place] [address]    Report Status PENDING  Incomplete         Radiology Studies: CT CHEST ABDOMEN PELVIS W CONTRAST Result Date: 12/17/2023 CLINICAL DATA:  Hematologic malignancy staging, history of MALT, lung mass with worsening effusion * Tracking Code: BO * EXAM: CT CHEST, ABDOMEN, AND PELVIS WITH CONTRAST TECHNIQUE: Multidetector CT imaging of the chest, abdomen and pelvis was performed following the standard protocol during bolus administration of intravenous contrast. RADIATION DOSE REDUCTION: This exam was performed according to the departmental dose-optimization program which includes automated exposure control, adjustment of the mA and/or kV according to patient size and/or use of iterative reconstruction technique. CONTRAST:  75mL OMNIPAQUE  IOHEXOL  350 MG/ML SOLN COMPARISON:  CT chest angiogram, 11/09/2023, CT abdomen pelvis, 12/26/2022 FINDINGS: CT CHEST FINDINGS Cardiovascular: No significant vascular findings. Normal heart size. No pericardial effusion. Mediastinum/Nodes: Unchanged enlarged mediastinal lymph nodes, pretracheal nodes measuring up to 2.4 x 1.1 cm (series 2, image 19). Thyroid gland, trachea, and esophagus  demonstrate no significant findings. Lungs/Pleura: Diminished volume of a loculated right pleural effusion containing numerous small air loculations, pigtail catheter with formed pigtail in the dependent right pleural space (series 2, image 47). Very dense consolidation of the right upper lobe, not significantly changed, with additional very dense consolidation of the right middle lobe also not significantly changed. There is however somewhat improved aeration of the right lower lobe secondary to diminished effusion volume. Additional dense consolidation of the posterior suprahilar left upper lobe is not significantly changed, nor are numerous scattered irregular opacities throughout the remainder of the aerated portions of the lungs (series 4, image 40, 18). Trace left pleural effusion. Musculoskeletal: No chest wall abnormality. No acute osseous findings. CT ABDOMEN PELVIS FINDINGS Hepatobiliary: No solid liver abnormality is seen. Contracted gallbladder. No gallstones, gallbladder wall thickening, or biliary dilatation.  Pancreas: Unremarkable. No pancreatic ductal dilatation or surrounding inflammatory changes. Spleen: Normal in size without significant abnormality. Adrenals/Urinary Tract: Adrenal glands are unremarkable. Kidneys are normal, without renal calculi, solid lesion, or hydronephrosis. Bladder is unremarkable. Stomach/Bowel: Stomach is within normal limits. Appendix appears normal. Similar circumferential mass of the terminal ileum measuring 6.1 x 3.5 x 3.3 cm (series 6, image 65, series 2, image 101). Pancolonic diverticulosis. Vascular/Lymphatic: No significant vascular findings are present. No enlarged abdominal or pelvic lymph nodes. Reproductive: Mild Prostatomegaly. Other: No abdominal wall hernia or abnormality. No ascites. Musculoskeletal: No acute osseous findings. IMPRESSION: 1. Diminished volume of a loculated right pleural effusion containing numerous small air loculations, pigtail catheter  with formed pigtail in the dependent right pleural space. 2. Very dense consolidation of the right upper lobe, not significantly changed, with additional very dense consolidation of the right middle lobe also not significantly changed. There is however somewhat improved aeration of the right lower lobe secondary to diminished effusion volume. 3. Additional dense consolidation of the posterior suprahilar left upper lobe is not significantly changed, nor are numerous scattered irregular opacities throughout the remainder of the aerated portions of the lungs. 4. Constellation of pulmonary findings is suggestive of pulmonary lymphomatous involvement especially inasmuch as right middle lobe consolidation was present on a relatively remote CT examination of the abdomen pelvis dated 2024. 5. Unchanged enlarged mediastinal lymph nodes. 6. 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 7. Pancolonic diverticulosis without evidence of acute diverticulitis. Electronically Signed   By: Marolyn JONETTA Jaksch M.D.   On: 12/17/2023 13:16   DG Chest Port 1 View Result Date: 12/17/2023 EXAM: 1 VIEW(S) XRAY OF THE CHEST 12/17/2023 06:52:00 AM COMPARISON: AP radiograph of the chest dated 12/16/2023. CLINICAL HISTORY: [id number] Chest tube in place [id number] Chest tube in place FINDINGS: LINES, TUBES AND DEVICES: A pigtail catheter is again seen projecting over the right lung base. LUNGS AND PLEURA: Interval improved aeration of the right lung, particularly the right mid lung zone. A right-sided pleural effusion has likely decreased in the interim. The left lung appears clear. No focal pulmonary opacity. No pulmonary edema. No pneumothorax. HEART AND MEDIASTINUM: No acute abnormality of the cardiac and mediastinal silhouettes. BONES AND SOFT TISSUES: No acute osseous abnormality. IMPRESSION: 1. Interval improved aeration of the right lung, particularly the right mid lung zone. 2.  Likely decreased right-sided pleural effusion. 3. Pigtail catheter in place over the right lung base. Electronically signed by: Evalene Coho MD 12/17/2023 06:57 AM EST RP Workstation: [id number]   DG Chest 2 View Result Date: 12/16/2023 EXAM: 2 VIEW(S) XRAY OF THE CHEST 12/16/2023 01:55:00 PM COMPARISON: 11/09/2023 CLINICAL HISTORY: shortness of breath FINDINGS: LUNGS AND PLEURA: Complete opacification of right hemithorax, significantly worsened since prior study. Persistent masslike airspace opacity in the left suprahilar region. No left pleural effusion. No pneumothorax. HEART AND MEDIASTINUM: No acute abnormality of the cardiac and mediastinal silhouettes. BONES AND SOFT TISSUES: No acute osseous abnormality. IMPRESSION: 1. Complete opacification of the right hemithorax, significantly worsened since the prior study, probably representing a combination of large effusion and compressive atelectasis versus airspace disease. 2. Similarly appearing masslike opacity in the left suprahilar region. Electronically signed by: Rogelia Myers MD 12/16/2023 02:18 PM EST RP Workstation: [id number]   DG Chest Port 1 View Result Date: 12/16/2023 EXAM: 1 VIEW(S) XRAY OF THE CHEST 12/16/2023 01:16:18 PM COMPARISON: 11/09/2023 CLINICAL HISTORY: Chest tube in place. FINDINGS: LINES, TUBES AND DEVICES:  Right chest tube with tip terminating along the right lung base. LUNGS AND PLEURA: Persistent opacification of right hemithorax, despite the chest tube. Known left perihilar mass. No pulmonary edema. No pneumothorax. HEART AND MEDIASTINUM: Atherosclerotic calcifications. No acute abnormality of the cardiac and mediastinal silhouettes. BONES AND SOFT TISSUES: No acute osseous abnormality. IMPRESSION: 1. Persistent opacification of the right hemithorax despite the chest tube. 2. Persistent left perihilar density Electronically signed by: Maude Stammer MD 12/16/2023 01:43 PM EST RP Workstation: [id number]         Scheduled Meds:  [MAR Hold] lisinopril   30 mg Oral Daily   [MAR Hold] pneumococcal 20-valent conjugate vaccine  0.5 mL Intramuscular Tomorrow-1000   [MAR Hold] sodium chloride  flush  10 mL Intrapleural Q8H   [MAR Hold] sodium chloride  flush  3 mL Intravenous Q12H   Continuous Infusions:  sodium chloride  10 mL/hr at 12/18/23 0913     LOS: 1 day    Time spent: 45 minutes spent on chart review, discussion with nursing staff, consultants, updating family and interview/physical exam; more than 50% of that time was spent in counseling and/or coordination of care.    [name], DO [facility] Available via Epic secure chat 7am-7pm After these hours, please refer to coverage provider listed on [email or web address] 12/18/2023, 9:38 AM

## 2023-12-18 NOTE — Op Note (Signed)
 Flexible and EBUS Bronchoscopy Procedure Note  Bynum Mccullars  [id number]  [birth year]  Date:12/18/23  Time:6:17 PM   Provider Performing:Mirabel Ahlgren C Claudene   Procedure: Flexible bronchoscopy and EBUS Bronchoscopy  Indication(s) Abnormal CT  Consent Risks of the procedure as well as the alternatives and risks of each were explained to the patient and/or caregiver.  Consent for the procedure was obtained.  Anesthesia General Anesthesia   Time Out Verified patient identification, verified procedure, site/side was marked, verified correct patient position, special equipment/implants available, medications/allergies/relevant history reviewed, required imaging and test results available.   Sterile Technique Usual hand hygiene, masks, gowns, and gloves were used   Procedure Description The EBUS bronchoscope was advanced into airway with RUL biopsied and sent for slide, cell block, and/or culture.  The EBUS bronchoscope was removed after assuring no active bleeding from biopsy site.  Findings:  Due to unique anatomy just did TBBx by EBUS along apical wall of RUL: slides showing marked lymphocytes c/w suspected diagnosis of lymphoma.  Cyto tech to inform pathologist of concern for lymphoma.  No air leak after biopsy in R pigtail.  Complications/Tolerance None; patient tolerated the procedure well. Chest X-ray is not needed post procedure.   EBL Minimal   Specimen(s) RUL EBUS FNA slide/cell block

## 2023-12-19 ENCOUNTER — Inpatient Hospital Stay (HOSPITAL_COMMUNITY)

## 2023-12-19 ENCOUNTER — Encounter (HOSPITAL_COMMUNITY): Payer: Self-pay | Admitting: Internal Medicine

## 2023-12-19 DIAGNOSIS — R918 Other nonspecific abnormal finding of lung field: Secondary | ICD-10-CM | POA: Diagnosis not present

## 2023-12-19 DIAGNOSIS — Z8572 Personal history of non-Hodgkin lymphomas: Secondary | ICD-10-CM | POA: Diagnosis not present

## 2023-12-19 DIAGNOSIS — J90 Pleural effusion, not elsewhere classified: Secondary | ICD-10-CM | POA: Diagnosis not present

## 2023-12-19 LAB — BODY FLUID CULTURE W GRAM STAIN: Culture: NO GROWTH

## 2023-12-19 LAB — BASIC METABOLIC PANEL WITH GFR
Anion gap: 11 (ref 5–15)
BUN: 20 mg/dL (ref 8–23)
CO2: 25 mmol/L (ref 22–32)
Calcium: 9.7 mg/dL (ref 8.9–10.3)
Chloride: 99 mmol/L (ref 98–111)
Creatinine, Ser: 1.1 mg/dL (ref 0.61–1.24)
GFR, Estimated: >60 mL/min (ref >60)
Glucose, Bld: 200 mg/dL — ABNORMAL HIGH (ref 70–99)
Potassium: 4.1 mmol/L (ref 3.5–5.1)
Sodium: 135 mmol/L (ref 135–145)

## 2023-12-19 LAB — CBC
HCT: 40.8 % (ref 39.0–52.0)
Hemoglobin: 13.1 g/dL (ref 13.0–17.0)
MCH: 29 pg (ref 26.0–34.0)
MCHC: 32.1 g/dL (ref 30.0–36.0)
MCV: 90.5 fL (ref 80.0–100.0)
Platelets: 255 K/uL (ref 150–400)
RBC: 4.51 MIL/uL (ref 4.22–5.81)
RDW: 12.2 % (ref 11.5–15.5)
WBC: 9.5 K/uL (ref 4.0–10.5)
nRBC: 0 % (ref 0.0–0.2)

## 2023-12-19 MED ORDER — SODIUM CHLORIDE 0.9% FLUSH
10.0000 mL | Freq: Three times a day (TID) | INTRAVENOUS | Status: DC
  Administered 2023-12-19 – 2023-12-23 (×8): 10 mL via INTRAPLEURAL

## 2023-12-19 NOTE — Progress Notes (Signed)
 Transition of Care [facility]) - Inpatient Brief Assessment   Patient Details  Name: Timothy Newton MRN: [id number] Date of Birth: 12-31-1960  Transition of [facility] CM/SW Contact:    Rosaline JONELLE Joe, RN Phone Number: 12/19/2023, 3:58 PM   Clinical Narrative: CM met with the patient at the bedside to discuss IP Care management needs.  The patient admitted for the hospital with pleural effusion and currently remains with Right sided chest tube.  Patient remains on RA.  Patient lives with his son at the home.  He has no DME at the home.  Patient is a non-smoker.  Patient is normally independent and was driving prior to hospitalization.  PCP - Dr. Abdul.  CM will continue to follow the patient.  Patient currently has right sided chest tube in place.   Transition of Care Asessment: Insurance and Status: (P) Insurance coverage has been reviewed Patient has primary care physician: (P) Yes Home environment has been reviewed: (P) from home with son Prior level of function:: (P) Independent Prior/Current Home Services: (P) No current home services Social Drivers of Health Review: (P) SDOH reviewed needs interventions Readmission risk has been reviewed: (P) Yes Transition of care needs: (P) transition of care needs identified, TOC will continue to follow

## 2023-12-19 NOTE — Progress Notes (Signed)
 Incentive spirometer education provided. Patient verbalized understanding and provided return demonstration. Timothy Newton

## 2023-12-19 NOTE — Progress Notes (Signed)
 Notified Dr. Vann that pt would like to speak with Dr. Claudene. patient declined to verbalized concern. MD aware. Sherline Jubilee

## 2023-12-19 NOTE — Progress Notes (Signed)
 12/19/2023  Seen in f/u for effusion & post-bronchoscopy.  S: Today he has questions about how long he needs his chest tube and when he will get bronch results.   O:    12/19/2023    8:52 AM 12/19/2023    3:59 AM 12/19/2023   12:45 AM  Vitals with BMI  Systolic 129 119 884  Diastolic 73 84 80  Pulse 92 75 76   Elderly man sitting up in bed in NAD [place]/AT Chest tube with serous output Breathing comfortably on RA Abd nondistended   Chest tube output 400cc overnight, nothing recorded from day shift.   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 Has known h/o MALT lymphoma.   P: Continue pigtail, put to suction to encourage full drainage. No mobility restrictions.  CXR today to assess for lung apposition Bronch results reviewed-- cyto still pending Cyto on pleural fluid not revealing-- has chronic inflammation. If cyto on bronch unrevealing, needs c-scope.  If pigtail output drops off will try talc, assuming US  shows apposition> chest tube output remains too high. May end up requiring pleurX. CXR today to assess pleural apposition with tube in. We discussed that this effusion is most likely malignant in the setting of known lymphoma and a lung mass. We reviewed that the tube needs to stay until drainage is significantly less or we have a plan with Oncology, assuming his bronch cyto is positive.   Dr. Claudene to follow up this weekend.   Leita SHAUNNA Gaskins, DO 12/19/23 11:37 AM [facility]  For contact information, see Amion. If no response to pager, please call PCCM consult pager. After hours, 7PM- 7AM, please call Elink.

## 2023-12-19 NOTE — Plan of Care (Signed)

## 2023-12-19 NOTE — Plan of Care (Signed)

## 2023-12-19 NOTE — Progress Notes (Signed)
 PROGRESS NOTE    Terel Bann  FMW:[id number] DOB: 08-19-1960 DOA: 12/16/2023 PCP: Dorcus Lamar POUR, MD    Brief Narrative:  Timothy Newton is a 63 y.o. 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   Patient has been undergoing workup for lung mass by pulmonology.  He was seen in the ED on 10/5 for shortness of breath and underwent 1 L thoracentesis at that time with elevated white cells and positive mesothelioma cells.  Pulmonology recommended outpatient follow-up.   Patient followed up today with pulmonology and is short of breath again with recurrent pleural effusion.  Recommended to come to the ED for chest tube placement, further workup, EBUS and biopsy.  Assessment and Plan: Lung masses Recurrent pleural effusion > Concern for lung nodules/masses and recurrent pleural effusions.  > Has undergone multiple thoracentesis with fluid analysis initially appearing nonmalignant, but with suspicion still high. > Sent by pulmonologist due to recurrent shortness of breath/reaccumulation of pleural effusion.  S/p chest tube which has been placed in the ED and for EBUS with biopsy on 11/13-- ? Lymphoma -pathology pending  Fever -resolved off abx   Hypertension - Continue home lisinopril    History of PUD History of MALT lymphoma - Noted   DVT prophylaxis: SCDs Start: 12/16/23 1304    Code Status: Full Code   Disposition Plan:  Level of care: Med-Surg Status is: Inpatient     Consultants:  PCCM   Subjective: No SOB, chest tube with output-- asking about next steps  Objective: Vitals:   12/18/23 2011 12/19/23 0045 12/19/23 0359 12/19/23 0852  BP: 124/81 115/80 119/84 129/73  Pulse: 85 76 75 92  Resp: 20 20 20    Temp: 98.4 F (36.9 C) (!) 97.3 F (36.3 C) (!) 97.5 F (36.4 C) 98.1 F (36.7 C)  TempSrc: Oral Oral Oral Oral  SpO2: 95% 96% 96% 95%  Weight:      Height:         Intake/Output Summary (Last 24 hours) at 12/19/2023 1239 Last data filed at 12/19/2023 0152 Gross per 24 hour  Intake --  Output 1325 ml  Net -1325 ml   Filed Weights   12/16/23 1031 12/18/23 0857  Weight: 108.9 kg 111.1 kg    Examination:   General: Appearance:    Obese male in no acute distress     Lungs:     Chest tube in place, respirations unlabored  Heart:    Normal heart rate.     MS:   All extremities are intact.    Neurologic:   Awake, alert, oriented x 3. No apparent focal neurological           defect.        Data Reviewed: I have personally reviewed following labs and imaging studies  CBC: Recent Labs  Lab 12/16/23 1047 12/17/23 0207 12/18/23 0518 12/19/23 0505  WBC 8.1 13.4* 9.7 9.5  HGB 14.3 13.2 13.1 13.1  HCT 45.9 41.0 40.7 40.8  MCV 93.9 90.3 90.4 90.5  PLT 245 224 227 255   Basic Metabolic Panel: Recent Labs  Lab 12/16/23 1047 12/17/23 0207 12/18/23 0518 12/19/23 0732  NA 139 137 136 135  K 4.3 3.9 4.1 4.1  CL 106 104 102 99  CO2 25 25 23 25   GLUCOSE 110* 125* 95 200*  BUN 15 14 15 20   CREATININE 1.28* 1.27* 1.34* 1.10  CALCIUM 10.1 9.4 9.8 9.7   GFR: Estimated Creatinine Clearance: 92.4  mL/min (by C-G formula based on SCr of 1.1 mg/dL). Liver Function Tests: Recent Labs  Lab 12/17/23 0207  AST 17  ALT 13  ALKPHOS 49  BILITOT 1.2  PROT 8.2*  ALBUMIN 2.7*   No results for input(s): LIPASE, AMYLASE in the last 168 hours. No results for input(s): AMMONIA in the last 168 hours. Coagulation Profile: No results for input(s): INR, PROTIME in the last 168 hours. Cardiac Enzymes: Recent Labs  Lab 12/16/23 1304  CKTOTAL 56   BNP (last 3 results) No results for input(s): PROBNP in the last 8760 hours. HbA1C: No results for input(s): HGBA1C in the last 72 hours. CBG: No results for input(s): GLUCAP in the last 168 hours. Lipid Profile: No results for input(s): CHOL, HDL, LDLCALC, TRIG,  CHOLHDL, LDLDIRECT in the last 72 hours. Thyroid Function Tests: No results for input(s): TSH, T4TOTAL, FREET4, T3FREE, THYROIDAB in the last 72 hours. Anemia Panel: Recent Labs    12/16/23 1304  FERRITIN 37   Sepsis Labs: No results for input(s): PROCALCITON, LATICACIDVEN in the last 168 hours.  Recent Results (from the past 240 hours)  Body fluid culture w Gram Stain     Status: None   Collection Time: 12/16/23 12:58 PM   Specimen: Path fluid; Pleural Fluid  Result Value Ref Range Status   Specimen Description FLUID  Final   Special Requests NONE  Final   Gram Stain   Final    FEW WBC PRESENT, PREDOMINANTLY MONONUCLEAR NO ORGANISMS SEEN    Culture   Final    NO GROWTH 3 DAYS Performed at [facility], [address] [place], [place] [address]    Report Status 12/19/2023 FINAL  Final         Radiology Studies: No results found.       Scheduled Meds:  lisinopril   30 mg Oral Daily   pneumococcal 20-valent conjugate vaccine  0.5 mL Intramuscular Tomorrow-1000   sodium chloride  flush  10 mL Intrapleural Q8H   sodium chloride  flush  10 mL Intrapleural Q8H   sodium chloride  flush  3 mL Intravenous Q12H   Continuous Infusions:     LOS: 2 days    Time spent: 45 minutes spent on chart review, discussion with nursing staff, consultants, updating family and interview/physical exam; more than 50% of that time was spent in counseling and/or coordination of care.    [name], DO [facility] Available via Epic secure chat 7am-7pm After these hours, please refer to coverage provider listed on [email or web address] 12/19/2023, 12:39 PM

## 2023-12-20 ENCOUNTER — Encounter (HOSPITAL_COMMUNITY): Payer: Self-pay | Admitting: Internal Medicine

## 2023-12-20 DIAGNOSIS — J90 Pleural effusion, not elsewhere classified: Secondary | ICD-10-CM | POA: Diagnosis not present

## 2023-12-20 DIAGNOSIS — R918 Other nonspecific abnormal finding of lung field: Secondary | ICD-10-CM | POA: Diagnosis not present

## 2023-12-20 NOTE — Plan of Care (Signed)
 21:19   10cc sterile saline flushed to chest tube per order. Pt reported no discomfort during flush. Chest tube patent to -20cm suction s/p saline flush per order.   Problem: Education: Goal: Knowledge of General Education information will improve Description: Including pain rating scale, medication(s)/side effects and non-pharmacologic comfort measures Outcome: Progressing   Problem: Health Behavior/Discharge Planning: Goal: Ability to manage health-related needs will improve Outcome: Progressing   Problem: Clinical Measurements: Goal: Ability to maintain clinical measurements within normal limits will improve Outcome: Progressing Goal: Will remain free from infection Outcome: Progressing Goal: Diagnostic test results will improve Outcome: Progressing Goal: Respiratory complications will improve Outcome: Progressing Goal: Cardiovascular complication will be avoided Outcome: Progressing   Problem: Activity: Goal: Risk for activity intolerance will decrease Outcome: Progressing   Problem: Nutrition: Goal: Adequate nutrition will be maintained Outcome: Progressing   Problem: Coping: Goal: Level of anxiety will decrease Outcome: Progressing   Problem: Elimination: Goal: Will not experience complications related to bowel motility Outcome: Progressing Goal: Will not experience complications related to urinary retention Outcome: Progressing   Problem: Pain Managment: Goal: General experience of comfort will improve and/or be controlled Outcome: Progressing   Problem: Safety: Goal: Ability to remain free from injury will improve Outcome: Progressing   Problem: Skin Integrity: Goal: Risk for impaired skin integrity will decrease Outcome: Progressing

## 2023-12-20 NOTE — Progress Notes (Signed)
 PROGRESS NOTE    Timothy Newton  FMW:[id number] DOB: 1960/10/10 DOA: 12/16/2023 PCP: Dorcus Lamar POUR, MD    Brief Narrative:  Timothy Newton is a 63 y.o. 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   Patient has been undergoing workup for lung mass by pulmonology.  He was seen in the ED on 10/5 for shortness of breath and underwent 1 L thoracentesis at that time with elevated white cells and positive mesothelioma cells.  Pulmonology recommended outpatient follow-up.   Patient followed up today with pulmonology and is short of breath again with recurrent pleural effusion.  Recommended to come to the ED for chest tube placement, further workup, EBUS and biopsy.  Assessment and Plan: Lung masses Recurrent pleural effusion > Concern for lung nodules/masses and recurrent pleural effusions.  > Has undergone multiple thoracentesis with fluid analysis initially appearing nonmalignant, but with suspicion still high. > Sent by pulmonologist due to recurrent shortness of breath/reaccumulation of pleural effusion.  S/p chest tube which has been placed in the ED and for EBUS with biopsy on 11/13-- ? Lymphoma -pathology pending  Fever -resolved off abx   Hypertension - Continue home lisinopril    History of PUD History of MALT lymphoma - Noted   DVT prophylaxis: SCDs Start: 12/16/23 1304    Code Status: Full Code   Disposition Plan:  Level of care: Med-Surg Status is: Inpatient     Consultants:  PCCM   Subjective: No overnight events  Objective: Vitals:   12/20/23 0057 12/20/23 0444 12/20/23 0833 12/20/23 1145  BP: 116/71 113/75 118/74 121/83  Pulse: 71 81 80 84  Resp: 20 20 16 17   Temp: (!) 97.5 F (36.4 C) (!) 97.5 F (36.4 C) (!) 97.5 F (36.4 C) (!) 97.5 F (36.4 C)  TempSrc: Oral Oral    SpO2: 96% 94% 92% 96%  Weight:      Height:        Intake/Output Summary (Last 24 hours) at  12/20/2023 1325 Last data filed at 12/20/2023 1148 Gross per 24 hour  Intake 320 ml  Output 1470 ml  Net -1150 ml   Filed Weights   12/16/23 1031 12/18/23 0857  Weight: 108.9 kg 111.1 kg    Examination:   General: Appearance:    Obese male in no acute distress     Lungs:     respirations unlabored  Heart:    Normal heart rate.     MS:   All extremities are intact.    Neurologic:   Awake, alert       Data Reviewed: I have personally reviewed following labs and imaging studies  CBC: Recent Labs  Lab 12/16/23 1047 12/17/23 0207 12/18/23 0518 12/19/23 0505  WBC 8.1 13.4* 9.7 9.5  HGB 14.3 13.2 13.1 13.1  HCT 45.9 41.0 40.7 40.8  MCV 93.9 90.3 90.4 90.5  PLT 245 224 227 255   Basic Metabolic Panel: Recent Labs  Lab 12/16/23 1047 12/17/23 0207 12/18/23 0518 12/19/23 0732  NA 139 137 136 135  K 4.3 3.9 4.1 4.1  CL 106 104 102 99  CO2 25 25 23 25   GLUCOSE 110* 125* 95 200*  BUN 15 14 15 20   CREATININE 1.28* 1.27* 1.34* 1.10  CALCIUM 10.1 9.4 9.8 9.7   GFR: Estimated Creatinine Clearance: 92.4 mL/min (by C-G formula based on SCr of 1.1 mg/dL). Liver Function Tests: Recent Labs  Lab 12/17/23 0207  AST 17  ALT 13  ALKPHOS  49  BILITOT 1.2  PROT 8.2*  ALBUMIN 2.7*   No results for input(s): LIPASE, AMYLASE in the last 168 hours. No results for input(s): AMMONIA in the last 168 hours. Coagulation Profile: No results for input(s): INR, PROTIME in the last 168 hours. Cardiac Enzymes: Recent Labs  Lab 12/16/23 1304  CKTOTAL 56   BNP (last 3 results) No results for input(s): PROBNP in the last 8760 hours. HbA1C: No results for input(s): HGBA1C in the last 72 hours. CBG: No results for input(s): GLUCAP in the last 168 hours. Lipid Profile: No results for input(s): CHOL, HDL, LDLCALC, TRIG, CHOLHDL, LDLDIRECT in the last 72 hours. Thyroid Function Tests: No results for input(s): TSH, T4TOTAL, FREET4, T3FREE,  THYROIDAB in the last 72 hours. Anemia Panel: No results for input(s): VITAMINB12, FOLATE, FERRITIN, TIBC, IRON, RETICCTPCT in the last 72 hours.  Sepsis Labs: No results for input(s): PROCALCITON, LATICACIDVEN in the last 168 hours.  Recent Results (from the past 240 hours)  Body fluid culture w Gram Stain     Status: None   Collection Time: 12/16/23 12:58 PM   Specimen: Path fluid; Pleural Fluid  Result Value Ref Range Status   Specimen Description FLUID  Final   Special Requests NONE  Final   Gram Stain   Final    FEW WBC PRESENT, PREDOMINANTLY MONONUCLEAR NO ORGANISMS SEEN    Culture   Final    NO GROWTH 3 DAYS Performed at [facility], [address], [place], [place] [address]    Report Status 12/19/2023 FINAL  Final         Radiology Studies: DG CHEST PORT 1 VIEW Result Date: 12/19/2023 CLINICAL DATA:  Follow-up right chest tube and pleural effusion. EXAM: PORTABLE CHEST 1 VIEW COMPARISON:  12/17/2023 FINDINGS: Stable right basilar pigtail pleural catheter. Mildly improved aeration of the right lung with slightly less pleural fluid and consolidation. Clear left lung. Mild peribronchial thickening. Normal-sized heart. Stable right glenoid cystic areas. IMPRESSION: 1. Mildly improved aeration of the right lung with slightly less pleural fluid and consolidation. 2. Stable right basilar pigtail pleural catheter. 3. Mild bronchitic changes. Electronically Signed   By: Elspeth Bathe M.D.   On: 12/19/2023 18:18         Scheduled Meds:  lisinopril   30 mg Oral Daily   pneumococcal 20-valent conjugate vaccine  0.5 mL Intramuscular Tomorrow-1000   sodium chloride  flush  10 mL Intrapleural Q8H   sodium chloride  flush  10 mL Intrapleural Q8H   sodium chloride  flush  3 mL Intravenous Q12H   Continuous Infusions:     LOS: 3 days    Time spent: 45 minutes spent on chart review, discussion with nursing staff, consultants, updating family and  interview/physical exam; more than 50% of that time was spent in counseling and/or coordination of care.    Timothy RAYMOND Bowl, DO [facility] Available via Epic secure chat 7am-7pm After these hours, please refer to coverage provider listed on [email or web address] 12/20/2023, 1:25 PM

## 2023-12-20 NOTE — Plan of Care (Signed)

## 2023-12-20 NOTE — Progress Notes (Signed)
 NAME:  Timothy Newton, MRN:  [id number], DOB:  04-06-1960, LOS: 3 ADMISSION DATE:  12/16/2023, CONSULTATION DATE:  12/16/23 REFERRING MD:  EDP, CHIEF COMPLAINT:  SOB   History of Present Illness:  63 year old man w/ hx of MALT who was being followed by [facility] for a lung mass and effusion that acutely worsened and required thora in our ER back in early October, path showing inflammation.  He has since followed up with [facility] who ordered a PET/CT then referred the patient back to us  for additional tissue sampling.  He was seen in our pulmonary clinic and noted to be very dyspneic with even minimal exertion so instructed to come to ER today.    [facility] PET 12/01/23>  1. Hypermetabolic bilateral consolidation or neoplasm. The changes are increased compared to 10/28/2023. Consider bronchoscopy for further evaluation.  2. Large right effusion with one area of hypermetabolic activity posteriorly.  3. Hypermetabolic distal ileal mass. Possibly lymphoma  4. Hypermetabolic mesenteric nodes   Pertinent  Medical History   Past Medical History:  Diagnosis Date   Anemia    Bleeding gastric ulcer    Blood transfusion    H. 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 rate 17, height 6' 2 (1.88 m), weight 111.1 kg, SpO2 96%.        Intake/Output Summary (Last 24 hours) at 12/20/2023 1255 Last data filed at 12/20/2023 1148 Gross per 24 hour  Intake 320 ml  Output 1470 ml  Net -1150 ml   Filed Weights   12/16/23 1031  12/18/23 0857  Weight: 108.9 kg 111.1 kg   Examination: General:  pleasant adult male sitting in bed in NAD Neuro: Alert, oriented/ appropriate, MAE  CV: rr PULM:  non labored, clear anteriorly, rales/ rhonchi in R base and diminished, right lateral pigtail ct to 20 cm sxn- flushed, patent, mostly dk sanguinous drainage, no airleak GI: soft, bs+ Extremities: warm/dry, no LE edema   Chest tube output> 345 ml/ 24hrs, additional since shift change  CXR 11/14 reviewed  Afebrile   Resolved problem list   Assessment and Plan   Abnormal imaging, + PET, mass like cavitary lesion on CT Right pleural effusion, recurrent  - Has known h/o MALT lymphoma.  Ipsilateral to a cavitary lesion from 12 years ago.  PET scan read from [facility] reviewed: does seem c/w a lymphoma type presentation although he has no B symptoms.  Odd presentation  - s/p CT placement 11/11 and bronchoscopy 11/13 - Aldolase 64, ANA neg, ANCA neg, antiscleroderma neg, RF neg, CCP neg, CRP 6.3, ESR 63, C3 205, C4 normal, CK normal.  P:  - Continue pigtail to sxn given ongoing output  - CT maintenance> flush q8hrs  - Following pleural (11/11) and bronch (11/13) cytology.  If bronch cytology negative, will need colonoscopy  - attending to decide on possible steroid trial  - CXR in am   Remainder per primary team.  Pulmonary will continue to follow.  Labs   CBC: Recent Labs  Lab 12/16/23 1047 12/17/23 0207 12/18/23 0518 12/19/23 0505  WBC 8.1 13.4* 9.7 9.5  HGB 14.3 13.2 13.1 13.1  HCT 45.9 41.0 40.7 40.8  MCV 93.9 90.3 90.4 90.5  PLT 245 224 227 255    Basic Metabolic Panel: Recent Labs  Lab 12/16/23 1047 12/17/23 0207 12/18/23 0518 12/19/23 0732  NA 139 137 136 135  K 4.3 3.9 4.1 4.1  CL 106 104 102 99  CO2 25 25 23 25   GLUCOSE 110* 125* 95 200*  BUN 15 14 15 20   CREATININE 1.28* 1.27* 1.34* 1.10  CALCIUM 10.1 9.4 9.8 9.7   GFR: Estimated Creatinine Clearance: 92.4 mL/min (by C-G formula based  on SCr of 1.1 mg/dL). Recent Labs  Lab 12/16/23 1047 12/17/23 0207 12/18/23 0518 12/19/23 0505  WBC 8.1 13.4* 9.7 9.5    Liver Function Tests: Recent Labs  Lab 12/17/23 0207  AST 17  ALT 13  ALKPHOS 49  BILITOT 1.2  PROT 8.2*  ALBUMIN 2.7*   No results for input(s): LIPASE, AMYLASE in the last 168 hours. No results for input(s): AMMONIA in the last 168 hours.  ABG    Component Value Date/Time   HCO3 31.7 (H) 02/04/2007 1335   TCO2 33 02/04/2007 1335     Coagulation Profile: No results for input(s): INR, PROTIME in the last 168 hours.  Cardiac Enzymes: Recent Labs  Lab 12/16/23 1304  CKTOTAL 56    HbA1C: Hgb A1c MFr Bld  Date/Time Value Ref Range Status  04/08/2019 03:53 PM 5.8 (H) 4.8 - 5.6 % Final    Comment:             Prediabetes: 5.7 - 6.4          Diabetes: >6.4          Glycemic control for adults with diabetes: <7.0   09/08/2018 12:31 PM 5.6 4.8 - 5.6 % Final    Comment:             Prediabetes: 5.7 - 6.4          Diabetes: >6.4          Glycemic control for adults with diabetes: <7.0     CBG: No results for input(s): GLUCAP in the last 168 hours.  Allergies No Known Allergies   Home Medications  Prior to Admission medications   Medication Sig Start Date End Date Taking? Authorizing Provider  albuterol (VENTOLIN HFA) 108 (90 Base) MCG/ACT inhaler Inhale 2 puffs into the lungs. 12/08/23 12/07/24 Yes [provider]  amoxicillin -clavulanate (AUGMENTIN ) 875-125 MG tablet Take 1 tablet by mouth every 12 (twelve) hours. 12/15/23 01/14/24 Yes Adrien Winfred Berke, MD  ibuprofen (ADVIL) 200 MG tablet Take 400 mg by mouth every 6 (six) hours as needed for moderate pain (pain score 4-6).   Yes [provider]  lisinopril  (ZESTRIL ) 20 MG tablet Take 30 mg by mouth daily. 03/26/21  Yes [provider]  oxyCODONE -acetaminophen  (PERCOCET) 5-325 MG tablet Take 1 tablet by mouth every 6 (six) hours as needed.  11/09/23  Yes Patt Alm Macho, MD     Critical care time: N/A     Lyle Pesa, NP [facility] 12/20/2023, 12:55 PM  See Amion for pager If no response to pager , please call [phone] until 7pm After 7:00 pm call Elink  [phone]

## 2023-12-21 ENCOUNTER — Inpatient Hospital Stay (HOSPITAL_COMMUNITY)

## 2023-12-21 DIAGNOSIS — Z8572 Personal history of non-Hodgkin lymphomas: Secondary | ICD-10-CM | POA: Diagnosis not present

## 2023-12-21 DIAGNOSIS — J948 Other specified pleural conditions: Secondary | ICD-10-CM | POA: Diagnosis not present

## 2023-12-21 DIAGNOSIS — R918 Other nonspecific abnormal finding of lung field: Secondary | ICD-10-CM | POA: Diagnosis not present

## 2023-12-21 DIAGNOSIS — J90 Pleural effusion, not elsewhere classified: Secondary | ICD-10-CM | POA: Diagnosis not present

## 2023-12-21 LAB — CBC
HCT: 40.9 % (ref 39.0–52.0)
Hemoglobin: 12.9 g/dL — ABNORMAL LOW (ref 13.0–17.0)
MCH: 28.5 pg (ref 26.0–34.0)
MCHC: 31.5 g/dL (ref 30.0–36.0)
MCV: 90.5 fL (ref 80.0–100.0)
Platelets: 302 K/uL (ref 150–400)
RBC: 4.52 MIL/uL (ref 4.22–5.81)
RDW: 12.4 % (ref 11.5–15.5)
WBC: 10.4 K/uL (ref 4.0–10.5)
nRBC: 0 % (ref 0.0–0.2)

## 2023-12-21 LAB — BASIC METABOLIC PANEL WITH GFR
Anion gap: 10 (ref 5–15)
BUN: 14 mg/dL (ref 8–23)
CO2: 26 mmol/L (ref 22–32)
Calcium: 9.4 mg/dL (ref 8.9–10.3)
Chloride: 104 mmol/L (ref 98–111)
Creatinine, Ser: 1.16 mg/dL (ref 0.61–1.24)
GFR, Estimated: >60 mL/min (ref >60)
Glucose, Bld: 113 mg/dL — ABNORMAL HIGH (ref 70–99)
Potassium: 3.9 mmol/L (ref 3.5–5.1)
Sodium: 138 mmol/L (ref 135–145)

## 2023-12-21 MED ORDER — ENOXAPARIN SODIUM 40 MG/0.4ML IJ SOSY
40.0000 mg | PREFILLED_SYRINGE | INTRAMUSCULAR | Status: DC
  Administered 2023-12-21 – 2023-12-25 (×5): 40 mg via SUBCUTANEOUS
  Filled 2023-12-21 (×5): qty 0.4

## 2023-12-21 MED ORDER — HYDROCORTISONE 1 % EX CREA
TOPICAL_CREAM | Freq: Two times a day (BID) | CUTANEOUS | Status: DC
  Administered 2023-12-23: 1 via TOPICAL
  Filled 2023-12-21 (×2): qty 28

## 2023-12-21 MED ORDER — IBUPROFEN 200 MG PO TABS
400.0000 mg | ORAL_TABLET | Freq: Four times a day (QID) | ORAL | Status: AC | PRN
  Administered 2023-12-22 (×2): 400 mg via ORAL
  Filled 2023-12-21 (×2): qty 2

## 2023-12-21 NOTE — Progress Notes (Signed)
 Patient's temperature was 102.3, and HR 124. Prn acetaminophen  was administered to this patient, will recheck his vital signs later. Provider is aware. Will continue to monitor.

## 2023-12-21 NOTE — Plan of Care (Signed)
  Problem: Activity: Goal: Risk for activity intolerance will decrease Outcome: Progressing   Problem: Nutrition: Goal: Adequate nutrition will be maintained Outcome: Progressing   Problem: Elimination: Goal: Will not experience complications related to bowel motility Outcome: Progressing Goal: Will not experience complications related to urinary retention Outcome: Progressing   Problem: Coping: Goal: Level of anxiety will decrease Outcome: Progressing

## 2023-12-21 NOTE — Progress Notes (Addendum)
 NAME:  Timothy Newton, MRN:  [id number], DOB:  Mar 13, 1960, LOS: 4 ADMISSION DATE:  12/16/2023, CONSULTATION DATE:  12/16/23 REFERRING MD:  EDP, CHIEF COMPLAINT:  SOB   History of Present Illness:  63 year old man w/ hx of MALT who was being followed by [facility] for a lung mass and effusion that acutely worsened and required thora in our ER back in early October, path showing inflammation.  He has since followed up with [facility] who ordered a PET/CT then referred the patient back to us  for additional tissue sampling.  He was seen in our pulmonary clinic and noted to be very dyspneic with even minimal exertion so instructed to come to ER today.    [facility] PET 12/01/23>  1. Hypermetabolic bilateral consolidation or neoplasm. The changes are increased compared to 10/28/2023. Consider bronchoscopy for further evaluation.  2. Large right effusion with one area of hypermetabolic activity posteriorly.  3. Hypermetabolic distal ileal mass. Possibly lymphoma  4. Hypermetabolic mesenteric nodes   Pertinent  Medical History   Past Medical History:  Diagnosis Date   Anemia    Bleeding gastric ulcer    Blood transfusion    H. 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 Initial pleural cytology from 5th negative for malignant cells. Cytology and fluid analysis sent. C/w exudative effusion.  11/13 bronchoscopy   Interim History / Subjective:  No distress  Objective    Blood pressure 125/81, pulse (!) 102, temperature 98.5 F (36.9 C), resp. rate 18, height 6' 2 (1.88 m), weight 111.1 kg, SpO2 97%.        Intake/Output Summary (Last 24 hours) at 12/21/2023 0813 Last data filed at 12/21/2023  0451 Gross per 24 hour  Intake 260 ml  Output 1375 ml  Net -1115 ml   Filed Weights   12/16/23 1031 12/18/23 0857  Weight: 108.9 kg 111.1 kg   Examination: General resting in bed no distress HENT NCAT no JVD Pulm dec right base. Right CT bloody appearing output. Marked at 200 cc over night Card rrr Abd soft Ext warm  Neuro intact  Resolved problem list   Assessment and Plan   Cavitary lung lesion  Right pleural effusion, recurrent & exudative  H/o MALT Lymphoma  Discussion  - Has known h/o MALT lymphoma.  Ipsilateral to a cavitary lesion from 12 years ago.  PET scan read from [facility] reviewed: does seem c/w a lymphoma type presentation although he has no B symptoms.  Odd presentation but at this point lymphoma related effusion highest on ddx; initial cytology was neg from pleural fluid but this is not uncommon & would not be sufficient to r/o malignancy.   - s/p CT placement 11/11 and bronchoscopy 11/13 - Aldolase 64, ANA neg, ANCA neg, antiscleroderma neg, RF neg, CCP neg, CRP 6.3, ESR 63, C3 205, C4 normal, CK normal.  -put out another from pleural space last evening   plan Continue pigtail to sxn given ongoing output  CT maintenance> flush q8hrs  Following pleural (11/11) and bronch (11/13) cytology.  If bronch cytology negative, will need colonoscopy  If Lymphoma confirmed will start systemic steroids If not lymphoma will cont CT drainage until output drops below 200cc/d for  2 consecutive days or consider attempting talc pleurodesis before pleur-x  If all path and cytology neg may need to consider colonoscopy to eval the ileal mass  Remainder per primary team.  Pulmonary will continue to follow.   Critical care time: N/A    My time 28 min included: review of most recent records, direct face to face time obtaining history, performing physical exam, developing and documenting plan as well as discussing this plan with the patient and/or care givers.  [id number]  on-going, stable,recovering or improving problem >25 min

## 2023-12-21 NOTE — Plan of Care (Signed)

## 2023-12-21 NOTE — Progress Notes (Signed)
 10 cc saline flush per order. Patient reported no discomfort. Suction return to -20 cm per order

## 2023-12-21 NOTE — Plan of Care (Signed)
  Problem: Education: Goal: Knowledge of General Education information will improve Description: Including pain rating scale, medication(s)/side effects and non-pharmacologic comfort measures 12/21/2023 0222 by Neda Rosaline LABOR, RN Outcome: Progressing 12/20/2023 2131 by Neda Rosaline LABOR, RN Outcome: Progressing   Problem: Health Behavior/Discharge Planning: Goal: Ability to manage health-related needs will improve 12/21/2023 0222 by Neda Rosaline LABOR, RN Outcome: Progressing 12/20/2023 2131 by Neda Rosaline LABOR, RN Outcome: Progressing   Problem: Clinical Measurements: Goal: Ability to maintain clinical measurements within normal limits will improve 12/21/2023 0222 by Neda Rosaline LABOR, RN Outcome: Progressing 12/20/2023 2131 by Neda Rosaline LABOR, RN Outcome: Progressing Goal: Will remain free from infection 12/21/2023 0222 by Neda Rosaline LABOR, RN Outcome: Progressing 12/20/2023 2131 by Neda Rosaline LABOR, RN Outcome: Progressing Goal: Diagnostic test results will improve 12/21/2023 0222 by Neda Rosaline LABOR, RN Outcome: Progressing 12/20/2023 2131 by Neda Rosaline LABOR, RN Outcome: Progressing Goal: Respiratory complications will improve 12/21/2023 0222 by Neda Rosaline LABOR, RN Outcome: Progressing 12/20/2023 2131 by Neda Rosaline LABOR, RN Outcome: Progressing Goal: Cardiovascular complication will be avoided 12/21/2023 0222 by Neda Rosaline LABOR, RN Outcome: Progressing 12/20/2023 2131 by Neda Rosaline LABOR, RN Outcome: Progressing   Problem: Activity: Goal: Risk for activity intolerance will decrease 12/21/2023 0222 by Neda Rosaline LABOR, RN Outcome: Progressing 12/20/2023 2131 by Neda Rosaline LABOR, RN Outcome: Progressing   Problem: Nutrition: Goal: Adequate nutrition will be maintained 12/21/2023 0222 by Neda Rosaline LABOR, RN Outcome: Progressing 12/20/2023 2131 by Neda Rosaline LABOR, RN Outcome: Progressing   Problem: Coping: Goal: Level of anxiety will  decrease 12/21/2023 0222 by Neda Rosaline LABOR, RN Outcome: Progressing 12/20/2023 2131 by Neda Rosaline LABOR, RN Outcome: Progressing   Problem: Elimination: Goal: Will not experience complications related to bowel motility 12/21/2023 0222 by Neda Rosaline LABOR, RN Outcome: Progressing 12/20/2023 2131 by Neda Rosaline LABOR, RN Outcome: Progressing Goal: Will not experience complications related to urinary retention 12/21/2023 0222 by Neda Rosaline LABOR, RN Outcome: Progressing 12/20/2023 2131 by Neda Rosaline LABOR, RN Outcome: Progressing   Problem: Pain Managment: Goal: General experience of comfort will improve and/or be controlled 12/21/2023 0222 by Neda Rosaline LABOR, RN Outcome: Progressing 12/20/2023 2131 by Neda Rosaline LABOR, RN Outcome: Progressing   Problem: Safety: Goal: Ability to remain free from injury will improve 12/21/2023 0222 by Neda Rosaline LABOR, RN Outcome: Progressing 12/20/2023 2131 by Neda Rosaline LABOR, RN Outcome: Progressing   Problem: Skin Integrity: Goal: Risk for impaired skin integrity will decrease 12/21/2023 0222 by Neda Rosaline LABOR, RN Outcome: Progressing 12/20/2023 2131 by Neda Rosaline LABOR, RN Outcome: Progressing

## 2023-12-21 NOTE — Progress Notes (Signed)
 PROGRESS NOTE    Timothy Newton  FMW:[id number] DOB: 10-Apr-1960 DOA: 12/16/2023 PCP: Dorcus Lamar POUR, MD    Brief Narrative:  Timothy Newton is a 63 y.o. 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   Patient has been undergoing workup for lung mass by pulmonology.  He was seen in the ED on 10/5 for shortness of breath and underwent 1 L thoracentesis at that time with elevated white cells and positive mesothelioma cells.  Pulmonology recommended outpatient follow-up.   Patient followed up today with pulmonology and is short of breath again with recurrent pleural effusion.  Recommended to come to the ED for chest tube placement, further workup, EBUS and biopsy.   Assessment and Plan: Lung masses Recurrent pleural effusion > Concern for lung nodules/masses and recurrent pleural effusions.  > Has undergone multiple thoracentesis with fluid analysis initially appearing nonmalignant, but with suspicion still high. > Sent by pulmonologist due to recurrent shortness of breath/reaccumulation of pleural effusion.  S/p chest tube which has been placed in the ED and for EBUS with biopsy on 11/13-- ? Lymphoma -pathology pending-- will call oncology in AM  Fever -resolved off abx   Hypertension - Continue home lisinopril    History of PUD History of MALT lymphoma - Noted   DVT prophylaxis: SCDs Start: 12/16/23 1304    Code Status: Full Code   Disposition Plan:  Level of care: Med-Surg Status is: Inpatient     Consultants:  PCCM   Subjective: Breathing well  Objective: Vitals:   12/20/23 1627 12/20/23 2011 12/21/23 0449 12/21/23 0809  BP: 109/69 109/64 119/82 125/81  Pulse: 90 88 80 (!) 102  Resp: 17 16 18 18   Temp: 98 F (36.7 C) 98.3 F (36.8 C) 98.6 F (37 C) 98.5 F (36.9 C)  TempSrc:   Oral   SpO2: 95% 94% 94% 97%  Weight:      Height:        Intake/Output Summary (Last 24 hours) at  12/21/2023 1131 Last data filed at 12/21/2023 1000 Gross per 24 hour  Intake 260 ml  Output 1500 ml  Net -1240 ml   Filed Weights   12/16/23 1031 12/18/23 0857  Weight: 108.9 kg 111.1 kg    Examination:   General: Appearance:    Obese male in no acute distress     Lungs:     respirations unlabored, chest tube in place  Heart:    Tachycardic.     MS:   All extremities are intact.    Neurologic:   Awake, alert       Data Reviewed: I have personally reviewed following labs and imaging studies  CBC: Recent Labs  Lab 12/16/23 1047 12/17/23 0207 12/18/23 0518 12/19/23 0505 12/21/23 1002  WBC 8.1 13.4* 9.7 9.5 10.4  HGB 14.3 13.2 13.1 13.1 12.9*  HCT 45.9 41.0 40.7 40.8 40.9  MCV 93.9 90.3 90.4 90.5 90.5  PLT 245 224 227 255 302   Basic Metabolic Panel: Recent Labs  Lab 12/16/23 1047 12/17/23 0207 12/18/23 0518 12/19/23 0732  NA 139 137 136 135  K 4.3 3.9 4.1 4.1  CL 106 104 102 99  CO2 25 25 23 25   GLUCOSE 110* 125* 95 200*  BUN 15 14 15 20   CREATININE 1.28* 1.27* 1.34* 1.10  CALCIUM 10.1 9.4 9.8 9.7   GFR: Estimated Creatinine Clearance: 92.4 mL/min (by C-G formula based on SCr of 1.1 mg/dL). Liver Function Tests: Recent Labs  Lab 12/17/23 0207  AST 17  ALT 13  ALKPHOS 49  BILITOT 1.2  PROT 8.2*  ALBUMIN 2.7*   No results for input(s): LIPASE, AMYLASE in the last 168 hours. No results for input(s): AMMONIA in the last 168 hours. Coagulation Profile: No results for input(s): INR, PROTIME in the last 168 hours. Cardiac Enzymes: Recent Labs  Lab 12/16/23 1304  CKTOTAL 56   BNP (last 3 results) No results for input(s): PROBNP in the last 8760 hours. HbA1C: No results for input(s): HGBA1C in the last 72 hours. CBG: No results for input(s): GLUCAP in the last 168 hours. Lipid Profile: No results for input(s): CHOL, HDL, LDLCALC, TRIG, CHOLHDL, LDLDIRECT in the last 72 hours. Thyroid Function Tests: No results  for input(s): TSH, T4TOTAL, FREET4, T3FREE, THYROIDAB in the last 72 hours. Anemia Panel: No results for input(s): VITAMINB12, FOLATE, FERRITIN, TIBC, IRON, RETICCTPCT in the last 72 hours.  Sepsis Labs: No results for input(s): PROCALCITON, LATICACIDVEN in the last 168 hours.  Recent Results (from the past 240 hours)  Body fluid culture w Gram Stain     Status: None   Collection Time: 12/16/23 12:58 PM   Specimen: Path fluid; Pleural Fluid  Result Value Ref Range Status   Specimen Description FLUID  Final   Special Requests NONE  Final   Gram Stain   Final    FEW WBC PRESENT, PREDOMINANTLY MONONUCLEAR NO ORGANISMS SEEN    Culture   Final    NO GROWTH 3 DAYS Performed at [facility], [address], [place], [place] [address]    Report Status 12/19/2023 FINAL  Final         Radiology Studies: DG CHEST PORT 1 VIEW Result Date: 12/19/2023 CLINICAL DATA:  Follow-up right chest tube and pleural effusion. EXAM: PORTABLE CHEST 1 VIEW COMPARISON:  12/17/2023 FINDINGS: Stable right basilar pigtail pleural catheter. Mildly improved aeration of the right lung with slightly less pleural fluid and consolidation. Clear left lung. Mild peribronchial thickening. Normal-sized heart. Stable right glenoid cystic areas. IMPRESSION: 1. Mildly improved aeration of the right lung with slightly less pleural fluid and consolidation. 2. Stable right basilar pigtail pleural catheter. 3. Mild bronchitic changes. Electronically Signed   By: Elspeth Bathe M.D.   On: 12/19/2023 18:18         Scheduled Meds:  lisinopril   30 mg Oral Daily   pneumococcal 20-valent conjugate vaccine  0.5 mL Intramuscular Tomorrow-1000   sodium chloride  flush  10 mL Intrapleural Q8H   sodium chloride  flush  10 mL Intrapleural Q8H   sodium chloride  flush  3 mL Intravenous Q12H   Continuous Infusions:     LOS: 4 days    Time spent: 45 minutes spent on chart review, discussion with  nursing staff, consultants, updating family and interview/physical exam; more than 50% of that time was spent in counseling and/or coordination of care.    [name], DO [facility] Available via Epic secure chat 7am-7pm After these hours, please refer to coverage provider listed on [email or web address] 12/21/2023, 11:31 AM

## 2023-12-22 DIAGNOSIS — R918 Other nonspecific abnormal finding of lung field: Secondary | ICD-10-CM | POA: Diagnosis not present

## 2023-12-22 DIAGNOSIS — Z8572 Personal history of non-Hodgkin lymphomas: Secondary | ICD-10-CM | POA: Diagnosis not present

## 2023-12-22 DIAGNOSIS — Z4682 Encounter for fitting and adjustment of non-vascular catheter: Secondary | ICD-10-CM

## 2023-12-22 DIAGNOSIS — J910 Malignant pleural effusion: Secondary | ICD-10-CM | POA: Diagnosis not present

## 2023-12-22 DIAGNOSIS — J984 Other disorders of lung: Secondary | ICD-10-CM | POA: Diagnosis not present

## 2023-12-22 DIAGNOSIS — C884 Extranodal marginal zone b-cell lymphoma of mucosa-associated lymphoid tissue (malt-lymphoma) not having achieved remission: Secondary | ICD-10-CM | POA: Diagnosis not present

## 2023-12-22 DIAGNOSIS — J90 Pleural effusion, not elsewhere classified: Secondary | ICD-10-CM | POA: Diagnosis not present

## 2023-12-22 LAB — BASIC METABOLIC PANEL WITH GFR
Anion gap: 12 (ref 5–15)
BUN: 17 mg/dL (ref 8–23)
CO2: 25 mmol/L (ref 22–32)
Calcium: 9.7 mg/dL (ref 8.9–10.3)
Chloride: 100 mmol/L (ref 98–111)
Creatinine, Ser: 1.37 mg/dL — ABNORMAL HIGH (ref 0.61–1.24)
GFR, Estimated: 58 mL/min — ABNORMAL LOW (ref >60)
Glucose, Bld: 134 mg/dL — ABNORMAL HIGH (ref 70–99)
Potassium: 4.2 mmol/L (ref 3.5–5.1)
Sodium: 137 mmol/L (ref 135–145)

## 2023-12-22 LAB — CBC
HCT: 39.6 % (ref 39.0–52.0)
Hemoglobin: 12.6 g/dL — ABNORMAL LOW (ref 13.0–17.0)
MCH: 28.8 pg (ref 26.0–34.0)
MCHC: 31.8 g/dL (ref 30.0–36.0)
MCV: 90.6 fL (ref 80.0–100.0)
Platelets: 311 K/uL (ref 150–400)
RBC: 4.37 MIL/uL (ref 4.22–5.81)
RDW: 12.5 % (ref 11.5–15.5)
WBC: 17.5 K/uL — ABNORMAL HIGH (ref 4.0–10.5)
nRBC: 0 % (ref 0.0–0.2)

## 2023-12-22 LAB — SURGICAL PATHOLOGY

## 2023-12-22 LAB — CYTOLOGY - NON PAP

## 2023-12-22 NOTE — Progress Notes (Signed)
 Preliminary path is lymphoma.  Final to result today. JV

## 2023-12-22 NOTE — Plan of Care (Signed)

## 2023-12-22 NOTE — Progress Notes (Signed)
 PROGRESS NOTE    Jusitn Newton  FMW:[id number] DOB: [birth year] DOA: 12/16/2023 PCP: Dorcus Lamar POUR, MD    Brief Narrative:  Timothy Newton is a 63 y.o. 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   Patient has been undergoing workup for lung mass by pulmonology.  He was seen in the ED on 10/5 for shortness of breath and underwent 1 L thoracentesis at that time with elevated white cells and positive mesothelioma cells.  Pulmonology recommended outpatient follow-up.   Patient followed up today with pulmonology and is short of breath again with recurrent pleural effusion.  Recommended to come to the ED for chest tube placement, further workup, EBUS and biopsy.  Preliminary pathology shows lymphoma-final to be back on 11/17   Assessment and Plan: Lung masses Recurrent pleural effusion > Concern for lung nodules/masses and recurrent pleural effusions.  > Has undergone multiple thoracentesis with fluid analysis initially appearing nonmalignant, but with suspicion still high. > Sent by pulmonologist due to recurrent shortness of breath/reaccumulation of pleural effusion.  S/p chest tube which has been placed in the ED and for EBUS with biopsy on 11/13-- ? Lymphoma -pathology to finalize on 11/17-oncology consulted and will see patient  Fever -mild elevated in WBC count -hold on abx for now -culture with no growth   Hypertension - Continue home lisinopril    History of PUD History of MALT lymphoma - Noted   DVT prophylaxis: enoxaparin (LOVENOX) injection 40 mg Start: 12/21/23 1315 SCDs Start: 12/16/23 1304    Code Status: Full Code   Disposition Plan:  Level of care: Med-Surg Status is: Inpatient     Consultants:  PCCM Oncology   Subjective: Amount of fluid from chest tube has decreased  Objective: Vitals:   12/21/23 2219 12/22/23 0033 12/22/23 0429 12/22/23 0822  BP:  108/73 112/64 122/73   Pulse:  (!) 110 92 85  Resp:  17 17   Temp: (!) 100.9 F (38.3 C) 100.2 F (37.9 C) 98.7 F (37.1 C) 98 F (36.7 C)  TempSrc: Oral  Oral Oral  SpO2:  91% 94% 96%  Weight:      Height:        Intake/Output Summary (Last 24 hours) at 12/22/2023 1207 Last data filed at 12/22/2023 9092 Gross per 24 hour  Intake 170 ml  Output 575 ml  Net -405 ml   Filed Weights   12/16/23 1031 12/18/23 0857  Weight: 108.9 kg 111.1 kg    Examination:   General: Appearance:    Obese male in no acute distress     Lungs:     respirations unlabored, chest tube in place  Heart:    Normal heart rate.     MS:   All extremities are intact.    Neurologic:   Awake, alert       Data Reviewed: I have personally reviewed following labs and imaging studies  CBC: Recent Labs  Lab 12/17/23 0207 12/18/23 0518 12/19/23 0505 12/21/23 1002 12/22/23 0435  WBC 13.4* 9.7 9.5 10.4 17.5*  HGB 13.2 13.1 13.1 12.9* 12.6*  HCT 41.0 40.7 40.8 40.9 39.6  MCV 90.3 90.4 90.5 90.5 90.6  PLT 224 227 255 302 311   Basic Metabolic Panel: Recent Labs  Lab 12/17/23 0207 12/18/23 0518 12/19/23 0732 12/21/23 1002 12/22/23 0435  NA 137 136 135 138 137  K 3.9 4.1 4.1 3.9 4.2  CL 104 102 99 104 100  CO2 25 23 25  26  25  GLUCOSE 125* 95 200* 113* 134*  BUN 14 15 20 14 17   CREATININE 1.27* 1.34* 1.10 1.16 1.37*  CALCIUM 9.4 9.8 9.7 9.4 9.7   GFR: Estimated Creatinine Clearance: 74.2 mL/min (A) (by C-G formula based on SCr of 1.37 mg/dL (H)). Liver Function Tests: Recent Labs  Lab 12/17/23 0207  AST 17  ALT 13  ALKPHOS 49  BILITOT 1.2  PROT 8.2*  ALBUMIN 2.7*   No results for input(s): LIPASE, AMYLASE in the last 168 hours. No results for input(s): AMMONIA in the last 168 hours. Coagulation Profile: No results for input(s): INR, PROTIME in the last 168 hours. Cardiac Enzymes: Recent Labs  Lab 12/16/23 1304  CKTOTAL 56   BNP (last 3 results) No results for input(s): PROBNP in  the last 8760 hours. HbA1C: No results for input(s): HGBA1C in the last 72 hours. CBG: No results for input(s): GLUCAP in the last 168 hours. Lipid Profile: No results for input(s): CHOL, HDL, LDLCALC, TRIG, CHOLHDL, LDLDIRECT in the last 72 hours. Thyroid Function Tests: No results for input(s): TSH, T4TOTAL, FREET4, T3FREE, THYROIDAB in the last 72 hours. Anemia Panel: No results for input(s): VITAMINB12, FOLATE, FERRITIN, TIBC, IRON, RETICCTPCT in the last 72 hours.  Sepsis Labs: No results for input(s): PROCALCITON, LATICACIDVEN in the last 168 hours.  Recent Results (from the past 240 hours)  Body fluid culture w Gram Stain     Status: None   Collection Time: 12/16/23 12:58 PM   Specimen: Path fluid; Pleural Fluid  Result Value Ref Range Status   Specimen Description FLUID  Final   Special Requests NONE  Final   Gram Stain   Final    FEW WBC PRESENT, PREDOMINANTLY MONONUCLEAR NO ORGANISMS SEEN    Culture   Final    NO GROWTH 3 DAYS Performed at [facility], [address], [place], [place] [address]    Report Status 12/19/2023 FINAL  Final         Radiology Studies: DG Chest Port 1 View Result Date: 12/21/2023 CLINICAL DATA:  Pleural effusion. EXAM: PORTABLE CHEST 1 VIEW COMPARISON:  Radiographs 12/19/2023 and 12/17/2023.  CT 12/17/2023. FINDINGS: 0904 hours. Right basilar pigtail catheter is unchanged in position. No significant change in the small right pleural effusion with possible pleural air-fluid levels of this erect examination. The underlying airspace disease throughout the right lung and in the left perihilar region has not significantly changed. New left-sided pleural effusion or pneumothorax. The heart size and mediastinal contours stable. IMPRESSION: 1. No significant change in small right pleural effusion with possible pleural air-fluid levels. Stable right basilar pigtail catheter. 2. Stable bilateral  airspace disease. Electronically Signed   By: Elsie Perone M.D.   On: 12/21/2023 13:12         Scheduled Meds:  enoxaparin (LOVENOX) injection  40 mg Subcutaneous Q24H   hydrocortisone cream   Topical BID   lisinopril   30 mg Oral Daily   pneumococcal 20-valent conjugate vaccine  0.5 mL Intramuscular Tomorrow-1000   sodium chloride  flush  10 mL Intrapleural Q8H   sodium chloride  flush  10 mL Intrapleural Q8H   sodium chloride  flush  3 mL Intravenous Q12H   Continuous Infusions:     LOS: 5 days    Time spent: 45 minutes spent on chart review, discussion with nursing staff, consultants, updating family and interview/physical exam; more than 50% of that time was spent in counseling and/or coordination of care.    Chales Pelissier U Grayland Daisey,  DO [facility] Available via Epic secure chat 7am-7pm After these hours, please refer to coverage provider listed on [email or web address] 12/22/2023, 12:07 PM

## 2023-12-22 NOTE — Progress Notes (Signed)
 NAME:  Timothy Newton, MRN:  [id number], DOB:  1960-10-20, LOS: 5 ADMISSION DATE:  12/16/2023, CONSULTATION DATE:  12/16/23 REFERRING MD:  EDP, CHIEF COMPLAINT:  SOB   History of Present Illness:  63 year old man w/ hx of MALT who was being followed by [facility] for a lung mass and effusion that acutely worsened and required thora in our ER back in early October, path showing inflammation.  He has since followed up with [facility] who ordered a PET/CT then referred the patient back to us  for additional tissue sampling.  He was seen in our pulmonary clinic and noted to be very dyspneic with even minimal exertion so instructed to come to ER today.    [facility] PET 12/01/23>  1. Hypermetabolic bilateral consolidation or neoplasm. The changes are increased compared to 10/28/2023. Consider bronchoscopy for further evaluation.  2. Large right effusion with one area of hypermetabolic activity posteriorly.  3. Hypermetabolic distal ileal mass. Possibly lymphoma  4. Hypermetabolic mesenteric nodes   Pertinent  Medical History   Past Medical History:  Diagnosis Date   Anemia    Bleeding gastric ulcer    Blood transfusion    H. 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 Initial pleural cytology from 5th negative for malignant cells. Cytology and fluid analysis sent. C/w exudative effusion.  11/13 bronchoscopy   Interim History / Subjective:  Minimal chest tube output today.   Objective    Blood pressure (!) 166/86, pulse 85, temperature (!) 101.7 F (38.7 C), temperature source Oral, resp. rate 17, height 6' 2 (1.88 m), weight 111.1 kg, SpO2 95%.        Intake/Output Summary (Last 24 hours) at  12/22/2023 1744 Last data filed at 12/22/2023 9092 Gross per 24 hour  Intake 150 ml  Output 575 ml  Net -425 ml   Filed Weights   12/16/23 1031 12/18/23 0857  Weight: 108.9 kg 111.1 kg   Examination: Elderly man lying in bed in [place] comfortably on [place], reduced R basilar breath sounds Chest tube not draining due to clots in distal tubing despite flushing tube.  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  Ipsilateral to a cavitary lesion from 12 years ago.  PET scan read from [facility] reviewed: does seem c/w a lymphoma type presentation although he has no B symptoms.  Cytologyt of lung nodule confirms B cell lymphoma - s/p CT placement 11/11 and bronchoscopy 11/13  Plan -chest tube atrium to be changed by RN due to occluded tubing -CXR tomorrow morning; may have trapped lung -discussed possible pleurX; need to discuss with oncology if this is likely to be rapidly responsive to chemo or not  Remainder per primary team.  Pulmonary will continue to follow.  Wife updated at bedside with patient during rounds.    Critical care time: N/A  Leita SHAUNNA Gaskins, DO 12/22/23 7:01 PM [facility]  For contact information, see Amion. If no response to pager, please call PCCM consult pager. After hours, 7PM- 7AM, please call Elink.

## 2023-12-22 NOTE — Progress Notes (Signed)
 Patient got a bath, his CT dressing was changed. Last temperature after intervention was 98.7.   His chest tube output since 1900 until now was zero ( 0 ). Will continue to monitor.

## 2023-12-22 NOTE — Progress Notes (Signed)
 Chest tube has been flush multiple times per order. No occlusion noted, tube flushing well. No output noted so far, collection chamber is empty since care was assumed. Dressing changed using  aseptic techniques. Patient denies any discomfort. Will continue to monitor.  Provider was notified.

## 2023-12-22 NOTE — Consult Note (Cosign Needed)
 [facility] CONSULT NOTE  Patient Care Team: Beam, Lamar POUR, MD as PCP - General (Family Medicine) Sheldon Standing, MD as Consulting Physician (General Surgery) Alvaro Ricardo KATHEE Raddle., MD as Consulting Physician (Urology) Pyrtle, Gordy HERO, MD as Consulting Physician (Gastroenterology)  CHIEF COMPLAINTS/PURPOSE OF CONSULTATION:  Lung mass.  History of MALT lymphoma  REFERRING PHYSICIAN: Dr. Juvenal  HISTORY OF PRESENTING ILLNESS:  Timothy Newton 63 y.o. male who came to the ED on 12/16/2023 with complaints of shortness of breath.  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  Subsequent path was positive for MALT lymphoma.  Therefore oncology evaluation has been requested. Patient is seen awake alert and oriented x 4 sitting up in bed.  Patient's son is also at bedside side.  Reports that he came to the ED 10/28/2023 because he felt like my lung had collapsed on the right side.  States his lung was drained but then the fluid came back right after.  He had been seeing pulmonary after that point.  He came to the ED from the pulmonary doctors office because his shortness of breath was increasing.  Denies chest pain or abdominal pain at this time.  Denies nausea, vomiting, diarrhea, constipation or other acute GI symptoms. Medical history as stated is significant for MALT lymphoma diagnosed in 2007.  States that he did not have chemotherapy at that time and has not followed up with oncology for several years. Surgical history is denied. Family oncologic history is denied. Social history includes tobacco use 1 pack/day x 5 years, reportedly quit over 20 years ago.  Denies alcohol use.  Denies recreational or illicit drug use.  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     I have reviewed his chart and materials related to his cancer extensively and collaborated history  with the patient. Summary of oncologic history is as follows: Oncology History   No history exists.    ASSESSMENT & PLAN:  Lung mass with enlarged mediastinal lymph nodes History of MALT lymphoma -Pathology done 12/16/2023 showed abnormal clonal B-cell population.  Cytology shows malignant cells present.  Pending final path. - Patient reports that in 2006 he was diagnosed with MALT lymphoma.  States he had no chemotherapy, immunotherapy or RT at that time.  Admits to no follow-up with oncology for several years. - Patient had appointment to see medical oncology/lung specialist at [facility] this week however due to hospitalization is being seen at this time. - Medical oncology/Dr. Pasam will make further evaluation and treatment recommendations  Pleural effusion, recurrent Shortness of breath - Imaging 12/17/2023 showed right pleural effusion - Status post thoracentesis.  Right-sided chest tube intact and draining serosanguineous fluid. - Pulmonary following  Anemia, mild - Hemoglobin 12.6 - Continue to monitor CBC with differential    MEDICAL HISTORY:  Past Medical History:  Diagnosis Date   Anemia    Bleeding gastric ulcer    Blood transfusion    H. 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   s/p 14 days Nexium, clarithromycin and amoxicillin     SURGICAL HISTORY: Past Surgical History:  Procedure Laterality Date   BRONCHIAL NEEDLE ASPIRATION BIOPSY  12/18/2023   Procedure: BRONCHOSCOPY, WITH NEEDLE ASPIRATION BIOPSY;  Surgeon: Claudene Toribio BROCKS, MD;  Location: [facility] ENDOSCOPY;  Service: Pulmonary;;   HYDROCELE EXCISION Bilateral  12/27/2022   Procedure: BILATERAL HYDROCELECTOMY ADULT;  Surgeon: Alvaro Ricardo KATHEE Mickey., MD;  Location: [facility] ORS;  Service: Urology;  Laterality: Bilateral;   ORCHIOPEXY Bilateral 12/27/2022   Procedure: BILATERAL ORCHIOPEXY ADULT;  Surgeon: Alvaro Ricardo KATHEE Mickey., MD;   Location: [facility] ORS;  Service: Urology;  Laterality: Bilateral;  120 MINS FOR CASE   UPPER GASTROINTESTINAL ENDOSCOPY  2006, 2013   VIDEO BRONCHOSCOPY WITH ENDOBRONCHIAL ULTRASOUND Right 12/18/2023   Procedure: BRONCHOSCOPY, WITH EBUS;  Surgeon: Claudene Toribio BROCKS, MD;  Location: [facility] ENDOSCOPY;  Service: Pulmonary;  Laterality: Right;    SOCIAL HISTORY: Social History   Socioeconomic History   Marital status: Married    Spouse name: Not on file   Number of children: 2   Years of education: Not on file   Highest education level: Not on file  Occupational History    Employer: [id number]  Tobacco Use   Smoking status: Former    Current packs/day: 0.00    Average packs/day: 1 pack/day for 5.0 years (5.0 ttl pk-yrs)    Types: Cigarettes    Start date: 03/18/1996    Quit date: 03/18/2001    Years since quitting: 22.7   Smokeless tobacco: Former  Building Services Engineer status: Never Used  Substance and Sexual Activity   Alcohol use: No   Drug use: No   Sexual activity: Yes  Other Topics Concern   Not on file  Social History Narrative   Not on file   Social Drivers of Health   Financial Resource Strain: Low Risk  (04/03/2023)   Received from [facility]   Overall Financial Resource Strain (CARDIA)    Difficulty of Paying Living Expenses: Not hard at all  Food Insecurity: No Food Insecurity (12/16/2023)   Hunger Vital Sign    Worried About Running Out of Food in the Last Year: Never true    Ran Out of Food in the Last Year: Never true  Transportation Needs: No Transportation Needs (12/16/2023)   PRAPARE - Administrator, Civil Service (Medical): No    Lack of Transportation (Non-Medical): No  Physical Activity: Sufficiently Active (01/24/2021)   Received from [facility]   Exercise Vital Sign    On average, how many days per week do you engage in moderate to strenuous exercise (like a brisk walk)?: 5 days    On average, how many minutes do you engage in exercise at this  level?: 60 min  Stress: No Stress Concern Present (01/24/2021)   Received from [facility] - Occupational Stress Questionnaire    Feeling of Stress : Not at all  Social Connections: Moderately Isolated (01/24/2021)   Received from [facility]   Social Connection and Isolation Panel    In a typical week, how many times do you talk on the phone with family, friends, or neighbors?: More than three times a week    How often do you get together with friends or relatives?: Never    How often do you attend church or religious services?: Never    Do you belong to any clubs or organizations such as church groups, unions, fraternal or athletic groups, or school groups?: Yes    How often do you attend meetings of the clubs or organizations you belong to?: Never    Are you married, widowed, divorced, separated, never married, or living with a partner?: Never married  Intimate Partner Violence: Not At Risk (12/16/2023)   Humiliation,  Afraid, Rape, and Kick questionnaire    Fear of Current or Ex-Partner: No    Emotionally Abused: No    Physically Abused: No    Sexually Abused: No    FAMILY HISTORY: Family History  Problem Relation Age of Onset   Colon cancer Neg Hx    Diabetes Neg Hx    Heart disease Neg Hx      PHYSICAL EXAMINATION: ECOG PERFORMANCE STATUS: 2 - Symptomatic, <50% confined to bed  Vitals:   12/22/23 0429 12/22/23 0822  BP: 112/64 122/73  Pulse: 92 85  Resp: 17   Temp: 98.7 F (37.1 C) 98 F (36.7 C)  SpO2: 94% 96%   Filed Weights   12/16/23 1031 12/18/23 0857  Weight: 240 lb (108.9 kg) 245 lb (111.1 kg)    GENERAL: alert, no distress and comfortable +R sided chest tube intact SKIN: skin color, texture, turgor are normal, no rashes or significant lesions EYES: normal, conjunctiva are pink and non-injected, sclera clear OROPHARYNX: no exudate, no erythema and lips, buccal mucosa, and tongue normal  NECK: supple,  thyroid normal size, non-tender, without nodularity LYMPH: no palpable lymphadenopathy in the cervical, axillary or inguinal LUNGS: +diminished to auscultation right worse than left  HEART: regular rate & rhythm and no murmurs and no lower extremity edema ABDOMEN: abdomen soft, non-tender and normal bowel sounds MUSCULOSKELETAL: no cyanosis of digits and no clubbing  PSYCH: alert & oriented x 3 with fluent speech NEURO: no focal motor/sensory deficits   ALLERGIES:  has no known allergies.  MEDICATIONS:  Current Facility-Administered Medications  Medication Dose Route Frequency Provider Last Rate Last Admin   acetaminophen  (TYLENOL ) tablet 650 mg  650 mg Oral Q6H PRN Melvin, Alexander B, MD   650 mg at 12/21/23 1954   Or   acetaminophen  (TYLENOL ) suppository 650 mg  650 mg Rectal Q6H PRN Seena Marsa NOVAK, MD       albuterol (PROVENTIL) (2.5 MG/3ML) 0.083% nebulizer solution 2.5 mg  2.5 mg Nebulization Q4H PRN Seena Marsa NOVAK, MD       enoxaparin (LOVENOX) injection 40 mg  40 mg Subcutaneous Q24H Vann, Jessica U, DO   40 mg at 12/21/23 1308   hydrocortisone cream 1 %   Topical BID Vann, Jessica U, DO   Given at 12/22/23 1019   ibuprofen (ADVIL) tablet 400 mg  400 mg Oral Q6H PRN Opyd, Timothy S, MD   400 mg at 12/22/23 0056   labetalol (NORMODYNE) injection 20 mg  20 mg Intravenous Q3H PRN Mansy, Jan A, MD       lisinopril  (ZESTRIL ) tablet 30 mg  30 mg Oral Daily Melvin, Alexander B, MD   30 mg at 12/22/23 1016   oxyCODONE -acetaminophen  (PERCOCET/ROXICET) 5-325 MG per tablet 1 tablet  1 tablet Oral Q6H PRN Seena Marsa NOVAK, MD   1 tablet at 12/19/23 1005   pneumococcal 20-valent conjugate vaccine (PREVNAR 20) injection 0.5 mL  0.5 mL Intramuscular Tomorrow-1000 Seena Marsa NOVAK, MD       polyethylene glycol (MIRALAX / GLYCOLAX) packet 17 g  17 g Oral Daily PRN Seena Marsa NOVAK, MD       sodium chloride  flush (NS) 0.9 % injection 10 mL  10 mL Intrapleural Q8H Claudene Toribio BROCKS,  MD   10 mL at 12/22/23 0407   sodium chloride  flush (NS) 0.9 % injection 10 mL  10 mL Intrapleural Q8H Gretta Doffing P, DO   10 mL at 12/22/23 0407   sodium chloride  flush (NS) 0.9 %  injection 3 mL  3 mL Intravenous Q12H Seena Marsa NOVAK, MD   3 mL at 12/22/23 1018   traZODone (DESYREL) tablet 25 mg  25 mg Oral QHS PRN Mansy, Jan A, MD   25 mg at 12/20/23 2118     LABORATORY DATA:  I have reviewed the data as listed Lab Results  Component Value Date   WBC 17.5 (H) 12/22/2023   HGB 12.6 (L) 12/22/2023   HCT 39.6 12/22/2023   MCV 90.6 12/22/2023   PLT 311 12/22/2023   Recent Labs    12/17/23 0207 12/18/23 0518 12/19/23 0732 12/21/23 1002 12/22/23 0435  NA 137   < > 135 138 137  K 3.9   < > 4.1 3.9 4.2  CL 104   < > 99 104 100  CO2 25   < > 25 26 25   GLUCOSE 125*   < > 200* 113* 134*  BUN 14   < > 20 14 17   CREATININE 1.27*   < > 1.10 1.16 1.37*  CALCIUM 9.4   < > 9.7 9.4 9.7  GFRNONAA >60   < > >60 >60 58*  PROT 8.2*  --   --   --   --   ALBUMIN 2.7*  --   --   --   --   AST 17  --   --   --   --   ALT 13  --   --   --   --   ALKPHOS 49  --   --   --   --   BILITOT 1.2  --   --   --   --    < > = values in this interval not displayed.    RADIOGRAPHIC STUDIES: I have personally reviewed the radiological images as listed and agreed with the findings in the report. DG Chest Port 1 View Result Date: 12/21/2023 CLINICAL DATA:  Pleural effusion. EXAM: PORTABLE CHEST 1 VIEW COMPARISON:  Radiographs 12/19/2023 and 12/17/2023.  CT 12/17/2023. FINDINGS: 0904 hours. Right basilar pigtail catheter is unchanged in position. No significant change in the small right pleural effusion with possible pleural air-fluid levels of this erect examination. The underlying airspace disease throughout the right lung and in the left perihilar region has not significantly changed. New left-sided pleural effusion or pneumothorax. The heart size and mediastinal contours stable. IMPRESSION: 1. No  significant change in small right pleural effusion with possible pleural air-fluid levels. Stable right basilar pigtail catheter. 2. Stable bilateral airspace disease. Electronically Signed   By: Elsie Perone M.D.   On: 12/21/2023 13:12   DG CHEST PORT 1 VIEW Result Date: 12/19/2023 CLINICAL DATA:  Follow-up right chest tube and pleural effusion. EXAM: PORTABLE CHEST 1 VIEW COMPARISON:  12/17/2023 FINDINGS: Stable right basilar pigtail pleural catheter. Mildly improved aeration of the right lung with slightly less pleural fluid and consolidation. Clear left lung. Mild peribronchial thickening. Normal-sized heart. Stable right glenoid cystic areas. IMPRESSION: 1. Mildly improved aeration of the right lung with slightly less pleural fluid and consolidation. 2. Stable right basilar pigtail pleural catheter. 3. Mild bronchitic changes. Electronically Signed   By: Elspeth Bathe M.D.   On: 12/19/2023 18:18   CT CHEST ABDOMEN PELVIS W CONTRAST Result Date: 12/17/2023 CLINICAL DATA:  Hematologic malignancy staging, history of MALT, lung mass with worsening effusion * Tracking Code: BO * EXAM: CT CHEST, ABDOMEN, AND PELVIS WITH CONTRAST TECHNIQUE: Multidetector CT imaging of the chest, abdomen and pelvis was performed  following the standard protocol during bolus administration of intravenous contrast. RADIATION DOSE REDUCTION: This exam was performed according to the departmental dose-optimization program which includes automated exposure control, adjustment of the mA and/or kV according to patient size and/or use of iterative reconstruction technique. CONTRAST:  75mL OMNIPAQUE  IOHEXOL  350 MG/ML SOLN COMPARISON:  CT chest angiogram, 11/09/2023, CT abdomen pelvis, 12/26/2022 FINDINGS: CT CHEST FINDINGS Cardiovascular: No significant vascular findings. Normal heart size. No pericardial effusion. Mediastinum/Nodes: Unchanged enlarged mediastinal lymph nodes, pretracheal nodes measuring up to 2.4 x 1.1 cm (series 2,  image 19). Thyroid gland, trachea, and esophagus demonstrate no significant findings. Lungs/Pleura: Diminished volume of a loculated right pleural effusion containing numerous small air loculations, pigtail catheter with formed pigtail in the dependent right pleural space (series 2, image 47). Very dense consolidation of the right upper lobe, not significantly changed, with additional very dense consolidation of the right middle lobe also not significantly changed. There is however somewhat improved aeration of the right lower lobe secondary to diminished effusion volume. Additional dense consolidation of the posterior suprahilar left upper lobe is not significantly changed, nor are numerous scattered irregular opacities throughout the remainder of the aerated portions of the lungs (series 4, image 40, 18). Trace left pleural effusion. Musculoskeletal: No chest wall abnormality. No acute osseous findings. CT ABDOMEN PELVIS FINDINGS Hepatobiliary: No solid liver abnormality is seen. Contracted gallbladder. No gallstones, gallbladder wall thickening, or biliary dilatation. Pancreas: Unremarkable. No pancreatic ductal dilatation or surrounding inflammatory changes. Spleen: Normal in size without significant abnormality. Adrenals/Urinary Tract: Adrenal glands are unremarkable. Kidneys are normal, without renal calculi, solid lesion, or hydronephrosis. Bladder is unremarkable. Stomach/Bowel: Stomach is within normal limits. Appendix appears normal. Similar circumferential mass of the terminal ileum measuring 6.1 x 3.5 x 3.3 cm (series 6, image 65, series 2, image 101). Pancolonic diverticulosis. Vascular/Lymphatic: No significant vascular findings are present. No enlarged abdominal or pelvic lymph nodes. Reproductive: Mild Prostatomegaly. Other: No abdominal wall hernia or abnormality. No ascites. Musculoskeletal: No acute osseous findings. IMPRESSION: 1. Diminished volume of a loculated right pleural effusion containing  numerous small air loculations, pigtail catheter with formed pigtail in the dependent right pleural space. 2. Very dense consolidation of the right upper lobe, not significantly changed, with additional very dense consolidation of the right middle lobe also not significantly changed. There is however somewhat improved aeration of the right lower lobe secondary to diminished effusion volume. 3. Additional dense consolidation of the posterior suprahilar left upper lobe is not significantly changed, nor are numerous scattered irregular opacities throughout the remainder of the aerated portions of the lungs. 4. Constellation of pulmonary findings is suggestive of pulmonary lymphomatous involvement especially inasmuch as right middle lobe consolidation was present on a relatively remote CT examination of the abdomen pelvis dated 2024. 5. Unchanged enlarged mediastinal lymph nodes. 6. 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 7. Pancolonic diverticulosis without evidence of acute diverticulitis. Electronically Signed   By: Marolyn JONETTA Jaksch M.D.   On: 12/17/2023 13:16   DG Chest Port 1 View Result Date: 12/17/2023 EXAM: 1 VIEW(S) XRAY OF THE CHEST 12/17/2023 06:52:00 AM COMPARISON: AP radiograph of the chest dated 12/16/2023. CLINICAL HISTORY: [id number] Chest tube in place [id number] Chest tube in place FINDINGS: LINES, TUBES AND DEVICES: A pigtail catheter is again seen projecting over the right lung base. LUNGS AND PLEURA: Interval improved aeration of the right lung, particularly the right mid lung zone. A right-sided  pleural effusion has likely decreased in the interim. The left lung appears clear. No focal pulmonary opacity. No pulmonary edema. No pneumothorax. HEART AND MEDIASTINUM: No acute abnormality of the cardiac and mediastinal silhouettes. BONES AND SOFT TISSUES: No acute osseous abnormality. IMPRESSION: 1. Interval improved aeration of the  right lung, particularly the right mid lung zone. 2. Likely decreased right-sided pleural effusion. 3. Pigtail catheter in place over the right lung base. Electronically signed by: Evalene Coho MD 12/17/2023 06:57 AM EST RP Workstation: [id number]   DG Chest 2 View Result Date: 12/16/2023 EXAM: 2 VIEW(S) XRAY OF THE CHEST 12/16/2023 01:55:00 PM COMPARISON: 11/09/2023 CLINICAL HISTORY: shortness of breath FINDINGS: LUNGS AND PLEURA: Complete opacification of right hemithorax, significantly worsened since prior study. Persistent masslike airspace opacity in the left suprahilar region. No left pleural effusion. No pneumothorax. HEART AND MEDIASTINUM: No acute abnormality of the cardiac and mediastinal silhouettes. BONES AND SOFT TISSUES: No acute osseous abnormality. IMPRESSION: 1. Complete opacification of the right hemithorax, significantly worsened since the prior study, probably representing a combination of large effusion and compressive atelectasis versus airspace disease. 2. Similarly appearing masslike opacity in the left suprahilar region. Electronically signed by: Rogelia Myers MD 12/16/2023 02:18 PM EST RP Workstation: [id number]   DG Chest Port 1 View Result Date: 12/16/2023 EXAM: 1 VIEW(S) XRAY OF THE CHEST 12/16/2023 01:16:18 PM COMPARISON: 11/09/2023 CLINICAL HISTORY: Chest tube in place. FINDINGS: LINES, TUBES AND DEVICES: Right chest tube with tip terminating along the right lung base. LUNGS AND PLEURA: Persistent opacification of right hemithorax, despite the chest tube. Known left perihilar mass. No pulmonary edema. No pneumothorax. HEART AND MEDIASTINUM: Atherosclerotic calcifications. No acute abnormality of the cardiac and mediastinal silhouettes. BONES AND SOFT TISSUES: No acute osseous abnormality. IMPRESSION: 1. Persistent opacification of the right hemithorax despite the chest tube. 2. Persistent left perihilar density Electronically signed by: Maude Stammer MD 12/16/2023 01:43  PM EST RP Workstation: [id number]     The total time spent in the appointment was 40 minutes encounter with patients including review of chart and various tests results, discussions about plan of care and coordination of care plan   All questions were answered. The patient knows to call the clinic with any problems, questions or concerns. No barriers to learning was detected.  Olam PARAS Angellee Cohill, NP 11/17/20252:14 PM

## 2023-12-22 NOTE — Progress Notes (Signed)
 MD asked for pt Chest tube set up to be changed due to clotting. This nurse and charge nurse changed the sahara chest tube set up. Chest tube was flushed several times with no resistance. Sahara set back up to water seal with no air bubbles. Chest tube set back up to suctioning. No new drainage in sahara at this time.

## 2023-12-23 ENCOUNTER — Inpatient Hospital Stay (HOSPITAL_COMMUNITY)

## 2023-12-23 ENCOUNTER — Other Ambulatory Visit: Payer: Self-pay | Admitting: Oncology

## 2023-12-23 DIAGNOSIS — Z8572 Personal history of non-Hodgkin lymphomas: Secondary | ICD-10-CM | POA: Diagnosis not present

## 2023-12-23 DIAGNOSIS — C884 Extranodal marginal zone b-cell lymphoma of mucosa-associated lymphoid tissue (malt-lymphoma) not having achieved remission: Secondary | ICD-10-CM | POA: Diagnosis not present

## 2023-12-23 DIAGNOSIS — J984 Other disorders of lung: Secondary | ICD-10-CM | POA: Diagnosis not present

## 2023-12-23 DIAGNOSIS — R918 Other nonspecific abnormal finding of lung field: Secondary | ICD-10-CM | POA: Diagnosis not present

## 2023-12-23 DIAGNOSIS — J90 Pleural effusion, not elsewhere classified: Secondary | ICD-10-CM | POA: Diagnosis not present

## 2023-12-23 LAB — HEPATITIS B SURFACE ANTIGEN: Hepatitis B Surface Ag: NONREACTIVE

## 2023-12-23 NOTE — Progress Notes (Signed)
 PROGRESS NOTE    Timothy Newton  FMW:[id number] DOB: 02/09/1960 DOA: 12/16/2023 PCP: Dorcus Lamar POUR, MD    Brief Narrative:  Timothy Newton is a 63 y.o. 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   Patient has been undergoing workup for lung mass by pulmonology.  He was seen in the ED on 10/5 for shortness of breath and underwent 1 L thoracentesis at that time with elevated white cells and positive mesothelioma cells.  Pulmonology recommended outpatient follow-up.   Patient followed up today with pulmonology and is short of breath again with recurrent pleural effusion.  Recommended to come to the ED for chest tube placement, further workup, EBUS and biopsy.  Preliminary pathology shows lymphoma.   Assessment and Plan: Lung masses Recurrent pleural effusion > Concern for lung nodules/masses and recurrent pleural effusions.  > Has undergone multiple thoracentesis with fluid analysis initially appearing nonmalignant, but with suspicion still high. > Sent by pulmonologist due to recurrent shortness of breath/reaccumulation of pleural effusion.  S/p chest tube which has been placed in the ED and for EBUS with biopsy on 11/13-- ? Lymphoma -oncology consulted  Fever -mild elevated in WBC count -hold on abx for now -culture with no growth   Hypertension - Continue home lisinopril    History of PUD History of MALT lymphoma - Noted   DVT prophylaxis: enoxaparin (LOVENOX) injection 40 mg Start: 12/21/23 1315 SCDs Start: 12/16/23 1304    Code Status: Full Code   Disposition Plan:  Level of care: Med-Surg Status is: Inpatient     Consultants:  PCCM Oncology   Subjective: RN reports NO fluid from the chest tube  Objective: Vitals:   12/23/23 0013 12/23/23 0418 12/23/23 0522 12/23/23 0728  BP: 130/63  125/69 137/76  Pulse: (!) 104  (!) 107 (!) 109  Resp: 20 18 20 18   Temp: 100.2 F (37.9 C)  99.7 F (37.6 C) 100 F (37.8 C) 98 F (36.7 C)  TempSrc: Oral Oral Oral   SpO2: 93%  93% 94%  Weight:      Height:        Intake/Output Summary (Last 24 hours) at 12/23/2023 1140 Last data filed at 12/23/2023 0420 Gross per 24 hour  Intake 143 ml  Output 300 ml  Net -157 ml   Filed Weights   12/16/23 1031 12/18/23 0857  Weight: 108.9 kg 111.1 kg    Examination:   General: Appearance:    Obese male in no acute distress     Lungs:     respirations unlabored, chest tube in place  Heart:    Tachycardic.     MS:   All extremities are intact.    Neurologic:   Awake, alert       Data Reviewed: I have personally reviewed following labs and imaging studies  CBC: Recent Labs  Lab 12/17/23 0207 12/18/23 0518 12/19/23 0505 12/21/23 1002 12/22/23 0435  WBC 13.4* 9.7 9.5 10.4 17.5*  HGB 13.2 13.1 13.1 12.9* 12.6*  HCT 41.0 40.7 40.8 40.9 39.6  MCV 90.3 90.4 90.5 90.5 90.6  PLT 224 227 255 302 311   Basic Metabolic Panel: Recent Labs  Lab 12/17/23 0207 12/18/23 0518 12/19/23 0732 12/21/23 1002 12/22/23 0435  NA 137 136 135 138 137  K 3.9 4.1 4.1 3.9 4.2  CL 104 102 99 104 100  CO2 25 23 25 26 25   GLUCOSE 125* 95 200* 113* 134*  BUN 14 15 20  14  17  CREATININE 1.27* 1.34* 1.10 1.16 1.37*  CALCIUM 9.4 9.8 9.7 9.4 9.7   GFR: Estimated Creatinine Clearance: 74.2 mL/min (A) (by C-G formula based on SCr of 1.37 mg/dL (H)). Liver Function Tests: Recent Labs  Lab 12/17/23 0207  AST 17  ALT 13  ALKPHOS 49  BILITOT 1.2  PROT 8.2*  ALBUMIN 2.7*   No results for input(s): LIPASE, AMYLASE in the last 168 hours. No results for input(s): AMMONIA in the last 168 hours. Coagulation Profile: No results for input(s): INR, PROTIME in the last 168 hours. Cardiac Enzymes: Recent Labs  Lab 12/16/23 1304  CKTOTAL 56   BNP (last 3 results) No results for input(s): PROBNP in the last 8760 hours. HbA1C: No results for input(s): HGBA1C in the last 72  hours. CBG: No results for input(s): GLUCAP in the last 168 hours. Lipid Profile: No results for input(s): CHOL, HDL, LDLCALC, TRIG, CHOLHDL, LDLDIRECT in the last 72 hours. Thyroid Function Tests: No results for input(s): TSH, T4TOTAL, FREET4, T3FREE, THYROIDAB in the last 72 hours. Anemia Panel: No results for input(s): VITAMINB12, FOLATE, FERRITIN, TIBC, IRON, RETICCTPCT in the last 72 hours.  Sepsis Labs: No results for input(s): PROCALCITON, LATICACIDVEN in the last 168 hours.  Recent Results (from the past 240 hours)  Body fluid culture w Gram Stain     Status: None   Collection Time: 12/16/23 12:58 PM   Specimen: Path fluid; Pleural Fluid  Result Value Ref Range Status   Specimen Description FLUID  Final   Special Requests NONE  Final   Gram Stain   Final    FEW WBC PRESENT, PREDOMINANTLY MONONUCLEAR NO ORGANISMS SEEN    Culture   Final    NO GROWTH 3 DAYS Performed at [facility], [address] [place], [place] [address]    Report Status 12/19/2023 FINAL  Final         Radiology Studies: No results found.        Scheduled Meds:  enoxaparin (LOVENOX) injection  40 mg Subcutaneous Q24H   hydrocortisone cream   Topical BID   lisinopril   30 mg Oral Daily   pneumococcal 20-valent conjugate vaccine  0.5 mL Intramuscular Tomorrow-1000   sodium chloride  flush  10 mL Intrapleural Q8H   sodium chloride  flush  10 mL Intrapleural Q8H   sodium chloride  flush  3 mL Intravenous Q12H   Continuous Infusions:     LOS: 6 days    Time spent: 45 minutes spent on chart review, discussion with nursing staff, consultants, updating family and interview/physical exam; more than 50% of that time was spent in counseling and/or coordination of care.    [name], DO [facility] Available via Epic secure chat 7am-7pm After these hours, please refer to coverage provider listed on [email or web address] 12/23/2023, 11:40 AM

## 2023-12-23 NOTE — Progress Notes (Signed)
 [facility]  HEMATOLOGY/ONCOLOGY IN-PATIENT PROGRESS NOTE   PATIENT NAME: Timothy Newton   MR#: [id number] DOB: [birth year] CSN#: [id number]   DATE OF SERVICE: 12/23/2023  ASSESSMENT & PLAN:   MALT Lymphoma - Please review oncology history for additional details and timeline of events. - On 12/18/2023, Dr. Toribio Sharps performed flexible bronchoscopy, EBUS and biopsy.  He had unique anatomy and had to undergo transbronchial biopsy by EBUS along apical wall of right upper lobe.  Pathology from FNA from right upper lobe lung nodule/lymph node came back positive for malignant cells.  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  This is consistent with patient's history of MALT lymphoma.  Hence we were consulted for additional recommendations.   - Reviewed CT chest abdomen pelvis from 12/17/2023.  It showed diminished volume of a loculated right pleural effusion containing numerous small air loculations, pigtail catheter with formed pigtail in the dependent right pleural space. Very dense consolidation of the right upper lobe, not significantly changed, with additional very dense consolidation of the right middle lobe also not significantly changed. There is however somewhat improved aeration of the right lower lobe secondary to diminished effusion volume. Additional dense consolidation of the posterior suprahilar left upper lobe is not significantly changed, nor are numerous scattered irregular opacities throughout the remainder of the aerated portions of the lungs. Constellation of pulmonary findings is suggestive of pulmonary lymphomatous involvement especially inasmuch as right middle lobe consolidation was present on a relatively remote CT examination of the abdomen pelvis dated 2024. Unchanged enlarged mediastinal lymph nodes. Similar circumferential mass of the terminal ileum measuring 6.1 x 3.5 x 3.3 cm, in keeping with  MALT lymphoma. Pancolonic diverticulosis without evidence of acute diverticulitis.   - Discussed developments so far with the patient.  Clinical picture is consistent with  MALT lymphoma, which was previously untreated but now is causing complications including driving pleural effusions.  This would warrant treatment initiation.  Typically treatments are given with single agent rituximab.   - Since patient remains symptomatic from pleural effusion and it seems to arise from MALT lymphoma, inpatient chemotherapy can be initiated based on this indication.  - Plan to start him on Rituximab from 12/24/2023.  Discussed with inpatient chemotherapy committee and it has been approved.  We will check hepatitis B core antibody and hepatitis B surface antibody prior to rituximab initiation.  We will not wait for results though, since he does not have risk factors for hepatitis B infection.  -Will monitor tumor lysis labs at least once daily.  Rest of the management is as per primary team.  Will continue to follow.  Please call us  with any questions or concerns.   [name], MD 12/23/2023 5:10 PM  ONCOLOGY HISTORY:  He has been following up with his PCP at [facility]  He presented to an urgent care facility and September 2025 with exertional shortness of breath/bronchospasm.  Chest x-ray at that time showed right upper lobe lung mass.   He has remote history of MALT lymphoma in 2006, for which he was on surveillance only.  Given chest x-ray findings of right upper lobe lung mass, CT chest with contrast was obtained by his PCP and on 10/28/2023 showed large right-sided pleural effusion, multifocal areas of consolidation in the right lung including a masslike area in the right apex concerning for malignancy.  Other areas were more suggestive of consolidation and/or atelectasis.  Multifocal smaller opacities in the left lung.  Findings concerning for neoplasm or infection.  Small mediastinal lymph nodes.   Prominent right diaphragmatic lymph node.  With these findings, he was referred to pulmonology.  On review of records, he has had right upper lobe lung nodule since 2013 and was lost to follow-up.  He was seen by Dr. Javaid on 11/03/2023 and plan made for thoracentesis and PET scan.   On 11/09/2023, he presented to [facility] ED and Dr. Toribio Sharps with pulmonology performed thoracentesis.  Pleural fluid cytology showed no malignant cells.  Showed findings consistent with chronic inflammation.   On 12/01/2023, staging PET scan at [facility] showed hypermetabolic bilateral consolidation or neoplasm. The changes are increased compared to 10/28/2023. Consider bronchoscopy for further evaluation. Large right effusion with one area of hypermetabolic activity posteriorly. Hypermetabolic distal ileal mass. Possibly lymphoma Hypermetabolic mesenteric nodes.    Since clinical picture remained concerning for malignancy, additional workup was planned in the outpatient setting.  Patient was seen in pulmonology clinic by Dr. Tamala Patch on 12/15/2023.  Since he did not have significant improvement after previous thoracentesis, recommended ER evaluation for admission/further management including chest tube placement.    On 12/16/2023, he underwent pleural catheter insertion.  On 12/18/2023, Dr. Toribio Sharps performed flexible bronchoscopy, EBUS and biopsy.  He had unique anatomy and had to undergo transbronchial biopsy by EBUS along apical wall of right upper lobe.  Pathology from FNA from right upper lobe lung nodule/lymph node came back positive for malignant cells.  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  This is consistent with patient's history of MALT lymphoma.  Hence we were consulted for additional recommendations.   Reviewed CT chest abdomen pelvis from 12/17/2023.  It showed diminished volume of a loculated right pleural effusion containing  numerous small air loculations, pigtail catheter with formed pigtail in the dependent right pleural space. Very dense consolidation of the right upper lobe, not significantly changed, with additional very dense consolidation of the right middle lobe also not significantly changed. There is however somewhat improved aeration of the right lower lobe secondary to diminished effusion volume. Additional dense consolidation of the posterior suprahilar left upper lobe is not significantly changed, nor are numerous scattered irregular opacities throughout the remainder of the aerated portions of the lungs. Constellation of pulmonary findings is suggestive of pulmonary lymphomatous involvement especially inasmuch as right middle lobe consolidation was present on a relatively remote CT examination of the abdomen pelvis dated 2024. Unchanged enlarged mediastinal lymph nodes. 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 Pancolonic diverticulosis without evidence of acute diverticulitis.   Discussed developments so far with the patient.  Clinical picture is consistent with  MALT lymphoma, which was previously untreated but now is causing complications including driving pleural effusions.  This would warrant treatment initiation.  Typically treatments are given with single agent rituximab.   Since patient remains symptomatic from pleural effusion and it seems to arise from MALT lymphoma, inpatient chemotherapy can be initiated based on this indication.  Plan to start him on Rituximab from 12/24/2023.   SUBJECTIVE:   Patient seen and evaluated.  Still has shortness of breath.  Since there was no fluid from the chest tube, it was removed earlier today.  Denies any fevers or chills.  Denies any abdominal pain, bowel disturbances or blood in stools.  OBJECTIVE:  Vitals:   12/23/23 0728 12/23/23 1201  BP: 137/76 125/69  Pulse: ROLLEN)  109 (!) 106  Resp:  18 18  Temp: 98 F (36.7 C) 99.5 F (37.5 C)  SpO2: 94% 95%     Intake/Output Summary (Last 24 hours) at 12/23/2023 1710 Last data filed at 12/23/2023 0420 Gross per 24 hour  Intake 143 ml  Output 300 ml  Net -157 ml    Physical Exam Constitutional:      General: He is not in acute distress.    Appearance: Normal appearance.  HENT:     Head: Normocephalic and atraumatic.  Eyes:     Conjunctiva/sclera: Conjunctivae normal.  Cardiovascular:     Rate and Rhythm: Regular rhythm. Tachycardia present.  Pulmonary:     Effort: Pulmonary effort is normal.     Comments: Diminished breath sounds on the right side Abdominal:     General: There is no distension.  Neurological:     General: No focal deficit present.     Mental Status: He is alert and oriented to person, place, and time.  Psychiatric:        Mood and Affect: Mood normal.        Behavior: Behavior normal.     LABS:   No results found for this or any previous visit (from the past 24 hours).   IMAGING STUDIES:   DG CHEST PORT 1 VIEW Result Date: 12/23/2023 EXAM: 1 VIEW(S) XRAY OF THE CHEST _study_datetime_ COMPARISON: 12/21/2023 CLINICAL HISTORY: Chest tube in place. FINDINGS: LINES, TUBES AND DEVICES: Right sided pigtail catheter is unchanged in position. LUNGS AND PLEURA: Increased pleural parenchymal opacification throughout the majority of the right hemithorax. Pleural air-fluid levels are less distinct today. Chronic interstitial thickening without left sided pulmonary opacity. HEART AND MEDIASTINUM: No acute abnormality of the cardiac and mediastinal silhouettes. BONES AND SOFT TISSUES: No acute osseous abnormality. IMPRESSION: 1. Worsened right sided aeration, likely due to a combination of pleural fluid, possible pleural air, and airspace disease. 2. Right sided pleural pigtail catheter remains in place. Electronically signed by: Rockey Kilts MD 12/23/2023 12:05 PM EST RP Workstation: [id number]   DG Chest Port  1 View Result Date: 12/21/2023 CLINICAL DATA:  Pleural effusion. EXAM: PORTABLE CHEST 1 VIEW COMPARISON:  Radiographs 12/19/2023 and 12/17/2023.  CT 12/17/2023. FINDINGS: 0904 hours. Right basilar pigtail catheter is unchanged in position. No significant change in the small right pleural effusion with possible pleural air-fluid levels of this erect examination. The underlying airspace disease throughout the right lung and in the left perihilar region has not significantly changed. New left-sided pleural effusion or pneumothorax. The heart size and mediastinal contours stable. IMPRESSION: 1. No significant change in small right pleural effusion with possible pleural air-fluid levels. Stable right basilar pigtail catheter. 2. Stable bilateral airspace disease. Electronically Signed   By: Elsie Perone M.D.   On: 12/21/2023 13:12   DG CHEST PORT 1 VIEW Result Date: 12/19/2023 CLINICAL DATA:  Follow-up right chest tube and pleural effusion. EXAM: PORTABLE CHEST 1 VIEW COMPARISON:  12/17/2023 FINDINGS: Stable right basilar pigtail pleural catheter. Mildly improved aeration of the right lung with slightly less pleural fluid and consolidation. Clear left lung. Mild peribronchial thickening. Normal-sized heart. Stable right glenoid cystic areas. IMPRESSION: 1. Mildly improved aeration of the right lung with slightly less pleural fluid and consolidation. 2. Stable right basilar pigtail pleural catheter. 3. Mild bronchitic changes. Electronically Signed   By: Elspeth Bathe M.D.   On: 12/19/2023 18:18   CT CHEST ABDOMEN PELVIS W CONTRAST Result Date: 12/17/2023 CLINICAL DATA:  Hematologic malignancy staging,  history of MALT, lung mass with worsening effusion * Tracking Code: BO * EXAM: CT CHEST, ABDOMEN, AND PELVIS WITH CONTRAST TECHNIQUE: Multidetector CT imaging of the chest, abdomen and pelvis was performed following the standard protocol during bolus administration of intravenous contrast. RADIATION DOSE  REDUCTION: This exam was performed according to the departmental dose-optimization program which includes automated exposure control, adjustment of the mA and/or kV according to patient size and/or use of iterative reconstruction technique. CONTRAST:  75mL OMNIPAQUE  IOHEXOL  350 MG/ML SOLN COMPARISON:  CT chest angiogram, 11/09/2023, CT abdomen pelvis, 12/26/2022 FINDINGS: CT CHEST FINDINGS Cardiovascular: No significant vascular findings. Normal heart size. No pericardial effusion. Mediastinum/Nodes: Unchanged enlarged mediastinal lymph nodes, pretracheal nodes measuring up to 2.4 x 1.1 cm (series 2, image 19). Thyroid gland, trachea, and esophagus demonstrate no significant findings. Lungs/Pleura: Diminished volume of a loculated right pleural effusion containing numerous small air loculations, pigtail catheter with formed pigtail in the dependent right pleural space (series 2, image 47). Very dense consolidation of the right upper lobe, not significantly changed, with additional very dense consolidation of the right middle lobe also not significantly changed. There is however somewhat improved aeration of the right lower lobe secondary to diminished effusion volume. Additional dense consolidation of the posterior suprahilar left upper lobe is not significantly changed, nor are numerous scattered irregular opacities throughout the remainder of the aerated portions of the lungs (series 4, image 40, 18). Trace left pleural effusion. Musculoskeletal: No chest wall abnormality. No acute osseous findings. CT ABDOMEN PELVIS FINDINGS Hepatobiliary: No solid liver abnormality is seen. Contracted gallbladder. No gallstones, gallbladder wall thickening, or biliary dilatation. Pancreas: Unremarkable. No pancreatic ductal dilatation or surrounding inflammatory changes. Spleen: Normal in size without significant abnormality. Adrenals/Urinary Tract: Adrenal glands are unremarkable. Kidneys are normal, without renal calculi,  solid lesion, or hydronephrosis. Bladder is unremarkable. Stomach/Bowel: Stomach is within normal limits. Appendix appears normal. Similar circumferential mass of the terminal ileum measuring 6.1 x 3.5 x 3.3 cm (series 6, image 65, series 2, image 101). Pancolonic diverticulosis. Vascular/Lymphatic: No significant vascular findings are present. No enlarged abdominal or pelvic lymph nodes. Reproductive: Mild Prostatomegaly. Other: No abdominal wall hernia or abnormality. No ascites. Musculoskeletal: No acute osseous findings. IMPRESSION: 1. Diminished volume of a loculated right pleural effusion containing numerous small air loculations, pigtail catheter with formed pigtail in the dependent right pleural space. 2. Very dense consolidation of the right upper lobe, not significantly changed, with additional very dense consolidation of the right middle lobe also not significantly changed. There is however somewhat improved aeration of the right lower lobe secondary to diminished effusion volume. 3. Additional dense consolidation of the posterior suprahilar left upper lobe is not significantly changed, nor are numerous scattered irregular opacities throughout the remainder of the aerated portions of the lungs. 4. Constellation of pulmonary findings is suggestive of pulmonary lymphomatous involvement especially inasmuch as right middle lobe consolidation was present on a relatively remote CT examination of the abdomen pelvis dated 2024. 5. Unchanged enlarged mediastinal lymph nodes. 6. 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 7. Pancolonic diverticulosis without evidence of acute diverticulitis. Electronically Signed   By: Marolyn JONETTA Jaksch M.D.   On: 12/17/2023 13:16   DG Chest Port 1 View Result Date: 12/17/2023 EXAM: 1 VIEW(S) XRAY OF THE CHEST 12/17/2023 06:52:00 AM COMPARISON: AP radiograph of the chest dated 12/16/2023. CLINICAL HISTORY: [id number]  Chest tube in place [id number] Chest tube in place FINDINGS:  LINES, TUBES AND DEVICES: A pigtail catheter is again seen projecting over the right lung base. LUNGS AND PLEURA: Interval improved aeration of the right lung, particularly the right mid lung zone. A right-sided pleural effusion has likely decreased in the interim. The left lung appears clear. No focal pulmonary opacity. No pulmonary edema. No pneumothorax. HEART AND MEDIASTINUM: No acute abnormality of the cardiac and mediastinal silhouettes. BONES AND SOFT TISSUES: No acute osseous abnormality. IMPRESSION: 1. Interval improved aeration of the right lung, particularly the right mid lung zone. 2. Likely decreased right-sided pleural effusion. 3. Pigtail catheter in place over the right lung base. Electronically signed by: Evalene Coho MD 12/17/2023 06:57 AM EST RP Workstation: [id number]   DG Chest 2 View Result Date: 12/16/2023 EXAM: 2 VIEW(S) XRAY OF THE CHEST 12/16/2023 01:55:00 PM COMPARISON: 11/09/2023 CLINICAL HISTORY: shortness of breath FINDINGS: LUNGS AND PLEURA: Complete opacification of right hemithorax, significantly worsened since prior study. Persistent masslike airspace opacity in the left suprahilar region. No left pleural effusion. No pneumothorax. HEART AND MEDIASTINUM: No acute abnormality of the cardiac and mediastinal silhouettes. BONES AND SOFT TISSUES: No acute osseous abnormality. IMPRESSION: 1. Complete opacification of the right hemithorax, significantly worsened since the prior study, probably representing a combination of large effusion and compressive atelectasis versus airspace disease. 2. Similarly appearing masslike opacity in the left suprahilar region. Electronically signed by: Rogelia Myers MD 12/16/2023 02:18 PM EST RP Workstation: [id number]   DG Chest Port 1 View Result Date: 12/16/2023 EXAM: 1 VIEW(S) XRAY OF THE CHEST 12/16/2023 01:16:18 PM COMPARISON: 11/09/2023 CLINICAL HISTORY: Chest tube in place.  FINDINGS: LINES, TUBES AND DEVICES: Right chest tube with tip terminating along the right lung base. LUNGS AND PLEURA: Persistent opacification of right hemithorax, despite the chest tube. Known left perihilar mass. No pulmonary edema. No pneumothorax. HEART AND MEDIASTINUM: Atherosclerotic calcifications. No acute abnormality of the cardiac and mediastinal silhouettes. BONES AND SOFT TISSUES: No acute osseous abnormality. IMPRESSION: 1. Persistent opacification of the right hemithorax despite the chest tube. 2. Persistent left perihilar density Electronically signed by: Maude Stammer MD 12/16/2023 01:43 PM EST RP Workstation: [id number]

## 2023-12-23 NOTE — Progress Notes (Signed)
 NAME:  Timothy Newton, MRN:  [id number], DOB:  Oct 15, 1960, LOS: 6 ADMISSION DATE:  12/16/2023, CONSULTATION DATE:  12/16/23 REFERRING MD:  EDP, CHIEF COMPLAINT:  SOB   History of Present Illness:  63 year old man w/ hx of MALT who was being followed by [facility] for a lung mass and effusion that acutely worsened and required thora in our ER back in early October, path showing inflammation.  He has since followed up with [facility] who ordered a PET/CT then referred the patient back to us  for additional tissue sampling.  He was seen in our pulmonary clinic and noted to be very dyspneic with even minimal exertion so instructed to come to ER today.    [facility] PET 12/01/23>  1. Hypermetabolic bilateral consolidation or neoplasm. The changes are increased compared to 10/28/2023. Consider bronchoscopy for further evaluation.  2. Large right effusion with one area of hypermetabolic activity posteriorly.  3. Hypermetabolic distal ileal mass. Possibly lymphoma  4. Hypermetabolic mesenteric nodes   Pertinent  Medical History   Past Medical History:  Diagnosis Date   Anemia    Bleeding gastric ulcer    Blood transfusion    H. 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 Initial pleural cytology from 5th negative for malignant cells. Cytology and fluid analysis sent. C/w exudative effusion.  11/13 bronchoscopy   Interim History / Subjective:  Denies complaints. No chest tube output after atrium changed.    Objective    Blood pressure 125/69, pulse (!) 106, temperature 99.5 F (37.5 C), temperature source Oral, resp. rate 18, height 6' 2 (1.88 m), weight 111.1 kg, SpO2 95%.        Intake/Output  Summary (Last 24 hours) at 12/23/2023 1604 Last data filed at 12/23/2023 0420 Gross per 24 hour  Intake 143 ml  Output 300 ml  Net -157 ml   Filed Weights   12/16/23 1031 12/18/23 0857  Weight: 108.9 kg 111.1 kg   Examination: Middle aged walking with walker  [place]/AT, eyes anicteric Breathing comfortably on RA, no chest tube output Awake, alert, moving all extremities.     Ct output: 0cc  Cytology RUL nodule - B cell lymphoma   CXR 11/18 personally reviewed> unchanged- central R lung clearing, straight horizontal line with opacity below in RLL, RUL opacity. Chest tube dependent R hemithorax.  Comapred to CT from last week- similar appearance.   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  Ipsilateral to a cavitary lesion from 12 years ago.  PET scan read from [facility] reviewed: does seem c/w a lymphoma type presentation although he has no B symptoms.  Cytologyt of lung nodule confirms B cell lymphoma - s/p CT placement 11/11 and bronchoscopy 11/13  Plan -chest tube d/c today with LPN at bedside -Needs OP follow up CT ina few weeks to see if he reaccumulates fluid; sooner if he has symptoms. CXR not helpful in his case.  -discussed possible pleurX if fluid reaccumulates -Onc planning for rituximab, hopefully first dose here  Remainder per primary  team.  Pulmonary will follow up OP-- messaged primary pulmonologist to update her. PCCM will be available as needed.   Wife updated at bedside with patient during rounds.    Critical care time: N/A     Leita SHAUNNA Gaskins, DO 12/23/23 5:08 PM [facility]  For contact information, see Amion. If no response to pager, please call PCCM consult pager. After hours, 7PM- 7AM, please call Elink.

## 2023-12-24 DIAGNOSIS — Z4682 Encounter for fitting and adjustment of non-vascular catheter: Secondary | ICD-10-CM | POA: Diagnosis not present

## 2023-12-24 DIAGNOSIS — J910 Malignant pleural effusion: Secondary | ICD-10-CM | POA: Diagnosis not present

## 2023-12-24 DIAGNOSIS — C884 Extranodal marginal zone b-cell lymphoma of mucosa-associated lymphoid tissue (malt-lymphoma) not having achieved remission: Secondary | ICD-10-CM | POA: Diagnosis not present

## 2023-12-24 DIAGNOSIS — R918 Other nonspecific abnormal finding of lung field: Secondary | ICD-10-CM | POA: Diagnosis not present

## 2023-12-24 LAB — CBC WITH DIFFERENTIAL/PLATELET
Abs Immature Granulocytes: 0.1 K/uL — ABNORMAL HIGH (ref 0.00–0.07)
Basophils Absolute: 0.1 K/uL (ref 0.0–0.1)
Basophils Relative: 0 %
Eosinophils Absolute: 0.1 K/uL (ref 0.0–0.5)
Eosinophils Relative: 0 %
HCT: 37.2 % — ABNORMAL LOW (ref 39.0–52.0)
Hemoglobin: 11.7 g/dL — ABNORMAL LOW (ref 13.0–17.0)
Immature Granulocytes: 1 %
Lymphocytes Relative: 14 %
Lymphs Abs: 2.1 K/uL (ref 0.7–4.0)
MCH: 28.7 pg (ref 26.0–34.0)
MCHC: 31.5 g/dL (ref 30.0–36.0)
MCV: 91.4 fL (ref 80.0–100.0)
Monocytes Absolute: 1.7 K/uL — ABNORMAL HIGH (ref 0.1–1.0)
Monocytes Relative: 11 %
Neutro Abs: 10.9 K/uL — ABNORMAL HIGH (ref 1.7–7.7)
Neutrophils Relative %: 74 %
Platelets: 326 K/uL (ref 150–400)
RBC: 4.07 MIL/uL — ABNORMAL LOW (ref 4.22–5.81)
RDW: 12.9 % (ref 11.5–15.5)
WBC: 14.8 K/uL — ABNORMAL HIGH (ref 4.0–10.5)
nRBC: 0 % (ref 0.0–0.2)

## 2023-12-24 LAB — BASIC METABOLIC PANEL WITH GFR
Anion gap: 13 (ref 5–15)
BUN: 13 mg/dL (ref 8–23)
CO2: 24 mmol/L (ref 22–32)
Calcium: 9.9 mg/dL (ref 8.9–10.3)
Chloride: 100 mmol/L (ref 98–111)
Creatinine, Ser: 1.41 mg/dL — ABNORMAL HIGH (ref 0.61–1.24)
GFR, Estimated: 56 mL/min — ABNORMAL LOW (ref >60)
Glucose, Bld: 131 mg/dL — ABNORMAL HIGH (ref 70–99)
Potassium: 4.6 mmol/L (ref 3.5–5.1)
Sodium: 137 mmol/L (ref 135–145)

## 2023-12-24 LAB — URIC ACID: Uric Acid, Serum: 6.7 mg/dL (ref 3.7–8.6)

## 2023-12-24 LAB — PHOSPHORUS: Phosphorus: 3.7 mg/dL (ref 2.5–4.6)

## 2023-12-24 LAB — MAGNESIUM: Magnesium: 2.3 mg/dL (ref 1.7–2.4)

## 2023-12-24 LAB — HEPATITIS B CORE ANTIBODY, TOTAL: HEP B CORE AB: NEGATIVE

## 2023-12-24 LAB — LACTATE DEHYDROGENASE: LDH: 113 U/L (ref 105–235)

## 2023-12-24 MED ORDER — SODIUM CHLORIDE 0.9 % IV SOLN: Freq: Once | INTRAVENOUS | Status: DC | PRN

## 2023-12-24 MED ORDER — DIPHENHYDRAMINE HCL 25 MG PO CAPS
50.0000 mg | ORAL_CAPSULE | Freq: Once | ORAL | Status: AC
  Administered 2023-12-24: 50 mg via ORAL
  Filled 2023-12-24: qty 2

## 2023-12-24 MED ORDER — SODIUM CHLORIDE 0.9 % IV SOLN
375.0000 mg/m2 | Freq: Once | INTRAVENOUS | Status: AC
  Administered 2023-12-24: 900 mg via INTRAVENOUS
  Filled 2023-12-24: qty 50

## 2023-12-24 MED ORDER — ACETAMINOPHEN 325 MG PO TABS
650.0000 mg | ORAL_TABLET | Freq: Once | ORAL | Status: AC
  Administered 2023-12-24: 650 mg via ORAL
  Filled 2023-12-24: qty 2

## 2023-12-24 MED ORDER — ALBUTEROL SULFATE HFA 108 (90 BASE) MCG/ACT IN AERS
2.0000 | INHALATION_SPRAY | Freq: Once | RESPIRATORY_TRACT | Status: AC | PRN
  Administered 2023-12-24: 2 via RESPIRATORY_TRACT

## 2023-12-24 MED ORDER — MORPHINE SULFATE (PF) 2 MG/ML IV SOLN
INTRAVENOUS | Status: AC
  Administered 2023-12-24: 2 mg via INTRAVENOUS
  Filled 2023-12-24: qty 1

## 2023-12-24 MED ORDER — METHYLPREDNISOLONE SODIUM SUCC 125 MG IJ SOLR
125.0000 mg | Freq: Once | INTRAMUSCULAR | Status: AC | PRN
  Administered 2023-12-24: 125 mg via INTRAVENOUS

## 2023-12-24 MED ORDER — DIPHENHYDRAMINE HCL 50 MG/ML IJ SOLN
50.0000 mg | Freq: Once | INTRAMUSCULAR | Status: AC | PRN
  Administered 2023-12-24: 25 mg via INTRAVENOUS

## 2023-12-24 MED ORDER — MORPHINE SULFATE (PF) 2 MG/ML IV SOLN
2.0000 mg | Freq: Once | INTRAVENOUS | Status: AC
  Administered 2023-12-24: 2 mg via INTRAVENOUS

## 2023-12-24 MED ORDER — FAMOTIDINE IN NACL 20-0.9 MG/50ML-% IV SOLN
20.0000 mg | Freq: Once | INTRAVENOUS | Status: AC | PRN
  Administered 2023-12-24: 20 mg via INTRAVENOUS

## 2023-12-24 MED ORDER — MORPHINE SULFATE (PF) 2 MG/ML IV SOLN: 2.0000 mg | Freq: Once | INTRAVENOUS | Status: AC

## 2023-12-24 MED ORDER — MORPHINE SULFATE (PF) 2 MG/ML IV SOLN
INTRAVENOUS | Status: AC
  Filled 2023-12-24: qty 1

## 2023-12-24 NOTE — Progress Notes (Addendum)
 ADDENDUM:  Patient was personally and independently interviewed, examined and relevant elements of the history of present illness were reviewed in details and an assessment and plan was created. All elements of the patient's history of present illness, assessment and plan were discussed in detail with Olam JINNY Brunner, NP. The above documentation reflects our combined findings assessment and plan.   Briefly, 63 year old gentleman with history of MALT lymphoma, originally diagnosed in 2006, now with disease progression with pleural effusions.  He was started on systemic treatment with rituximab  from today, 12/24/2023.  He had infusion reaction with rigors, which responded to extra allergy medicines and morphine .  We will check tumor lysis labs tomorrow.  If he is clinically stable otherwise, he can be discharged home and will continue treatments in the outpatient setting.  Subjectively he is feeling better.  Please call us  with any questions.  Thank you.   Timothy Newton   DOB:[birth year]   FM#:[id number]      ASSESSMENT & PLAN:  Timothy Newton 63 y.o. male who came to the ED on 12/16/2023 with complaints of shortness of breath.  Previously seen by pulmonary for same and had a thoracentesis which was positive for mesothelial cells.  Subsequent path was positive for MALT lymphoma.  Chemotherapy initiated 12/24/23.  Medical Oncology following.   MALT lymphoma Lung mass with enlarged mediastinal lymph nodes - Patient reports that in 2006 he was diagnosed with MALT lymphoma.  States he had no chemotherapy, immunotherapy or RT at that time.  Admits to no follow-up with oncology for several years. -- Seen by PCP and Pulm prior to this admission.  Pet scan done 10/27 at [facility] showed hypermetabolic bilateral consolidation or neoplasm.   - He had bronch with biopsy 12/16/2023. Path showed abnormal clonal B-cell population.  Cytology shows malignant cells present.  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 -- Clinical picture is consistent with  MALT lymphoma, which was previously untreated but now is causing complications including driving pleural effusions.  This would warrant treatment initiation.  Typically treatments are given with single agent rituximab .  -- Initiated Rituximab  regimen 12/24/23. Patient had infusional reaction w/rigors necessitating additional pre-medication.  Rechallenged with rituximab  and seen tolerating well.   - Medical oncology/Dr. [place] Gipe will make further evaluation and treatment recommendations   Pleural effusion, recurrent Shortness of breath - Stable -- Imaging 12/17/2023 showed right pleural effusion - Status post thoracentesis and status post chest tube, d/c 11/18.  - Pulmonary following   Anemia, mild, likely related to lymphoma and acute illness - Hemoglobin stable 11.7  -- No transfusional intervention required. - Continue to monitor CBC with differential  Renal dysfunction -- Elevated creatinine and slightly decreased GFR -- Avoid nephrotoxic agents -- Continue to monitor renal function   Code Status Full   Subjective:  Patient seen awake and alert laying in bed. IV Rituximab  infusing well with Infusion nurse at bedside.  Reports infusional reaction/rigors earlier on necessitating additional pre-medications.  Patient responded well and seen comfortable.  No acute complaints offered. No acute distress noted.   Objective:   Intake/Output Summary (Last 24 hours) at 12/24/2023 1331 Last data filed at 12/24/2023 0800 Gross per 24 hour  Intake 10 ml  Output 825 ml  Net -815 ml     PHYSICAL EXAMINATION: ECOG PERFORMANCE STATUS: 2 - Symptomatic, <50% confined to bed  Vitals:   12/24/23 1138 12/24/23 1158  BP: 136/78 124/73  Pulse: (!) 126 (!) 120  Resp:  Temp:    SpO2: 94% 94%   Filed Weights   12/16/23 1031 12/18/23 0857  Weight: 240 lb (108.9 kg) 245 lb (111.1 kg)     GENERAL: alert, no distress and comfortable SKIN: skin color, texture, turgor are normal, no rashes or significant lesions EYES: normal, conjunctiva are pink and non-injected, sclera clear OROPHARYNX: no exudate, no erythema and lips, buccal mucosa, and tongue normal  NECK: supple, thyroid normal size, non-tender, without nodularity LYMPH: no palpable lymphadenopathy in the cervical, axillary or inguinal LUNGS: clear to auscultation and percussion with normal breathing effort HEART: regular rate & rhythm and no murmurs and no lower extremity edema ABDOMEN: abdomen soft, non-tender and normal bowel sounds MUSCULOSKELETAL: no cyanosis of digits and no clubbing  PSYCH: alert & oriented x 3 with fluent speech NEURO: no focal motor/sensory deficits   All questions were answered. The patient knows to call the clinic with any problems, questions or concerns.   The total time spent in the appointment was 40 minutes encounter with patient including review of chart and various tests results, discussions about plan of care and coordination of care plan  Olam JINNY Brunner, NP 12/24/2023 1:31 PM    Labs Reviewed:  Lab Results  Component Value Date   WBC 14.8 (H) 12/24/2023   HGB 11.7 (L) 12/24/2023   HCT 37.2 (L) 12/24/2023   MCV 91.4 12/24/2023   PLT 326 12/24/2023   Recent Labs    12/17/23 0207 12/18/23 0518 12/21/23 1002 12/22/23 0435 12/24/23 0152  NA 137   < > 138 137 137  K 3.9   < > 3.9 4.2 4.6  CL 104   < > 104 100 100  CO2 25   < > 26 25 24   GLUCOSE 125*   < > 113* 134* 131*  BUN 14   < > 14 17 13   CREATININE 1.27*   < > 1.16 1.37* 1.41*  CALCIUM 9.4   < > 9.4 9.7 9.9  GFRNONAA >60   < > >60 58* 56*  PROT 8.2*  --   --   --   --   ALBUMIN 2.7*  --   --   --   --   AST 17  --   --   --   --   ALT 13  --   --   --   --   ALKPHOS 49  --   --   --   --   BILITOT 1.2  --   --   --   --    < > = values in this interval not displayed.    Studies Reviewed:  DG CHEST  PORT 1 VIEW Result Date: 12/23/2023 EXAM: 1 VIEW(S) XRAY OF THE CHEST _study_datetime_ COMPARISON: 12/21/2023 CLINICAL HISTORY: Chest tube in place. FINDINGS: LINES, TUBES AND DEVICES: Right sided pigtail catheter is unchanged in position. LUNGS AND PLEURA: Increased pleural parenchymal opacification throughout the majority of the right hemithorax. Pleural air-fluid levels are less distinct today. Chronic interstitial thickening without left sided pulmonary opacity. HEART AND MEDIASTINUM: No acute abnormality of the cardiac and mediastinal silhouettes. BONES AND SOFT TISSUES: No acute osseous abnormality. IMPRESSION: 1. Worsened right sided aeration, likely due to a combination of pleural fluid, possible pleural air, and airspace disease. 2. Right sided pleural pigtail catheter remains in place. Electronically signed by: Rockey Kilts MD 12/23/2023 12:05 PM EST RP Workstation: [id number]   DG Chest Port 1 View Result Date: 12/21/2023 CLINICAL DATA:  Pleural  effusion. EXAM: PORTABLE CHEST 1 VIEW COMPARISON:  Radiographs 12/19/2023 and 12/17/2023.  CT 12/17/2023. FINDINGS: 0904 hours. Right basilar pigtail catheter is unchanged in position. No significant change in the small right pleural effusion with possible pleural air-fluid levels of this erect examination. The underlying airspace disease throughout the right lung and in the left perihilar region has not significantly changed. New left-sided pleural effusion or pneumothorax. The heart size and mediastinal contours stable. IMPRESSION: 1. No significant change in small right pleural effusion with possible pleural air-fluid levels. Stable right basilar pigtail catheter. 2. Stable bilateral airspace disease. Electronically Signed   By: Elsie Perone M.D.   On: 12/21/2023 13:12   DG CHEST PORT 1 VIEW Result Date: 12/19/2023 CLINICAL DATA:  Follow-up right chest tube and pleural effusion. EXAM: PORTABLE CHEST 1 VIEW COMPARISON:  12/17/2023 FINDINGS: Stable  right basilar pigtail pleural catheter. Mildly improved aeration of the right lung with slightly less pleural fluid and consolidation. Clear left lung. Mild peribronchial thickening. Normal-sized heart. Stable right glenoid cystic areas. IMPRESSION: 1. Mildly improved aeration of the right lung with slightly less pleural fluid and consolidation. 2. Stable right basilar pigtail pleural catheter. 3. Mild bronchitic changes. Electronically Signed   By: Elspeth Bathe M.D.   On: 12/19/2023 18:18   CT CHEST ABDOMEN PELVIS W CONTRAST Result Date: 12/17/2023 CLINICAL DATA:  Hematologic malignancy staging, history of MALT, lung mass with worsening effusion * Tracking Code: BO * EXAM: CT CHEST, ABDOMEN, AND PELVIS WITH CONTRAST TECHNIQUE: Multidetector CT imaging of the chest, abdomen and pelvis was performed following the standard protocol during bolus administration of intravenous contrast. RADIATION DOSE REDUCTION: This exam was performed according to the departmental dose-optimization program which includes automated exposure control, adjustment of the mA and/or kV according to patient size and/or use of iterative reconstruction technique. CONTRAST:  75mL OMNIPAQUE  IOHEXOL  350 MG/ML SOLN COMPARISON:  CT chest angiogram, 11/09/2023, CT abdomen pelvis, 12/26/2022 FINDINGS: CT CHEST FINDINGS Cardiovascular: No significant vascular findings. Normal heart size. No pericardial effusion. Mediastinum/Nodes: Unchanged enlarged mediastinal lymph nodes, pretracheal nodes measuring up to 2.4 x 1.1 cm (series 2, image 19). Thyroid gland, trachea, and esophagus demonstrate no significant findings. Lungs/Pleura: Diminished volume of a loculated right pleural effusion containing numerous small air loculations, pigtail catheter with formed pigtail in the dependent right pleural space (series 2, image 47). Very dense consolidation of the right upper lobe, not significantly changed, with additional very dense consolidation of the right  middle lobe also not significantly changed. There is however somewhat improved aeration of the right lower lobe secondary to diminished effusion volume. Additional dense consolidation of the posterior suprahilar left upper lobe is not significantly changed, nor are numerous scattered irregular opacities throughout the remainder of the aerated portions of the lungs (series 4, image 40, 18). Trace left pleural effusion. Musculoskeletal: No chest wall abnormality. No acute osseous findings. CT ABDOMEN PELVIS FINDINGS Hepatobiliary: No solid liver abnormality is seen. Contracted gallbladder. No gallstones, gallbladder wall thickening, or biliary dilatation. Pancreas: Unremarkable. No pancreatic ductal dilatation or surrounding inflammatory changes. Spleen: Normal in size without significant abnormality. Adrenals/Urinary Tract: Adrenal glands are unremarkable. Kidneys are normal, without renal calculi, solid lesion, or hydronephrosis. Bladder is unremarkable. Stomach/Bowel: Stomach is within normal limits. Appendix appears normal. Similar circumferential mass of the terminal ileum measuring 6.1 x 3.5 x 3.3 cm (series 6, image 65, series 2, image 101). Pancolonic diverticulosis. Vascular/Lymphatic: No significant vascular findings are present. No enlarged abdominal or pelvic lymph nodes. Reproductive: Mild Prostatomegaly. Other:  No abdominal wall hernia or abnormality. No ascites. Musculoskeletal: No acute osseous findings. IMPRESSION: 1. Diminished volume of a loculated right pleural effusion containing numerous small air loculations, pigtail catheter with formed pigtail in the dependent right pleural space. 2. Very dense consolidation of the right upper lobe, not significantly changed, with additional very dense consolidation of the right middle lobe also not significantly changed. There is however somewhat improved aeration of the right lower lobe secondary to diminished effusion volume. 3. Additional dense  consolidation of the posterior suprahilar left upper lobe is not significantly changed, nor are numerous scattered irregular opacities throughout the remainder of the aerated portions of the lungs. 4. Constellation of pulmonary findings is suggestive of pulmonary lymphomatous involvement especially inasmuch as right middle lobe consolidation was present on a relatively remote CT examination of the abdomen pelvis dated 2024. 5. Unchanged enlarged mediastinal lymph nodes. 6. 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 7. Pancolonic diverticulosis without evidence of acute diverticulitis. Electronically Signed   By: Marolyn JONETTA Jaksch M.D.   On: 12/17/2023 13:16   DG Chest Port 1 View Result Date: 12/17/2023 EXAM: 1 VIEW(S) XRAY OF THE CHEST 12/17/2023 06:52:00 AM COMPARISON: AP radiograph of the chest dated 12/16/2023. CLINICAL HISTORY: [id number] Chest tube in place [id number] Chest tube in place FINDINGS: LINES, TUBES AND DEVICES: A pigtail catheter is again seen projecting over the right lung base. LUNGS AND PLEURA: Interval improved aeration of the right lung, particularly the right mid lung zone. A right-sided pleural effusion has likely decreased in the interim. The left lung appears clear. No focal pulmonary opacity. No pulmonary edema. No pneumothorax. HEART AND MEDIASTINUM: No acute abnormality of the cardiac and mediastinal silhouettes. BONES AND SOFT TISSUES: No acute osseous abnormality. IMPRESSION: 1. Interval improved aeration of the right lung, particularly the right mid lung zone. 2. Likely decreased right-sided pleural effusion. 3. Pigtail catheter in place over the right lung base. Electronically signed by: Evalene Coho MD 12/17/2023 06:57 AM EST RP Workstation: [id number]   DG Chest 2 View Result Date: 12/16/2023 EXAM: 2 VIEW(S) XRAY OF THE CHEST 12/16/2023 01:55:00 PM COMPARISON: 11/09/2023 CLINICAL HISTORY: shortness of breath  FINDINGS: LUNGS AND PLEURA: Complete opacification of right hemithorax, significantly worsened since prior study. Persistent masslike airspace opacity in the left suprahilar region. No left pleural effusion. No pneumothorax. HEART AND MEDIASTINUM: No acute abnormality of the cardiac and mediastinal silhouettes. BONES AND SOFT TISSUES: No acute osseous abnormality. IMPRESSION: 1. Complete opacification of the right hemithorax, significantly worsened since the prior study, probably representing a combination of large effusion and compressive atelectasis versus airspace disease. 2. Similarly appearing masslike opacity in the left suprahilar region. Electronically signed by: Rogelia Myers MD 12/16/2023 02:18 PM EST RP Workstation: [id number]   DG Chest Port 1 View Result Date: 12/16/2023 EXAM: 1 VIEW(S) XRAY OF THE CHEST 12/16/2023 01:16:18 PM COMPARISON: 11/09/2023 CLINICAL HISTORY: Chest tube in place. FINDINGS: LINES, TUBES AND DEVICES: Right chest tube with tip terminating along the right lung base. LUNGS AND PLEURA: Persistent opacification of right hemithorax, despite the chest tube. Known left perihilar mass. No pulmonary edema. No pneumothorax. HEART AND MEDIASTINUM: Atherosclerotic calcifications. No acute abnormality of the cardiac and mediastinal silhouettes. BONES AND SOFT TISSUES: No acute osseous abnormality. IMPRESSION: 1. Persistent opacification of the right hemithorax despite the chest tube. 2. Persistent left perihilar density Electronically signed by: Maude Stammer MD 12/16/2023 01:43 PM EST RP Workstation: [id number]

## 2023-12-24 NOTE — Progress Notes (Signed)
 Chemo RN presented to patient room. Patient verified using two separate identifiers. Role of Chemo RN explained. Questions answered. Consent for administration of rituximab obtained and placed in patient's chart. New PIV started in Right forearm with excellent blood return and flushed easily. Pre-medications administered by Primary RN. Rituximab infusion titrated to the 3rd rate increase. Patient complained of feeling cold. Infusion immediately stopped and 1L NS hung to gravity. Patient began having severe rigors and started sweating profusely. Infusion reaction protocol initiated and MD notified. Patient then developed shortness of breath with bilateral wheezing. See MAR for administered medications and flowsheets for vitals signs. After completion of reaction protocol patient returned to baseline. Rituximab infusion was restarted and completed without further issue. Allergy list updated.

## 2023-12-24 NOTE — Plan of Care (Signed)

## 2023-12-24 NOTE — Plan of Care (Signed)

## 2023-12-24 NOTE — Progress Notes (Signed)
 [facility]  PROGRESS NOTE  Timothy Newton FMW:[id number] DOB: [birth year] DOA: 12/16/2023 PCP: Dorcus Lamar POUR, MD   Brief HPI:    63 y.o. 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   Patient has been undergoing workup for lung mass by pulmonology.  He was seen in the ED on 10/5 for shortness of breath and underwent 1 L thoracentesis at that time with elevated white cells and positive mesothelioma cells.  Pulmonology recommended outpatient follow-up.   Patient followed up today with pulmonology and is short of breath again with recurrent pleural effusion.  Recommended to come to the ED for chest tube placement, further workup, EBUS and biopsy.  Preliminary pathology shows lymphoma.    Assessment/Plan:    Lung masses Recurrent pleural effusion - Has been followed by [facility] for lung mass and effusion -PET/CT at [facility] showed hypermetabolic bilateral consolidation or neoplasm, recommended bronchoscopy.  Hypermetabolic distal ileal mass, possible lymphoma, hypermetabolic mesenteric nodes - Sent by pulmonologist due to recurrent shortness of breath/reaccumulation of pleural effusion.  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 -Started on rituximab regimen on 12/24/2023    Fever -mild elevated in WBC count -hold on abx for now -culture with no growth     Hypertension - Continue home lisinopril    History of PUD History of MALT lymphoma - Noted         DVT prophylaxis: Lovenox  Medications     enoxaparin (LOVENOX) injection  40 mg Subcutaneous Q24H   hydrocortisone cream   Topical BID   lisinopril   30 mg Oral Daily   pneumococcal 20-valent conjugate vaccine  0.5 mL Intramuscular Tomorrow-1000   sodium chloride  flush  10 mL Intrapleural Q8H   sodium chloride  flush  10 mL Intrapleural Q8H   sodium chloride  flush  3  mL Intravenous Q12H     Data Reviewed:   CBG:  No results for input(s): GLUCAP in the last 168 hours.  SpO2: 96 % O2 Flow Rate (L/min): 2 L/min    Vitals:   12/24/23 1411 12/24/23 1440 12/24/23 1510 12/24/23 1535  BP: 118/77 114/77 118/78 127/84  Pulse: (!) 104 99 95 95  Resp: 16 18 16 18   Temp:      TempSrc:      SpO2: 94% 94% 95% 96%  Weight:      Height:          Data Reviewed:  Basic Metabolic Panel: Recent Labs  Lab 12/18/23 0518 12/19/23 0732 12/21/23 1002 12/22/23 0435 12/24/23 0152  NA 136 135 138 137 137  K 4.1 4.1 3.9 4.2 4.6  CL 102 99 104 100 100  CO2 23 25 26 25 24   GLUCOSE 95 200* 113* 134* 131*  BUN 15 20 14 17 13   CREATININE 1.34* 1.10 1.16 1.37* 1.41*  CALCIUM 9.8 9.7 9.4 9.7 9.9  MG  --   --   --   --  2.3  PHOS  --   --   --   --  3.7    CBC: Recent Labs  Lab 12/18/23 0518 12/19/23 0505 12/21/23 1002 12/22/23 0435 12/24/23 0152  WBC 9.7 9.5 10.4 17.5* 14.8*  NEUTROABS  --   --   --   --  10.9*  HGB 13.1 13.1 12.9* 12.6* 11.7*  HCT 40.7 40.8 40.9 39.6 37.2*  MCV 90.4 90.5 90.5 90.6 91.4  PLT 227 255 302 311 326    LFT No results for input(s): AST, ALT, ALKPHOS, BILITOT, PROT, ALBUMIN in the last 168 hours.   Antibiotics: Anti-infectives (From admission, onward)    Start     Dose/Rate Route Frequency Ordered Stop   12/16/23 2200  amoxicillin -clavulanate (AUGMENTIN ) 875-125 MG per tablet 1 tablet  Status:  Discontinued        1 tablet Oral Every 12 hours 12/16/23 1305 12/16/23 1330        CONSULTS oncology, pulmonology  Code Status: Full code  Family Communication: No family present at bedside     Subjective   Patient seen and examined, started on rituximab today   Objective    Physical Examination:   General-appears in no acute distress Heart-S1-S2, regular, no murmur auscultated Lungs-clear to auscultation bilaterally, no wheezing or crackles auscultated Abdomen-soft, nontender, no  organomegaly Extremities-no edema in the lower extremities Neuro-alert, oriented x3, no focal deficit noted  Status is: Inpatient:             Timothy Newton   [facility] If 7PM-7AM, please contact night-coverage at [email or web address], Office  [phone]   12/24/2023, 4:04 PM  LOS: 7 days

## 2023-12-25 ENCOUNTER — Telehealth: Payer: Self-pay

## 2023-12-25 ENCOUNTER — Other Ambulatory Visit: Payer: Self-pay

## 2023-12-25 DIAGNOSIS — J910 Malignant pleural effusion: Secondary | ICD-10-CM | POA: Diagnosis not present

## 2023-12-25 DIAGNOSIS — R918 Other nonspecific abnormal finding of lung field: Secondary | ICD-10-CM | POA: Diagnosis not present

## 2023-12-25 DIAGNOSIS — J90 Pleural effusion, not elsewhere classified: Secondary | ICD-10-CM | POA: Diagnosis not present

## 2023-12-25 DIAGNOSIS — C884 Extranodal marginal zone b-cell lymphoma of mucosa-associated lymphoid tissue (malt-lymphoma) not having achieved remission: Secondary | ICD-10-CM | POA: Diagnosis not present

## 2023-12-25 DIAGNOSIS — I10 Essential (primary) hypertension: Secondary | ICD-10-CM | POA: Diagnosis not present

## 2023-12-25 LAB — CBC WITH DIFFERENTIAL/PLATELET
Abs Immature Granulocytes: 0.04 K/uL (ref 0.00–0.07)
Basophils Absolute: 0 K/uL (ref 0.0–0.1)
Basophils Relative: 0 %
Eosinophils Absolute: 0 K/uL (ref 0.0–0.5)
Eosinophils Relative: 0 %
HCT: 35.9 % — ABNORMAL LOW (ref 39.0–52.0)
Hemoglobin: 11.3 g/dL — ABNORMAL LOW (ref 13.0–17.0)
Immature Granulocytes: 0 %
Lymphocytes Relative: 5 %
Lymphs Abs: 0.4 K/uL — ABNORMAL LOW (ref 0.7–4.0)
MCH: 28.6 pg (ref 26.0–34.0)
MCHC: 31.5 g/dL (ref 30.0–36.0)
MCV: 90.9 fL (ref 80.0–100.0)
Monocytes Absolute: 0.4 K/uL (ref 0.1–1.0)
Monocytes Relative: 4 %
Neutro Abs: 8.4 K/uL — ABNORMAL HIGH (ref 1.7–7.7)
Neutrophils Relative %: 91 %
Platelets: 340 K/uL (ref 150–400)
RBC: 3.95 MIL/uL — ABNORMAL LOW (ref 4.22–5.81)
RDW: 12.6 % (ref 11.5–15.5)
Smear Review: NORMAL
WBC: 9.2 K/uL (ref 4.0–10.5)
nRBC: 0 % (ref 0.0–0.2)

## 2023-12-25 LAB — BASIC METABOLIC PANEL WITH GFR
Anion gap: 10 (ref 5–15)
BUN: 20 mg/dL (ref 8–23)
CO2: 23 mmol/L (ref 22–32)
Calcium: 9.6 mg/dL (ref 8.9–10.3)
Chloride: 103 mmol/L (ref 98–111)
Creatinine, Ser: 1.15 mg/dL (ref 0.61–1.24)
GFR, Estimated: >60 mL/min (ref >60)
Glucose, Bld: 192 mg/dL — ABNORMAL HIGH (ref 70–99)
Potassium: 4.4 mmol/L (ref 3.5–5.1)
Sodium: 136 mmol/L (ref 135–145)

## 2023-12-25 LAB — LACTATE DEHYDROGENASE: LDH: 154 U/L (ref 105–235)

## 2023-12-25 LAB — PHOSPHORUS: Phosphorus: 2.8 mg/dL (ref 2.5–4.6)

## 2023-12-25 LAB — URIC ACID: Uric Acid, Serum: 6.9 mg/dL (ref 3.7–8.6)

## 2023-12-25 LAB — MAGNESIUM: Magnesium: 2.6 mg/dL — ABNORMAL HIGH (ref 1.7–2.4)

## 2023-12-25 MED ORDER — ACETAMINOPHEN 325 MG PO TABS: 650.0000 mg | ORAL_TABLET | Freq: Four times a day (QID) | ORAL | Status: AC | PRN

## 2023-12-25 NOTE — Addendum Note (Signed)
 Addended by: SANCHEZ FERNANDEZ, Reyansh Kushnir L on: 12/25/2023 05:01 PM   Modules accepted: Orders

## 2023-12-25 NOTE — Progress Notes (Incomplete)
 [facility]  PROGRESS NOTE  Timothy Newton FMW:[id number] DOB: [birth year] DOA: 12/16/2023 PCP: Dorcus Lamar POUR, MD   Brief HPI:    63 y.o. 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   Patient has been undergoing workup for lung mass by pulmonology.  He was seen in the ED on 10/5 for shortness of breath and underwent 1 L thoracentesis at that time with elevated white cells and positive mesothelioma cells.  Pulmonology recommended outpatient follow-up.   Patient followed up today with pulmonology and is short of breath again with recurrent pleural effusion.  Recommended to come to the ED for chest tube placement, further workup, EBUS and biopsy.  Preliminary pathology shows lymphoma.    Assessment/Plan:    Lung masses Recurrent pleural effusion - Has been followed by [facility] for lung mass and effusion -PET/CT at [facility] showed hypermetabolic bilateral consolidation or neoplasm, recommended bronchoscopy.  Hypermetabolic distal ileal mass, possible lymphoma, hypermetabolic mesenteric nodes - Sent by pulmonologist due to recurrent shortness of breath/reaccumulation of pleural effusion.  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 -Started on rituximab regimen on 12/24/2023    Fever -mild elevated in WBC count -hold on abx for now -culture with no growth     Hypertension - Continue home lisinopril    History of PUD History of MALT lymphoma - Noted         DVT prophylaxis: Lovenox  Medications     enoxaparin (LOVENOX) injection  40 mg Subcutaneous Q24H   hydrocortisone cream   Topical BID   lisinopril   30 mg Oral Daily   pneumococcal 20-valent conjugate vaccine  0.5 mL Intramuscular Tomorrow-1000   sodium chloride  flush  10 mL Intrapleural Q8H   sodium chloride  flush  10 mL Intrapleural Q8H   sodium chloride  flush  3  mL Intravenous Q12H     Data Reviewed:   CBG:  No results for input(s): GLUCAP in the last 168 hours.  SpO2: 94 % O2 Flow Rate (L/min): 2 L/min    Vitals:   12/24/23 2107 12/25/23 0056 12/25/23 0533 12/25/23 0845  BP: 119/78 118/81 (!) 143/94 128/80  Pulse: 97 87 90 (!) 110  Resp:    20  Temp: 97.6 F (36.4 C) 97.6 F (36.4 C)  98.2 F (36.8 C)  TempSrc: Oral Oral  Oral  SpO2: 92% 94% 97% 94%  Weight:      Height:          Data Reviewed:  Basic Metabolic Panel: Recent Labs  Lab 12/19/23 0732 12/21/23 1002 12/22/23 0435 12/24/23 0152 12/25/23 0203  NA 135 138 137 137 136  K 4.1 3.9 4.2 4.6 4.4  CL 99 104 100 100 103  CO2 25 26 25 24 23   GLUCOSE 200* 113* 134* 131* 192*  BUN 20 14 17 13 20   CREATININE 1.10 1.16 1.37* 1.41* 1.15  CALCIUM 9.7 9.4 9.7 9.9 9.6  MG  --   --   --  2.3 2.6*  PHOS  --   --   --  3.7 2.8    CBC: Recent Labs  Lab 12/19/23 0505 12/21/23 1002 12/22/23 0435 12/24/23 0152 12/25/23 0203  WBC 9.5 10.4 17.5* 14.8* 9.2  NEUTROABS  --   --   --  10.9* 8.4*  HGB 13.1 12.9* 12.6* 11.7* 11.3*  HCT 40.8 40.9 39.6 37.2* 35.9*  MCV 90.5  90.5 90.6 91.4 90.9  PLT 255 302 311 326 340    LFT No results for input(s): AST, ALT, ALKPHOS, BILITOT, PROT, ALBUMIN in the last 168 hours.   Antibiotics: Anti-infectives (From admission, onward)    Start     Dose/Rate Route Frequency Ordered Stop   12/16/23 2200  amoxicillin -clavulanate (AUGMENTIN ) 875-125 MG per tablet 1 tablet  Status:  Discontinued        1 tablet Oral Every 12 hours 12/16/23 1305 12/16/23 1330        CONSULTS oncology, pulmonology  Code Status: Full code  Family Communication: No family present at bedside     Subjective      Objective    Physical Examination:     Status is: Inpatient:             Timothy Newton   [facility] If 7PM-7AM, please contact night-coverage at [email or web address], Office   [phone]   12/25/2023, 9:34 AM  LOS: 8 days

## 2023-12-25 NOTE — Progress Notes (Signed)
 Discharge   Patient expressed verbal understanding of discharge POC.   Patient given time to ask any questions.  Additional education included in AVS.  Alert oriented in good spirits.   PIV removed.  Pressure dressings intact.  VSS.  Transferring to Main A.

## 2023-12-25 NOTE — Discharge Summary (Signed)
 Physician Discharge Summary   Patient: Timothy Newton MRN: [id number] DOB: 09/08/1960  Admit date:     12/16/2023  Discharge date: 12/25/23  Discharge Physician: Sabas GORMAN Brod   PCP: Dorcus Lamar POUR, MD   Recommendations at discharge:    follow oncology as outpatient  Discharge Diagnoses: Principal Problem:   Lung mass Active Problems:   MALT lymphoma (HCC)   Essential hypertension   Pleural effusion   Need for management of chest tube   Malignant pleural effusion (HCC)  Resolved Problems:   * No resolved hospital problems. *  Hospital Course: 63 y.o. 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   Patient has been undergoing workup for lung mass by pulmonology.  He was seen in the ED on 10/5 for shortness of breath and underwent 1 L thoracentesis at that time with elevated white cells and positive mesothelioma cells.  Pulmonology recommended outpatient follow-up.   Patient followed up today with pulmonology and is short of breath again with recurrent pleural effusion.  Recommended to come to the ED for chest tube placement, further workup, EBUS and biopsy.  Preliminary pathology shows lymphoma.     Assessment and Plan:  Lung masses Recurrent pleural effusion - Has been followed by [facility] for lung mass and effusion -PET/CT at [facility] showed hypermetabolic bilateral consolidation or neoplasm, recommended bronchoscopy.  Hypermetabolic distal ileal mass, possible lymphoma, hypermetabolic mesenteric nodes - Sent by pulmonologist due to recurrent shortness of breath/reaccumulation of pleural effusion.  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  Will follow-up with oncology as outpatient for further treatment with chemotherapy as outpatient      Fever -mild elevated in WBC count -culture with no growth     Hypertension - Continue home lisinopril    History of PUD History of MALT lymphoma - Noted       Consultants: [facility] Procedures performed: Chest tube placement, bronchoscopy Disposition: Home Diet recommendation:  Discharge Diet Orders (From admission, onward)     Start     Ordered   12/25/23 0000  Diet - low sodium heart healthy        12/25/23 1438           Regular diet DISCHARGE MEDICATION: Allergies as of 12/25/2023       Reactions   Rituximab Shortness Of Breath, Other (See Comments)   Patient had hypersensitivity reaction to rituximab during first infusion. Severe rigors, shortness of breath with wheezing, diaphoresis. Resolved after administration of benadryl, pepcid, solu-medrol, morphine, and 1L NS.         Medication List     STOP taking these medications    amoxicillin -clavulanate 875-125 MG tablet Commonly known as: AUGMENTIN    ibuprofen 200 MG tablet Commonly known as: ADVIL       TAKE these medications    acetaminophen  325 MG tablet Commonly known as: TYLENOL  Take 2 tablets (650 mg total) by mouth every 6 (six) hours as needed for mild pain (pain score 1-3) or fever (or Fever >/= 101).   albuterol 108 (90 Base) MCG/ACT inhaler Commonly known as: VENTOLIN HFA Inhale 2 puffs into the lungs.   lisinopril  20 MG tablet Commonly known as: ZESTRIL  Take 30 mg by mouth daily.   oxyCODONE -acetaminophen  5-325 MG tablet Commonly known as: Percocet Take 1 tablet by mouth  every 6 (six) hours as needed.        Discharge Exam: Filed Weights   12/16/23 1031 12/18/23 0857  Weight: 108.9 kg 111.1 kg   General-appears in no acute distress Heart-S1-S2, regular, no murmur auscultated Lungs-clear to auscultation bilaterally, no wheezing or crackles auscultated Abdomen-soft, nontender, no organomegaly Extremities-no edema in the lower extremities Neuro-alert, oriented x3, no  focal deficit noted  Condition at discharge: good  The results of significant diagnostics from this hospitalization (including imaging, microbiology, ancillary and laboratory) are listed below for reference.   Imaging Studies: DG CHEST PORT 1 VIEW Result Date: 12/23/2023 EXAM: 1 VIEW(S) XRAY OF THE CHEST _study_datetime_ COMPARISON: 12/21/2023 CLINICAL HISTORY: Chest tube in place. FINDINGS: LINES, TUBES AND DEVICES: Right sided pigtail catheter is unchanged in position. LUNGS AND PLEURA: Increased pleural parenchymal opacification throughout the majority of the right hemithorax. Pleural air-fluid levels are less distinct today. Chronic interstitial thickening without left sided pulmonary opacity. HEART AND MEDIASTINUM: No acute abnormality of the cardiac and mediastinal silhouettes. BONES AND SOFT TISSUES: No acute osseous abnormality. IMPRESSION: 1. Worsened right sided aeration, likely due to a combination of pleural fluid, possible pleural air, and airspace disease. 2. Right sided pleural pigtail catheter remains in place. Electronically signed by: Rockey Kilts MD 12/23/2023 12:05 PM EST RP Workstation: [id number]   DG Chest Port 1 View Result Date: 12/21/2023 CLINICAL DATA:  Pleural effusion. EXAM: PORTABLE CHEST 1 VIEW COMPARISON:  Radiographs 12/19/2023 and 12/17/2023.  CT 12/17/2023. FINDINGS: 0904 hours. Right basilar pigtail catheter is unchanged in position. No significant change in the small right pleural effusion with possible pleural air-fluid levels of this erect examination. The underlying airspace disease throughout the right lung and in the left perihilar region has not significantly changed. New left-sided pleural effusion or pneumothorax. The heart size and mediastinal contours stable. IMPRESSION: 1. No significant change in small right pleural effusion with possible pleural air-fluid levels. Stable right basilar pigtail catheter. 2. Stable bilateral airspace disease. Electronically  Signed   By: Elsie Perone M.D.   On: 12/21/2023 13:12   DG CHEST PORT 1 VIEW Result Date: 12/19/2023 CLINICAL DATA:  Follow-up right chest tube and pleural effusion. EXAM: PORTABLE CHEST 1 VIEW COMPARISON:  12/17/2023 FINDINGS: Stable right basilar pigtail pleural catheter. Mildly improved aeration of the right lung with slightly less pleural fluid and consolidation. Clear left lung. Mild peribronchial thickening. Normal-sized heart. Stable right glenoid cystic areas. IMPRESSION: 1. Mildly improved aeration of the right lung with slightly less pleural fluid and consolidation. 2. Stable right basilar pigtail pleural catheter. 3. Mild bronchitic changes. Electronically Signed   By: Elspeth Bathe M.D.   On: 12/19/2023 18:18   CT CHEST ABDOMEN PELVIS W CONTRAST Result Date: 12/17/2023 CLINICAL DATA:  Hematologic malignancy staging, history of MALT, lung mass with worsening effusion * Tracking Code: BO * EXAM: CT CHEST, ABDOMEN, AND PELVIS WITH CONTRAST TECHNIQUE: Multidetector CT imaging of the chest, abdomen and pelvis was performed following the standard protocol during bolus administration of intravenous contrast. RADIATION DOSE REDUCTION: This exam was performed according to the departmental dose-optimization program which includes automated exposure control, adjustment of the mA and/or kV according to patient size and/or use of iterative reconstruction technique. CONTRAST:  75mL OMNIPAQUE  IOHEXOL  350 MG/ML SOLN COMPARISON:  CT chest angiogram, 11/09/2023, CT abdomen pelvis, 12/26/2022 FINDINGS: CT CHEST FINDINGS Cardiovascular: No significant vascular findings. Normal heart size. No pericardial effusion. Mediastinum/Nodes: Unchanged enlarged mediastinal lymph nodes, pretracheal nodes measuring up to 2.4 x 1.1 cm (series  2, image 19). Thyroid gland, trachea, and esophagus demonstrate no significant findings. Lungs/Pleura: Diminished volume of a loculated right pleural effusion containing numerous small air  loculations, pigtail catheter with formed pigtail in the dependent right pleural space (series 2, image 47). Very dense consolidation of the right upper lobe, not significantly changed, with additional very dense consolidation of the right middle lobe also not significantly changed. There is however somewhat improved aeration of the right lower lobe secondary to diminished effusion volume. Additional dense consolidation of the posterior suprahilar left upper lobe is not significantly changed, nor are numerous scattered irregular opacities throughout the remainder of the aerated portions of the lungs (series 4, image 40, 18). Trace left pleural effusion. Musculoskeletal: No chest wall abnormality. No acute osseous findings. CT ABDOMEN PELVIS FINDINGS Hepatobiliary: No solid liver abnormality is seen. Contracted gallbladder. No gallstones, gallbladder wall thickening, or biliary dilatation. Pancreas: Unremarkable. No pancreatic ductal dilatation or surrounding inflammatory changes. Spleen: Normal in size without significant abnormality. Adrenals/Urinary Tract: Adrenal glands are unremarkable. Kidneys are normal, without renal calculi, solid lesion, or hydronephrosis. Bladder is unremarkable. Stomach/Bowel: Stomach is within normal limits. Appendix appears normal. Similar circumferential mass of the terminal ileum measuring 6.1 x 3.5 x 3.3 cm (series 6, image 65, series 2, image 101). Pancolonic diverticulosis. Vascular/Lymphatic: No significant vascular findings are present. No enlarged abdominal or pelvic lymph nodes. Reproductive: Mild Prostatomegaly. Other: No abdominal wall hernia or abnormality. No ascites. Musculoskeletal: No acute osseous findings. IMPRESSION: 1. Diminished volume of a loculated right pleural effusion containing numerous small air loculations, pigtail catheter with formed pigtail in the dependent right pleural space. 2. Very dense consolidation of the right upper lobe, not significantly  changed, with additional very dense consolidation of the right middle lobe also not significantly changed. There is however somewhat improved aeration of the right lower lobe secondary to diminished effusion volume. 3. Additional dense consolidation of the posterior suprahilar left upper lobe is not significantly changed, nor are numerous scattered irregular opacities throughout the remainder of the aerated portions of the lungs. 4. Constellation of pulmonary findings is suggestive of pulmonary lymphomatous involvement especially inasmuch as right middle lobe consolidation was present on a relatively remote CT examination of the abdomen pelvis dated 2024. 5. Unchanged enlarged mediastinal lymph nodes. 6. 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 7. Pancolonic diverticulosis without evidence of acute diverticulitis. Electronically Signed   By: Marolyn JONETTA Jaksch M.D.   On: 12/17/2023 13:16   DG Chest Port 1 View Result Date: 12/17/2023 EXAM: 1 VIEW(S) XRAY OF THE CHEST 12/17/2023 06:52:00 AM COMPARISON: AP radiograph of the chest dated 12/16/2023. CLINICAL HISTORY: [id number] Chest tube in place [id number] Chest tube in place FINDINGS: LINES, TUBES AND DEVICES: A pigtail catheter is again seen projecting over the right lung base. LUNGS AND PLEURA: Interval improved aeration of the right lung, particularly the right mid lung zone. A right-sided pleural effusion has likely decreased in the interim. The left lung appears clear. No focal pulmonary opacity. No pulmonary edema. No pneumothorax. HEART AND MEDIASTINUM: No acute abnormality of the cardiac and mediastinal silhouettes. BONES AND SOFT TISSUES: No acute osseous abnormality. IMPRESSION: 1. Interval improved aeration of the right lung, particularly the right mid lung zone. 2. Likely decreased right-sided pleural effusion. 3. Pigtail catheter in place over the right lung base. Electronically signed by:  Evalene Coho MD 12/17/2023 06:57 AM EST RP Workstation: [id number]   DG Chest 2 View  Result Date: 12/16/2023 EXAM: 2 VIEW(S) XRAY OF THE CHEST 12/16/2023 01:55:00 PM COMPARISON: 11/09/2023 CLINICAL HISTORY: shortness of breath FINDINGS: LUNGS AND PLEURA: Complete opacification of right hemithorax, significantly worsened since prior study. Persistent masslike airspace opacity in the left suprahilar region. No left pleural effusion. No pneumothorax. HEART AND MEDIASTINUM: No acute abnormality of the cardiac and mediastinal silhouettes. BONES AND SOFT TISSUES: No acute osseous abnormality. IMPRESSION: 1. Complete opacification of the right hemithorax, significantly worsened since the prior study, probably representing a combination of large effusion and compressive atelectasis versus airspace disease. 2. Similarly appearing masslike opacity in the left suprahilar region. Electronically signed by: Rogelia Myers MD 12/16/2023 02:18 PM EST RP Workstation: [id number]   DG Chest Port 1 View Result Date: 12/16/2023 EXAM: 1 VIEW(S) XRAY OF THE CHEST 12/16/2023 01:16:18 PM COMPARISON: 11/09/2023 CLINICAL HISTORY: Chest tube in place. FINDINGS: LINES, TUBES AND DEVICES: Right chest tube with tip terminating along the right lung base. LUNGS AND PLEURA: Persistent opacification of right hemithorax, despite the chest tube. Known left perihilar mass. No pulmonary edema. No pneumothorax. HEART AND MEDIASTINUM: Atherosclerotic calcifications. No acute abnormality of the cardiac and mediastinal silhouettes. BONES AND SOFT TISSUES: No acute osseous abnormality. IMPRESSION: 1. Persistent opacification of the right hemithorax despite the chest tube. 2. Persistent left perihilar density Electronically signed by: Maude Stammer MD 12/16/2023 01:43 PM EST RP Workstation: [id number]    Microbiology: Results for orders placed or performed during the hospital encounter of 12/16/23  Body fluid culture w Gram Stain     Status:  None   Collection Time: 12/16/23 12:58 PM   Specimen: Path fluid; Pleural Fluid  Result Value Ref Range Status   Specimen Description FLUID  Final   Special Requests NONE  Final   Gram Stain   Final    FEW WBC PRESENT, PREDOMINANTLY MONONUCLEAR NO ORGANISMS SEEN    Culture   Final    NO GROWTH 3 DAYS Performed at [facility], [address] [place], [place] [address]    Report Status 12/19/2023 FINAL  Final    Labs: CBC: Recent Labs  Lab 12/19/23 0505 12/21/23 1002 12/22/23 0435 12/24/23 0152 12/25/23 0203  WBC 9.5 10.4 17.5* 14.8* 9.2  NEUTROABS  --   --   --  10.9* 8.4*  HGB 13.1 12.9* 12.6* 11.7* 11.3*  HCT 40.8 40.9 39.6 37.2* 35.9*  MCV 90.5 90.5 90.6 91.4 90.9  PLT 255 302 311 326 340   Basic Metabolic Panel: Recent Labs  Lab 12/19/23 0732 12/21/23 1002 12/22/23 0435 12/24/23 0152 12/25/23 0203  NA 135 138 137 137 136  K 4.1 3.9 4.2 4.6 4.4  CL 99 104 100 100 103  CO2 25 26 25 24 23   GLUCOSE 200* 113* 134* 131* 192*  BUN 20 14 17 13 20   CREATININE 1.10 1.16 1.37* 1.41* 1.15  CALCIUM 9.7 9.4 9.7 9.9 9.6  MG  --   --   --  2.3 2.6*  PHOS  --   --   --  3.7 2.8   Liver Function Tests: No results for input(s): AST, ALT, ALKPHOS, BILITOT, PROT, ALBUMIN in the last 168 hours. CBG: No results for input(s): GLUCAP in the last 168 hours.  Discharge time spent: greater than 30 minutes.  Signed: Sabas GORMAN Brod, MD [facility] 12/25/2023

## 2023-12-25 NOTE — Progress Notes (Addendum)
 ADDENDUM:  Patient was personally and independently interviewed, examined and relevant elements of the history of present illness were reviewed in details and an assessment and plan was created. All elements of the patient's history of present illness, assessment and plan were discussed in detail with Olam JINNY Brunner, NP. The above documentation reflects our combined findings assessment and plan.   Briefly, 63 year old gentleman with history of MALT lymphoma, originally diagnosed in 2006, now with disease progression with pleural effusions.  He was started on systemic treatment with rituximab from 12/24/2023.  He had infusion reaction with rigors, which responded to extra allergy medicines and morphine.   Subjectively he is feeling a lot better today.  No evidence of tumor lysis on labs.  We will plan to continue treatment in the outpatient setting.  Our clinic will arrange appointments and inform patient.  Patient can be discharged home from our standpoint.  Timothy Newton   DOB:01-16-1961   FM#:[id number]      ASSESSMENT & PLAN:  Timothy Newton 63 y.o. male who came to the ED on 12/16/2023 with complaints of shortness of breath.  Previously seen by pulmonary for same and had a thoracentesis which was positive for mesothelial cells.  Subsequent path was positive for MALT lymphoma.  Chemotherapy initiated 12/24/23.  Medical Oncology following.    MALT lymphoma Lung mass with enlarged mediastinal lymph nodes - Initially diagnosed in 2006 with MALT lymphoma.  States he had no chemotherapy, immunotherapy or RT at that time.  Admits to no follow-up with oncology for several years. -- Seen by PCP and Pulm prior to this admission.  Pet scan 10/27 at [facility] showed hypermetabolic bilateral consolidation or neoplasm.   - He had bronch with biopsy 12/16/2023. Path showed abnormal clonal B-cell population.  Cytology shows malignant cells present.  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 -- Clinical picture is consistent with  MALT lymphoma, which was previously untreated but now is causing complications including driving pleural effusions.  This would warrant treatment initiation.  Typically treatments are given with single agent rituximab.  -- Initiated Rituximab regimen 12/24/23. Patient had infusional reaction w/rigors necessitating additional pre-medication.  Rechallenged with rituximab and seen tolerating well.   - Medical oncology/Dr. Nakeda Lebron will make further evaluation and treatment recommendations   Pleural effusion, recurrent Shortness of breath - Stable -- Imaging 12/17/2023 showed right pleural effusion - Status post thoracentesis and status post chest tube, d/c 11/18.  - Pulmonary following   Anemia, mild, likely related to lymphoma and acute illness - Hemoglobin stable 11.3  -- No transfusional intervention required. - Continue to monitor CBC with differential   Renal dysfunction -- Creatinine and BUN normalized.  -- Avoid nephrotoxic agents -- Continue to monitor renal function    Code Status Full   Subjective:  Patient seen awake and alert sitting up in chair eating lunch.  Reports that he feels good and has no complaints.  Denies chest pain.  Denies respiratory issues or shortness of breath.  Denies acute GI symptoms.  Objective:   Intake/Output Summary (Last 24 hours) at 12/25/2023 1242 Last data filed at 12/24/2023 1833 Gross per 24 hour  Intake 1351.17 ml  Output 400 ml  Net 951.17 ml     PHYSICAL EXAMINATION: ECOG PERFORMANCE STATUS: 1 - Symptomatic but completely ambulatory  Vitals:   12/25/23 0845 12/25/23 1048  BP: 128/80 134/80  Pulse: (!) 110 (!) 108  Resp: 20   Temp: 98.2 F (36.8 C) 98  F (36.7 C)  SpO2: 94% 95%   Filed Weights   12/16/23 1031 12/18/23 0857  Weight: 240 lb (108.9 kg) 245 lb (111.1 kg)    GENERAL: alert, no distress and comfortable SKIN: skin color,  texture, turgor are normal, no rashes or significant lesions EYES: normal, conjunctiva are pink and non-injected, sclera clear OROPHARYNX: no exudate, no erythema and lips, buccal mucosa, and tongue normal  NECK: supple, thyroid normal size, non-tender, without nodularity LYMPH: no palpable lymphadenopathy in the cervical, axillary or inguinal LUNGS: clear to auscultation and percussion with normal breathing effort HEART: regular rate & rhythm and no murmurs and no lower extremity edema ABDOMEN: abdomen soft, non-tender and normal bowel sounds MUSCULOSKELETAL: no cyanosis of digits and no clubbing  PSYCH: alert & oriented x 3 with fluent speech NEURO: no focal motor/sensory deficits   All questions were answered. The patient knows to call the clinic with any problems, questions or concerns.   The total time spent in the appointment was 40 minutes encounter with patient including review of chart and various tests results, discussions about plan of care and coordination of care plan  Olam JINNY Brunner, NP 12/25/2023 12:42 PM    Labs Reviewed:  Lab Results  Component Value Date   WBC 9.2 12/25/2023   HGB 11.3 (L) 12/25/2023   HCT 35.9 (L) 12/25/2023   MCV 90.9 12/25/2023   PLT 340 12/25/2023   Recent Labs    12/17/23 0207 12/18/23 0518 12/22/23 0435 12/24/23 0152 12/25/23 0203  NA 137   < > 137 137 136  K 3.9   < > 4.2 4.6 4.4  CL 104   < > 100 100 103  CO2 25   < > 25 24 23   GLUCOSE 125*   < > 134* 131* 192*  BUN 14   < > 17 13 20   CREATININE 1.27*   < > 1.37* 1.41* 1.15  CALCIUM 9.4   < > 9.7 9.9 9.6  GFRNONAA >60   < > 58* 56* >60  PROT 8.2*  --   --   --   --   ALBUMIN 2.7*  --   --   --   --   AST 17  --   --   --   --   ALT 13  --   --   --   --   ALKPHOS 49  --   --   --   --   BILITOT 1.2  --   --   --   --    < > = values in this interval not displayed.    Studies Reviewed:  DG CHEST PORT 1 VIEW Result Date: 12/23/2023 EXAM: 1 VIEW(S) XRAY OF THE CHEST  _study_datetime_ COMPARISON: 12/21/2023 CLINICAL HISTORY: Chest tube in place. FINDINGS: LINES, TUBES AND DEVICES: Right sided pigtail catheter is unchanged in position. LUNGS AND PLEURA: Increased pleural parenchymal opacification throughout the majority of the right hemithorax. Pleural air-fluid levels are less distinct today. Chronic interstitial thickening without left sided pulmonary opacity. HEART AND MEDIASTINUM: No acute abnormality of the cardiac and mediastinal silhouettes. BONES AND SOFT TISSUES: No acute osseous abnormality. IMPRESSION: 1. Worsened right sided aeration, likely due to a combination of pleural fluid, possible pleural air, and airspace disease. 2. Right sided pleural pigtail catheter remains in place. Electronically signed by: Rockey Kilts MD 12/23/2023 12:05 PM EST RP Workstation: [id number]   DG Chest Port 1 View Result Date: 12/21/2023 CLINICAL DATA:  Pleural effusion.  EXAM: PORTABLE CHEST 1 VIEW COMPARISON:  Radiographs 12/19/2023 and 12/17/2023.  CT 12/17/2023. FINDINGS: 0904 hours. Right basilar pigtail catheter is unchanged in position. No significant change in the small right pleural effusion with possible pleural air-fluid levels of this erect examination. The underlying airspace disease throughout the right lung and in the left perihilar region has not significantly changed. New left-sided pleural effusion or pneumothorax. The heart size and mediastinal contours stable. IMPRESSION: 1. No significant change in small right pleural effusion with possible pleural air-fluid levels. Stable right basilar pigtail catheter. 2. Stable bilateral airspace disease. Electronically Signed   By: Elsie Perone M.D.   On: 12/21/2023 13:12   DG CHEST PORT 1 VIEW Result Date: 12/19/2023 CLINICAL DATA:  Follow-up right chest tube and pleural effusion. EXAM: PORTABLE CHEST 1 VIEW COMPARISON:  12/17/2023 FINDINGS: Stable right basilar pigtail pleural catheter. Mildly improved aeration of the  right lung with slightly less pleural fluid and consolidation. Clear left lung. Mild peribronchial thickening. Normal-sized heart. Stable right glenoid cystic areas. IMPRESSION: 1. Mildly improved aeration of the right lung with slightly less pleural fluid and consolidation. 2. Stable right basilar pigtail pleural catheter. 3. Mild bronchitic changes. Electronically Signed   By: Elspeth Bathe M.D.   On: 12/19/2023 18:18   CT CHEST ABDOMEN PELVIS W CONTRAST Result Date: 12/17/2023 CLINICAL DATA:  Hematologic malignancy staging, history of MALT, lung mass with worsening effusion * Tracking Code: BO * EXAM: CT CHEST, ABDOMEN, AND PELVIS WITH CONTRAST TECHNIQUE: Multidetector CT imaging of the chest, abdomen and pelvis was performed following the standard protocol during bolus administration of intravenous contrast. RADIATION DOSE REDUCTION: This exam was performed according to the departmental dose-optimization program which includes automated exposure control, adjustment of the mA and/or kV according to patient size and/or use of iterative reconstruction technique. CONTRAST:  75mL OMNIPAQUE  IOHEXOL  350 MG/ML SOLN COMPARISON:  CT chest angiogram, 11/09/2023, CT abdomen pelvis, 12/26/2022 FINDINGS: CT CHEST FINDINGS Cardiovascular: No significant vascular findings. Normal heart size. No pericardial effusion. Mediastinum/Nodes: Unchanged enlarged mediastinal lymph nodes, pretracheal nodes measuring up to 2.4 x 1.1 cm (series 2, image 19). Thyroid gland, trachea, and esophagus demonstrate no significant findings. Lungs/Pleura: Diminished volume of a loculated right pleural effusion containing numerous small air loculations, pigtail catheter with formed pigtail in the dependent right pleural space (series 2, image 47). Very dense consolidation of the right upper lobe, not significantly changed, with additional very dense consolidation of the right middle lobe also not significantly changed. There is however somewhat  improved aeration of the right lower lobe secondary to diminished effusion volume. Additional dense consolidation of the posterior suprahilar left upper lobe is not significantly changed, nor are numerous scattered irregular opacities throughout the remainder of the aerated portions of the lungs (series 4, image 40, 18). Trace left pleural effusion. Musculoskeletal: No chest wall abnormality. No acute osseous findings. CT ABDOMEN PELVIS FINDINGS Hepatobiliary: No solid liver abnormality is seen. Contracted gallbladder. No gallstones, gallbladder wall thickening, or biliary dilatation. Pancreas: Unremarkable. No pancreatic ductal dilatation or surrounding inflammatory changes. Spleen: Normal in size without significant abnormality. Adrenals/Urinary Tract: Adrenal glands are unremarkable. Kidneys are normal, without renal calculi, solid lesion, or hydronephrosis. Bladder is unremarkable. Stomach/Bowel: Stomach is within normal limits. Appendix appears normal. Similar circumferential mass of the terminal ileum measuring 6.1 x 3.5 x 3.3 cm (series 6, image 65, series 2, image 101). Pancolonic diverticulosis. Vascular/Lymphatic: No significant vascular findings are present. No enlarged abdominal or pelvic lymph nodes. Reproductive: Mild Prostatomegaly. Other: No  abdominal wall hernia or abnormality. No ascites. Musculoskeletal: No acute osseous findings. IMPRESSION: 1. Diminished volume of a loculated right pleural effusion containing numerous small air loculations, pigtail catheter with formed pigtail in the dependent right pleural space. 2. Very dense consolidation of the right upper lobe, not significantly changed, with additional very dense consolidation of the right middle lobe also not significantly changed. There is however somewhat improved aeration of the right lower lobe secondary to diminished effusion volume. 3. Additional dense consolidation of the posterior suprahilar left upper lobe is not significantly  changed, nor are numerous scattered irregular opacities throughout the remainder of the aerated portions of the lungs. 4. Constellation of pulmonary findings is suggestive of pulmonary lymphomatous involvement especially inasmuch as right middle lobe consolidation was present on a relatively remote CT examination of the abdomen pelvis dated 2024. 5. Unchanged enlarged mediastinal lymph nodes. 6. 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 7. Pancolonic diverticulosis without evidence of acute diverticulitis. Electronically Signed   By: Marolyn JONETTA Jaksch M.D.   On: 12/17/2023 13:16   DG Chest Port 1 View Result Date: 12/17/2023 EXAM: 1 VIEW(S) XRAY OF THE CHEST 12/17/2023 06:52:00 AM COMPARISON: AP radiograph of the chest dated 12/16/2023. CLINICAL HISTORY: [id number] Chest tube in place [id number] Chest tube in place FINDINGS: LINES, TUBES AND DEVICES: A pigtail catheter is again seen projecting over the right lung base. LUNGS AND PLEURA: Interval improved aeration of the right lung, particularly the right mid lung zone. A right-sided pleural effusion has likely decreased in the interim. The left lung appears clear. No focal pulmonary opacity. No pulmonary edema. No pneumothorax. HEART AND MEDIASTINUM: No acute abnormality of the cardiac and mediastinal silhouettes. BONES AND SOFT TISSUES: No acute osseous abnormality. IMPRESSION: 1. Interval improved aeration of the right lung, particularly the right mid lung zone. 2. Likely decreased right-sided pleural effusion. 3. Pigtail catheter in place over the right lung base. Electronically signed by: Evalene Coho MD 12/17/2023 06:57 AM EST RP Workstation: [id number]   DG Chest 2 View Result Date: 12/16/2023 EXAM: 2 VIEW(S) XRAY OF THE CHEST 12/16/2023 01:55:00 PM COMPARISON: 11/09/2023 CLINICAL HISTORY: shortness of breath FINDINGS: LUNGS AND PLEURA: Complete opacification of right hemithorax,  significantly worsened since prior study. Persistent masslike airspace opacity in the left suprahilar region. No left pleural effusion. No pneumothorax. HEART AND MEDIASTINUM: No acute abnormality of the cardiac and mediastinal silhouettes. BONES AND SOFT TISSUES: No acute osseous abnormality. IMPRESSION: 1. Complete opacification of the right hemithorax, significantly worsened since the prior study, probably representing a combination of large effusion and compressive atelectasis versus airspace disease. 2. Similarly appearing masslike opacity in the left suprahilar region. Electronically signed by: Rogelia Myers MD 12/16/2023 02:18 PM EST RP Workstation: [id number]   DG Chest Port 1 View Result Date: 12/16/2023 EXAM: 1 VIEW(S) XRAY OF THE CHEST 12/16/2023 01:16:18 PM COMPARISON: 11/09/2023 CLINICAL HISTORY: Chest tube in place. FINDINGS: LINES, TUBES AND DEVICES: Right chest tube with tip terminating along the right lung base. LUNGS AND PLEURA: Persistent opacification of right hemithorax, despite the chest tube. Known left perihilar mass. No pulmonary edema. No pneumothorax. HEART AND MEDIASTINUM: Atherosclerotic calcifications. No acute abnormality of the cardiac and mediastinal silhouettes. BONES AND SOFT TISSUES: No acute osseous abnormality. IMPRESSION: 1. Persistent opacification of the right hemithorax despite the chest tube. 2. Persistent left perihilar density Electronically signed by: Maude Stammer MD 12/16/2023 01:43 PM EST RP Workstation: [id number]

## 2023-12-25 NOTE — Telephone Encounter (Signed)
 We will need an order for the CT

## 2023-12-26 ENCOUNTER — Other Ambulatory Visit: Payer: Self-pay | Admitting: Oncology

## 2023-12-26 ENCOUNTER — Telehealth: Payer: Self-pay | Admitting: Oncology

## 2023-12-26 DIAGNOSIS — C884 Extranodal marginal zone b-cell lymphoma of mucosa-associated lymphoid tissue (malt-lymphoma) not having achieved remission: Secondary | ICD-10-CM

## 2023-12-26 NOTE — Telephone Encounter (Signed)
 Patient has been scheduled for follow-up visit per secure chat request.  LVM notifying pt of appt details, provided my direct number to pt if appt changes need to be made.     Hi, I saw this patient in the hospital for MALT lymphoma. We started treatments with rituximab . He needs to continue this once a week. Can we arrange follow-up appointment for him on 12/30/2023 for labs, office visit and rituximab  infusion and then weekly for 3 more weeks. He did have expected infusion reaction with rituximab  which resolved with morphine . So we may have to run it slow to begin with, as if we were to do it with the first dose. Please let me know if any further questions. Thank you, [name], MD

## 2023-12-26 NOTE — Telephone Encounter (Signed)
 [facility] the order is in there.

## 2023-12-28 ENCOUNTER — Other Ambulatory Visit: Payer: Self-pay

## 2023-12-29 ENCOUNTER — Telehealth: Payer: Self-pay | Admitting: Oncology

## 2023-12-29 ENCOUNTER — Other Ambulatory Visit: Payer: Self-pay

## 2023-12-29 ENCOUNTER — Other Ambulatory Visit: Payer: Self-pay | Admitting: Oncology

## 2023-12-29 DIAGNOSIS — C884 Extranodal marginal zone b-cell lymphoma of mucosa-associated lymphoid tissue (malt-lymphoma) not having achieved remission: Secondary | ICD-10-CM

## 2023-12-29 NOTE — Telephone Encounter (Signed)
 Called PT to update on new appt time. Time confirmed with PT.

## 2023-12-30 ENCOUNTER — Inpatient Hospital Stay

## 2023-12-30 ENCOUNTER — Inpatient Hospital Stay: Attending: Oncology | Admitting: Oncology

## 2023-12-30 ENCOUNTER — Other Ambulatory Visit: Payer: Self-pay

## 2023-12-30 ENCOUNTER — Encounter: Payer: Self-pay | Admitting: Oncology

## 2023-12-30 VITALS — BP 133/86 | HR 80 | Temp 97.9°F | Resp 18 | Ht 74.0 in | Wt 233.6 lb

## 2023-12-30 VITALS — BP 164/89 | HR 90 | Temp 98.3°F | Resp 18

## 2023-12-30 DIAGNOSIS — N400 Enlarged prostate without lower urinary tract symptoms: Secondary | ICD-10-CM | POA: Insufficient documentation

## 2023-12-30 DIAGNOSIS — R0602 Shortness of breath: Secondary | ICD-10-CM | POA: Insufficient documentation

## 2023-12-30 DIAGNOSIS — R61 Generalized hyperhidrosis: Secondary | ICD-10-CM | POA: Insufficient documentation

## 2023-12-30 DIAGNOSIS — K5730 Diverticulosis of large intestine without perforation or abscess without bleeding: Secondary | ICD-10-CM | POA: Diagnosis not present

## 2023-12-30 DIAGNOSIS — Z87891 Personal history of nicotine dependence: Secondary | ICD-10-CM | POA: Diagnosis not present

## 2023-12-30 DIAGNOSIS — Z79899 Other long term (current) drug therapy: Secondary | ICD-10-CM | POA: Insufficient documentation

## 2023-12-30 DIAGNOSIS — R6889 Other general symptoms and signs: Secondary | ICD-10-CM | POA: Insufficient documentation

## 2023-12-30 DIAGNOSIS — J9801 Acute bronchospasm: Secondary | ICD-10-CM | POA: Insufficient documentation

## 2023-12-30 DIAGNOSIS — J910 Malignant pleural effusion: Secondary | ICD-10-CM | POA: Insufficient documentation

## 2023-12-30 DIAGNOSIS — Z8711 Personal history of peptic ulcer disease: Secondary | ICD-10-CM | POA: Insufficient documentation

## 2023-12-30 DIAGNOSIS — J984 Other disorders of lung: Secondary | ICD-10-CM | POA: Insufficient documentation

## 2023-12-30 DIAGNOSIS — R222 Localized swelling, mass and lump, trunk: Secondary | ICD-10-CM | POA: Insufficient documentation

## 2023-12-30 DIAGNOSIS — C884 Extranodal marginal zone b-cell lymphoma of mucosa-associated lymphoid tissue (malt-lymphoma) not having achieved remission: Secondary | ICD-10-CM

## 2023-12-30 DIAGNOSIS — I10 Essential (primary) hypertension: Secondary | ICD-10-CM | POA: Diagnosis not present

## 2023-12-30 DIAGNOSIS — J948 Other specified pleural conditions: Secondary | ICD-10-CM | POA: Insufficient documentation

## 2023-12-30 LAB — CMP (CANCER CENTER ONLY)
ALT: 35 U/L (ref 0–44)
AST: 24 U/L (ref 15–41)
Albumin: 3.3 g/dL — ABNORMAL LOW (ref 3.5–5.0)
Alkaline Phosphatase: 61 U/L (ref 38–126)
Anion gap: 9 (ref 5–15)
BUN: 13 mg/dL (ref 8–23)
CO2: 27 mmol/L (ref 22–32)
Calcium: 10.8 mg/dL — ABNORMAL HIGH (ref 8.9–10.3)
Chloride: 102 mmol/L (ref 98–111)
Creatinine: 1.04 mg/dL (ref 0.61–1.24)
GFR, Estimated: >60 mL/min (ref >60)
Glucose, Bld: 103 mg/dL — ABNORMAL HIGH (ref 70–99)
Potassium: 4.3 mmol/L (ref 3.5–5.1)
Sodium: 138 mmol/L (ref 135–145)
Total Bilirubin: 0.7 mg/dL (ref 0.0–1.2)
Total Protein: 8.4 g/dL — ABNORMAL HIGH (ref 6.5–8.1)

## 2023-12-30 LAB — URIC ACID: Uric Acid, Serum: 7.3 mg/dL (ref 3.7–8.6)

## 2023-12-30 LAB — CBC WITH DIFFERENTIAL (CANCER CENTER ONLY)
Abs Immature Granulocytes: 0.06 K/uL (ref 0.00–0.07)
Basophils Absolute: 0.1 K/uL (ref 0.0–0.1)
Basophils Relative: 1 %
Eosinophils Absolute: 0.2 K/uL (ref 0.0–0.5)
Eosinophils Relative: 3 %
HCT: 38.3 % — ABNORMAL LOW (ref 39.0–52.0)
Hemoglobin: 12.1 g/dL — ABNORMAL LOW (ref 13.0–17.0)
Immature Granulocytes: 1 %
Lymphocytes Relative: 20 %
Lymphs Abs: 1.7 K/uL (ref 0.7–4.0)
MCH: 28.9 pg (ref 26.0–34.0)
MCHC: 31.6 g/dL (ref 30.0–36.0)
MCV: 91.4 fL (ref 80.0–100.0)
Monocytes Absolute: 0.9 K/uL (ref 0.1–1.0)
Monocytes Relative: 11 %
Neutro Abs: 5.2 K/uL (ref 1.7–7.7)
Neutrophils Relative %: 64 %
Platelet Count: 398 K/uL (ref 150–400)
RBC: 4.19 MIL/uL — ABNORMAL LOW (ref 4.22–5.81)
RDW: 12.9 % (ref 11.5–15.5)
WBC Count: 8.2 K/uL (ref 4.0–10.5)
nRBC: 0 % (ref 0.0–0.2)

## 2023-12-30 LAB — MAGNESIUM: Magnesium: 2.3 mg/dL (ref 1.7–2.4)

## 2023-12-30 LAB — LACTATE DEHYDROGENASE: LDH: 169 U/L (ref 105–235)

## 2023-12-30 MED ORDER — FAMOTIDINE IN NACL 20-0.9 MG/50ML-% IV SOLN
20.0000 mg | Freq: Once | INTRAVENOUS | Status: AC
  Administered 2023-12-30: 20 mg via INTRAVENOUS
  Filled 2023-12-30: qty 50

## 2023-12-30 MED ORDER — ACETAMINOPHEN 325 MG PO TABS
650.0000 mg | ORAL_TABLET | Freq: Once | ORAL | Status: AC
  Administered 2023-12-30: 650 mg via ORAL
  Filled 2023-12-30: qty 2

## 2023-12-30 MED ORDER — DIPHENHYDRAMINE HCL 50 MG/ML IJ SOLN
50.0000 mg | Freq: Once | INTRAMUSCULAR | Status: AC
  Administered 2023-12-30: 50 mg via INTRAVENOUS
  Filled 2023-12-30: qty 1

## 2023-12-30 MED ORDER — METHYLPREDNISOLONE SODIUM SUCC 125 MG IJ SOLR
125.0000 mg | Freq: Once | INTRAMUSCULAR | Status: AC
  Administered 2023-12-30: 125 mg via INTRAVENOUS
  Filled 2023-12-30: qty 2

## 2023-12-30 MED ORDER — DIPHENHYDRAMINE HCL 50 MG/ML IJ SOLN: 50.0000 mg | Freq: Once | INTRAMUSCULAR | Status: DC

## 2023-12-30 MED ORDER — SODIUM CHLORIDE 0.9 % IV SOLN: INTRAVENOUS | Status: DC

## 2023-12-30 MED ORDER — SODIUM CHLORIDE 0.9 % IV SOLN
375.0000 mg/m2 | Freq: Once | INTRAVENOUS | Status: AC
  Administered 2023-12-30: 900 mg via INTRAVENOUS
  Filled 2023-12-30: qty 50

## 2023-12-30 MED ORDER — MORPHINE SULFATE (PF) 2 MG/ML IV SOLN
2.0000 mg | Freq: Once | INTRAVENOUS | Status: AC
  Administered 2023-12-30: 2 mg via INTRAVENOUS
  Filled 2023-12-30: qty 1

## 2023-12-30 NOTE — Progress Notes (Signed)
 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   ASSESSMENT & PLAN:   [facility] Newton is a 63 y.o. gentleman with a past medical history of hypertension, PUD, MALT lymphoma originally diagnosed in 2006, previously untreated, was admitted to [facility] on 12/16/2023 after he presented with shortness of breath and was found to have a pleural effusion, presumed to be from MALT lymphoma progression.  MALT lymphoma (HCC) Please review oncology history for additional details and timeline of events.  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  He was lost to follow-up with oncology at outside facility.  Hospitalized on 12/16/2023 with progressive shortness of breath and was found to have significant right-sided pleural effusion that needed PleurX catheter placement with drainage.  Overall clinical picture was consistent with progression of MALT lymphoma driving the pleural effusions.  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 Night sweats are likely due to the lymphoma and may persist post-treatment. Hypercalcemia is likely a direct consequence of the lymphoma. Pleural effusion is attributed to the lymphoma, with small fluid still present in the right base. Abdominal tumor in the terminal ileum measures approximately 6 cm.  Radiation therapy is avoided due to potential bowel complications. Rituximab  is expected to shrink the abdominal tumor.   He was started on rituximab  treatment on 12/24/2023 when he was in the hospital.  He had infusion reaction with severe rigors, which necessitated additional allergy medicines and morphine .  Following this intervention, he was able to successfully complete rituximab  and did not experience any other issues.  Due for dose #2 of rituximab  today.  Labs showed no dose-limiting toxicities.  Will proceed with rituximab  at a slow rate with extra allergy medicines before and also additional Benadryl  halfway through the infusion.  Plan to continue rituximab  weekly for up to 4 doses.  -He has a CT scan of the chest scheduled next Thursday to assess fluid status, followed by appointment with his pulmonologist on 01/12/2024.  He may undergo repeat thoracentesis depending on CT findings and symptoms.  - Continue to monitor calcium levels due to fluctuations.  RTC in 1 week for labs, office visit and continuation of rituximab  treatments.   I reviewed lab results and outside records for this visit and discussed relevant results with the patient. Diagnosis, plan of care and treatment options were also discussed in detail with the patient. Opportunity provided to ask questions and answers provided to his apparent satisfaction. Provided instructions to call our clinic with any problems, questions or concerns prior to return visit. I recommended to continue follow-up with PCP and sub-specialists. He verbalized understanding and agreed with the plan. No barriers to learning was detected.  NCCN guidelines have been consulted in the planning of this patient's care.  [name], MD  12/30/2023 11:38 AM  [facility] [place] - A DEPT OF [place] [phone]  [place]  [address] [address] Dept: [phone] Dept Fax: [phone]    HISTORY OF PRESENTING ILLNESS:   I have reviewed his chart and materials related to his cancer extensively and collaborated history with the patient. Summary of oncologic history is as follows:  ONCOLOGY HISTORY:  He has been following up with his PCP at [facility].  [facility] with exertional shortness of breath/bronchospasm.  Chest x-ray at that time showed right upper lobe lung mass.   He has remote history of MALT lymphoma in 2006, for which he was on surveillance only.  Given chest x-ray findings of right upper lobe lung mass, CT chest with contrast was obtained by his PCP and on 10/28/2023 showed large right-sided pleural effusion, multifocal areas of consolidation in the right lung including a masslike area in the right apex concerning for malignancy.  Other areas were more suggestive of consolidation and/or atelectasis.  Multifocal smaller opacities in the left lung.  Findings concerning for neoplasm or infection.  Small mediastinal lymph nodes.  Prominent right diaphragmatic lymph node.  With these findings, he was referred to pulmonology.  On review of records, he has had right upper lobe lung nodule since 2013 and was lost to follow-up.  He was seen by Dr. Javaid on 11/03/2023 and plan made for thoracentesis and PET scan.   On 11/09/2023, he presented to [facility] health ED and Dr. Toribio Sharps with pulmonology performed thoracentesis.  Pleural fluid cytology showed no malignant cells.  Showed findings consistent with chronic inflammation.   On 12/01/2023, staging PET scan at [facility] showed hypermetabolic bilateral consolidation or neoplasm. The changes are increased compared to 10/28/2023. Consider bronchoscopy for further evaluation. Large right effusion with one area of hypermetabolic activity posteriorly. Hypermetabolic distal ileal mass. Possibly lymphoma Hypermetabolic mesenteric nodes.    Since clinical picture remained concerning for  malignancy, additional workup was planned in the outpatient setting.  Patient was seen in pulmonology clinic by Dr. Tamala Patch on 12/15/2023.  Since he did not have significant improvement after previous thoracentesis, recommended ER evaluation for admission/further management including chest tube placement.    On 12/16/2023, he underwent pleural catheter insertion.  On 12/18/2023, Dr. Toribio Sharps performed flexible bronchoscopy, EBUS and biopsy.  He had unique anatomy and had to undergo transbronchial biopsy by EBUS along apical wall of right upper lobe.  Pathology from FNA from right upper lobe lung nodule/lymph node came back positive for malignant cells.  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  This is consistent with patient's history of MALT lymphoma.  Hence we were consulted for additional recommendations.   Reviewed CT chest abdomen pelvis from 12/17/2023.  It showed diminished volume of a loculated right pleural effusion containing numerous small air loculations, pigtail catheter with formed pigtail in the dependent right pleural space. Very dense consolidation of the right upper lobe, not significantly changed, with additional very dense consolidation of the right middle lobe also not significantly changed. There is however somewhat improved aeration of the right lower lobe secondary to diminished effusion volume. Additional dense consolidation of the posterior suprahilar left upper lobe is not significantly changed, nor are numerous scattered irregular opacities throughout the remainder of the aerated portions of the lungs. Constellation of pulmonary findings is suggestive of pulmonary lymphomatous involvement especially inasmuch as right middle lobe consolidation was present on a relatively remote CT examination of the abdomen pelvis dated 2024. Unchanged enlarged mediastinal lymph nodes. Similar  circumferential mass of the terminal ileum  measuring 6.1 x 3.5 x 3.3 cm, in keeping with MALT lymphoma. Pancolonic diverticulosis without evidence of acute diverticulitis.   Clinical picture was consistent with  MALT lymphoma, which was previously untreated but now is causing complications including driving pleural effusions.  This warranted treatment initiation.     Since patient remained symptomatic from pleural effusion and it seemed to arise from MALT lymphoma, inpatient chemotherapy was initiated with rituximab , starting from 12/24/2023.  He had infusion reaction with severe rigors, necessitating additional allergy medications and morphine .  He was able to finish rituximab  infusion with this intervention and remained asymptomatic afterwards.   Plan to continue rituximab  weekly for 4 cycles.  Oncology History  MALT lymphoma (HCC)  05/23/2011 Initial Diagnosis   MALT lymphoma (HCC)   12/24/2023 -  Chemotherapy   Patient is on Treatment Plan : NON-HODGKINS LYMPHOMA Rituximab  q7d     12/30/2023 Cancer Staging   Staging form: Hodgkin and Non-Hodgkin Lymphoma, AJCC 8th Edition - Clinical: Stage III (Marginal zone lymphoma) - Signed by Autumn Millman, MD on 12/30/2023     INTERVAL HISTORY:  Discussed the use of AI scribe software for clinical note transcription with the patient, who gave verbal consent to proceed.  History of Present Illness Alyx Gee is a 63 year old male with lymphoma who presents with night sweats and follow-up for treatment.  He experiences persistent night sweats, severe enough to require changing sheets and towels during the night. Using a diffuser with new fragrances has slightly reduced the severity of the sweating. These symptoms were also present during his hospital stay. No breathing difficulties, lightheadedness, dizziness, cough, or chest pain. He is able to perform normal activities, such as going to the supermarket, without limitations.  He mentions a pleural effusion and a tumor in the  abdomen measuring about six centimeters located in the terminal ileum. He is scheduled for a CT scan of the chest next Thursday to assess the fluid status and tumor response.   MEDICAL HISTORY:  Past Medical History:  Diagnosis Date   Anemia    Bleeding gastric ulcer    Blood transfusion    H. 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   s/p 14 days Nexium, clarithromycin and amoxicillin     SURGICAL HISTORY: Past Surgical History:  Procedure Laterality Date   BRONCHIAL NEEDLE ASPIRATION BIOPSY  12/18/2023   Procedure: BRONCHOSCOPY, WITH NEEDLE ASPIRATION BIOPSY;  Surgeon: Claudene Toribio BROCKS, MD;  Location: [facility] ENDOSCOPY;  Service: Pulmonary;;   HYDROCELE EXCISION Bilateral 12/27/2022   Procedure: BILATERAL HYDROCELECTOMY ADULT;  Surgeon: Alvaro Ricardo KATHEE Mickey., MD;  Location: [facility] ORS;  Service: Urology;  Laterality: Bilateral;   ORCHIOPEXY Bilateral 12/27/2022   Procedure: BILATERAL ORCHIOPEXY ADULT;  Surgeon: Alvaro Ricardo KATHEE Mickey., MD;  Location: [facility] ORS;  Service: Urology;  Laterality: Bilateral;  120 MINS FOR CASE   UPPER GASTROINTESTINAL ENDOSCOPY  2006, 2013   VIDEO BRONCHOSCOPY WITH ENDOBRONCHIAL ULTRASOUND Right 12/18/2023   Procedure: BRONCHOSCOPY, WITH EBUS;  Surgeon: Claudene Toribio BROCKS, MD;  Location: [facility] ENDOSCOPY;  Service: Pulmonary;  Laterality: Right;    SOCIAL HISTORY: He reports that he quit smoking about 22 years ago. His smoking use included cigarettes. He started smoking about 27 years ago. He has a 5 pack-year smoking history. He has quit using smokeless tobacco. He reports that he does not drink alcohol and does not use drugs.  Social History   Socioeconomic History   Marital status: Married    Spouse name: Not on file   Number of children: 2   Years of education: Not on file   Highest education level: Not on file  Occupational History    Employer: [id number]  Tobacco Use   Smoking  status: Former    Current packs/day: 0.00    Average packs/day: 1 pack/day for 5.0 years (5.0 ttl pk-yrs)    Types: Cigarettes    Start date: 03/18/1996    Quit date: 03/18/2001    Years since quitting: 22.8   Smokeless tobacco: Former  Building Services Engineer status: Never Used  Substance and Sexual Activity   Alcohol use: No   Drug use: No   Sexual activity: Yes  Other Topics Concern   Not on file  Social History Narrative   Not on file   Social Drivers of Health   Financial Resource Strain: Low Risk  (04/03/2023)   Received from [facility]   Overall Financial Resource Strain (CARDIA)    Difficulty of Paying Living Expenses: Not hard at all  Food Insecurity: No Food Insecurity (12/30/2023)   Hunger Vital Sign    Worried About Running Out of Food in the Last Year: Never true    Ran Out of Food in the Last Year: Never true  Transportation Needs: No Transportation Needs (12/30/2023)   PRAPARE - Administrator, Civil Service (Medical): No    Lack of Transportation (Non-Medical): No  Physical Activity: Sufficiently Active (01/24/2021)   Received from [facility]   Exercise Vital Sign    On average, how many days per week do you engage in moderate to strenuous exercise (like a brisk walk)?: 5 days    On average, how many minutes do you engage in exercise at this level?: 60 min  Stress: No Stress Concern Present (01/24/2021)   Received from [facility] - Occupational Stress Questionnaire    Feeling of Stress : Not at all  Social Connections: Moderately Isolated (01/24/2021)   Received from [facility]   Social Connection and Isolation Panel    In a typical week, how many times do you talk on the phone with family, friends, or neighbors?: More than three times a week    How often do you get together with friends or relatives?: Never    How often do you attend church or religious services?: Never    Do you belong to any  clubs or organizations such as church groups, unions, fraternal or athletic groups, or school groups?: Yes    How often do you attend meetings of the clubs or organizations you belong to?: Never    Are you married, widowed, divorced, separated, never married, or living with a partner?: Never married  Intimate Partner Violence: Not At Risk (12/30/2023)   Humiliation, Afraid, Rape, and Kick questionnaire    Fear of Current or Ex-Partner: No    Emotionally Abused: No    Physically Abused: No    Sexually Abused: No    FAMILY HISTORY: Family History  Problem Relation Age of Onset   Colon cancer Neg Hx    Diabetes Neg Hx    Heart disease Neg Hx     ALLERGIES:  He is allergic to rituximab .  MEDICATIONS:  Current Outpatient Medications  Medication Sig Dispense Refill   acetaminophen  (TYLENOL ) 325 MG tablet Take 2 tablets (650 mg total) by mouth  every 6 (six) hours as needed for mild pain (pain score 1-3) or fever (or Fever >/= 101).     albuterol  (VENTOLIN  HFA) 108 (90 Base) MCG/ACT inhaler Inhale 2 puffs into the lungs.     lisinopril  (ZESTRIL ) 20 MG tablet Take 30 mg by mouth daily.     oxyCODONE -acetaminophen  (PERCOCET) 5-325 MG tablet Take 1 tablet by mouth every 6 (six) hours as needed. 12 tablet 0   No current facility-administered medications for this visit.   Facility-Administered Medications Ordered in Other Visits  Medication Dose Route Frequency Provider Last Rate Last Admin   0.9 %  sodium chloride  infusion   Intravenous Continuous Elisabet Gutzmer, MD 10 mL/hr at 12/30/23 0935 New Bag at 12/30/23 0935   diphenhydrAMINE  (BENADRYL ) injection 50 mg  50 mg Intravenous Once Mathew Postiglione, MD        REVIEW OF SYSTEMS:    Review of Systems - Oncology  All other pertinent systems were reviewed with the patient and are negative.  PHYSICAL EXAMINATION:    Onc Performance Status - 12/30/23 0900       KPS SCALE   KPS % SCORE Normal, no compliants, no evidence of disease           Vitals:   12/30/23 0846  BP: 133/86  Pulse: 80  Resp: 18  Temp: 97.9 F (36.6 C)  SpO2: 96%   Filed Weights   12/30/23 0846  Weight: 233 lb 9.6 oz (106 kg)    Physical Exam Constitutional:      General: He is not in acute distress.    Appearance: Normal appearance.  HENT:     Head: Normocephalic and atraumatic.  Eyes:     Conjunctiva/sclera: Conjunctivae normal.  Cardiovascular:     Rate and Rhythm: Normal rate and regular rhythm.  Pulmonary:     Effort: Pulmonary effort is normal. No respiratory distress.     Comments: Decreased breath sounds in the right base.  Clear to auscultation elsewhere Abdominal:     General: There is no distension.  Lymphadenopathy:     Cervical: No cervical adenopathy.  Neurological:     General: No focal deficit present.     Mental Status: He is alert and oriented to person, place, and time.  Psychiatric:        Mood and Affect: Mood normal.        Behavior: Behavior normal.      LABORATORY DATA:   I have reviewed the data as listed.  Results for orders placed or performed in visit on 12/30/23  Uric acid  Result Value Ref Range   Uric Acid, Serum 7.3 3.7 - 8.6 mg/dL  Magnesium  Result Value Ref Range   Magnesium 2.3 1.7 - 2.4 mg/dL  Lactate dehydrogenase  Result Value Ref Range   LDH 169 105 - 235 U/L  CMP (Cancer Center only)  Result Value Ref Range   Sodium 138 135 - 145 mmol/L   Potassium 4.3 3.5 - 5.1 mmol/L   Chloride 102 98 - 111 mmol/L   CO2 27 22 - 32 mmol/L   Glucose, Bld 103 (H) 70 - 99 mg/dL   BUN 13 8 - 23 mg/dL   Creatinine 8.95 9.38 - 1.24 mg/dL   Calcium 89.1 (H) 8.9 - 10.3 mg/dL   Total Protein 8.4 (H) 6.5 - 8.1 g/dL   Albumin 3.3 (L) 3.5 - 5.0 g/dL   AST 24 15 - 41 U/L   ALT 35 0 - 44 U/L  Alkaline Phosphatase 61 38 - 126 U/L   Total Bilirubin 0.7 0.0 - 1.2 mg/dL   GFR, Estimated >39 >39 mL/min   Anion gap 9 5 - 15  CBC with Differential (Cancer Center Only)  Result Value Ref Range    WBC Count 8.2 4.0 - 10.5 K/uL   RBC 4.19 (L) 4.22 - 5.81 MIL/uL   Hemoglobin 12.1 (L) 13.0 - 17.0 g/dL   HCT 61.6 (L) 60.9 - 47.9 %   MCV 91.4 80.0 - 100.0 fL   MCH 28.9 26.0 - 34.0 pg   MCHC 31.6 30.0 - 36.0 g/dL   RDW 87.0 88.4 - 84.4 %   Platelet Count 398 150 - 400 K/uL   nRBC 0.0 0.0 - 0.2 %   Neutrophils Relative % 64 %   Neutro Abs 5.2 1.7 - 7.7 K/uL   Lymphocytes Relative 20 %   Lymphs Abs 1.7 0.7 - 4.0 K/uL   Monocytes Relative 11 %   Monocytes Absolute 0.9 0.1 - 1.0 K/uL   Eosinophils Relative 3 %   Eosinophils Absolute 0.2 0.0 - 0.5 K/uL   Basophils Relative 1 %   Basophils Absolute 0.1 0.0 - 0.1 K/uL   Immature Granulocytes 1 %   Abs Immature Granulocytes 0.06 0.00 - 0.07 K/uL     RADIOGRAPHIC STUDIES:  I have personally reviewed the radiological images as listed and agree with the findings in the report.  DG CHEST PORT 1 VIEW Result Date: 12/23/2023 EXAM: 1 VIEW(S) XRAY OF THE CHEST _study_datetime_ COMPARISON: 12/21/2023 CLINICAL HISTORY: Chest tube in place. FINDINGS: LINES, TUBES AND DEVICES: Right sided pigtail catheter is unchanged in position. LUNGS AND PLEURA: Increased pleural parenchymal opacification throughout the majority of the right hemithorax. Pleural air-fluid levels are less distinct today. Chronic interstitial thickening without left sided pulmonary opacity. HEART AND MEDIASTINUM: No acute abnormality of the cardiac and mediastinal silhouettes. BONES AND SOFT TISSUES: No acute osseous abnormality. IMPRESSION: 1. Worsened right sided aeration, likely due to a combination of pleural fluid, possible pleural air, and airspace disease. 2. Right sided pleural pigtail catheter remains in place. Electronically signed by: Rockey Kilts MD 12/23/2023 12:05 PM EST RP Workstation: [id number]   DG Chest Port 1 View Result Date: 12/21/2023 CLINICAL DATA:  Pleural effusion. EXAM: PORTABLE CHEST 1 VIEW COMPARISON:  Radiographs 12/19/2023 and 12/17/2023.  CT  12/17/2023. FINDINGS: 0904 hours. Right basilar pigtail catheter is unchanged in position. No significant change in the small right pleural effusion with possible pleural air-fluid levels of this erect examination. The underlying airspace disease throughout the right lung and in the left perihilar region has not significantly changed. New left-sided pleural effusion or pneumothorax. The heart size and mediastinal contours stable. IMPRESSION: 1. No significant change in small right pleural effusion with possible pleural air-fluid levels. Stable right basilar pigtail catheter. 2. Stable bilateral airspace disease. Electronically Signed   By: Elsie Perone M.D.   On: 12/21/2023 13:12   DG CHEST PORT 1 VIEW Result Date: 12/19/2023 CLINICAL DATA:  Follow-up right chest tube and pleural effusion. EXAM: PORTABLE CHEST 1 VIEW COMPARISON:  12/17/2023 FINDINGS: Stable right basilar pigtail pleural catheter. Mildly improved aeration of the right lung with slightly less pleural fluid and consolidation. Clear left lung. Mild peribronchial thickening. Normal-sized heart. Stable right glenoid cystic areas. IMPRESSION: 1. Mildly improved aeration of the right lung with slightly less pleural fluid and consolidation. 2. Stable right basilar pigtail pleural catheter. 3. Mild bronchitic changes. Electronically Signed   By: Elspeth Bathe  M.D.   On: 12/19/2023 18:18   CT CHEST ABDOMEN PELVIS W CONTRAST Result Date: 12/17/2023 CLINICAL DATA:  Hematologic malignancy staging, history of MALT, lung mass with worsening effusion * Tracking Code: BO * EXAM: CT CHEST, ABDOMEN, AND PELVIS WITH CONTRAST TECHNIQUE: Multidetector CT imaging of the chest, abdomen and pelvis was performed following the standard protocol during bolus administration of intravenous contrast. RADIATION DOSE REDUCTION: This exam was performed according to the departmental dose-optimization program which includes automated exposure control, adjustment of the mA  and/or kV according to patient size and/or use of iterative reconstruction technique. CONTRAST:  75mL OMNIPAQUE  IOHEXOL  350 MG/ML SOLN COMPARISON:  CT chest angiogram, 11/09/2023, CT abdomen pelvis, 12/26/2022 FINDINGS: CT CHEST FINDINGS Cardiovascular: No significant vascular findings. Normal heart size. No pericardial effusion. Mediastinum/Nodes: Unchanged enlarged mediastinal lymph nodes, pretracheal nodes measuring up to 2.4 x 1.1 cm (series 2, image 19). Thyroid gland, trachea, and esophagus demonstrate no significant findings. Lungs/Pleura: Diminished volume of a loculated right pleural effusion containing numerous small air loculations, pigtail catheter with formed pigtail in the dependent right pleural space (series 2, image 47). Very dense consolidation of the right upper lobe, not significantly changed, with additional very dense consolidation of the right middle lobe also not significantly changed. There is however somewhat improved aeration of the right lower lobe secondary to diminished effusion volume. Additional dense consolidation of the posterior suprahilar left upper lobe is not significantly changed, nor are numerous scattered irregular opacities throughout the remainder of the aerated portions of the lungs (series 4, image 40, 18). Trace left pleural effusion. Musculoskeletal: No chest wall abnormality. No acute osseous findings. CT ABDOMEN PELVIS FINDINGS Hepatobiliary: No solid liver abnormality is seen. Contracted gallbladder. No gallstones, gallbladder wall thickening, or biliary dilatation. Pancreas: Unremarkable. No pancreatic ductal dilatation or surrounding inflammatory changes. Spleen: Normal in size without significant abnormality. Adrenals/Urinary Tract: Adrenal glands are unremarkable. Kidneys are normal, without renal calculi, solid lesion, or hydronephrosis. Bladder is unremarkable. Stomach/Bowel: Stomach is within normal limits. Appendix appears normal. Similar circumferential mass  of the terminal ileum measuring 6.1 x 3.5 x 3.3 cm (series 6, image 65, series 2, image 101). Pancolonic diverticulosis. Vascular/Lymphatic: No significant vascular findings are present. No enlarged abdominal or pelvic lymph nodes. Reproductive: Mild Prostatomegaly. Other: No abdominal wall hernia or abnormality. No ascites. Musculoskeletal: No acute osseous findings. IMPRESSION: 1. Diminished volume of a loculated right pleural effusion containing numerous small air loculations, pigtail catheter with formed pigtail in the dependent right pleural space. 2. Very dense consolidation of the right upper lobe, not significantly changed, with additional very dense consolidation of the right middle lobe also not significantly changed. There is however somewhat improved aeration of the right lower lobe secondary to diminished effusion volume. 3. Additional dense consolidation of the posterior suprahilar left upper lobe is not significantly changed, nor are numerous scattered irregular opacities throughout the remainder of the aerated portions of the lungs. 4. Constellation of pulmonary findings is suggestive of pulmonary lymphomatous involvement especially inasmuch as right middle lobe consolidation was present on a relatively remote CT examination of the abdomen pelvis dated 2024. 5. Unchanged enlarged mediastinal lymph nodes. 6. 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 7. Pancolonic diverticulosis without evidence of acute diverticulitis. Electronically Signed   By: Marolyn JONETTA Jaksch M.D.   On: 12/17/2023 13:16   DG Chest Port 1 View Result Date: 12/17/2023 EXAM: 1 VIEW(S) XRAY OF THE CHEST 12/17/2023 06:52:00  AM COMPARISON: AP radiograph of the chest dated 12/16/2023. CLINICAL HISTORY: [id number] Chest tube in place [id number] Chest tube in place FINDINGS: LINES, TUBES AND DEVICES: A pigtail catheter is again seen projecting over the right lung base. LUNGS  AND PLEURA: Interval improved aeration of the right lung, particularly the right mid lung zone. A right-sided pleural effusion has likely decreased in the interim. The left lung appears clear. No focal pulmonary opacity. No pulmonary edema. No pneumothorax. HEART AND MEDIASTINUM: No acute abnormality of the cardiac and mediastinal silhouettes. BONES AND SOFT TISSUES: No acute osseous abnormality. IMPRESSION: 1. Interval improved aeration of the right lung, particularly the right mid lung zone. 2. Likely decreased right-sided pleural effusion. 3. Pigtail catheter in place over the right lung base. Electronically signed by: Evalene Coho MD 12/17/2023 06:57 AM EST RP Workstation: [id number]   DG Chest 2 View Result Date: 12/16/2023 EXAM: 2 VIEW(S) XRAY OF THE CHEST 12/16/2023 01:55:00 PM COMPARISON: 11/09/2023 CLINICAL HISTORY: shortness of breath FINDINGS: LUNGS AND PLEURA: Complete opacification of right hemithorax, significantly worsened since prior study. Persistent masslike airspace opacity in the left suprahilar region. No left pleural effusion. No pneumothorax. HEART AND MEDIASTINUM: No acute abnormality of the cardiac and mediastinal silhouettes. BONES AND SOFT TISSUES: No acute osseous abnormality. IMPRESSION: 1. Complete opacification of the right hemithorax, significantly worsened since the prior study, probably representing a combination of large effusion and compressive atelectasis versus airspace disease. 2. Similarly appearing masslike opacity in the left suprahilar region. Electronically signed by: Rogelia Myers MD 12/16/2023 02:18 PM EST RP Workstation: [id number]   DG Chest Port 1 View Result Date: 12/16/2023 EXAM: 1 VIEW(S) XRAY OF THE CHEST 12/16/2023 01:16:18 PM COMPARISON: 11/09/2023 CLINICAL HISTORY: Chest tube in place. FINDINGS: LINES, TUBES AND DEVICES: Right chest tube with tip terminating along the right lung base. LUNGS AND PLEURA: Persistent opacification of right hemithorax,  despite the chest tube. Known left perihilar mass. No pulmonary edema. No pneumothorax. HEART AND MEDIASTINUM: Atherosclerotic calcifications. No acute abnormality of the cardiac and mediastinal silhouettes. BONES AND SOFT TISSUES: No acute osseous abnormality. IMPRESSION: 1. Persistent opacification of the right hemithorax despite the chest tube. 2. Persistent left perihilar density Electronically signed by: Maude Stammer MD 12/16/2023 01:43 PM EST RP Workstation: [id number]    No orders of the defined types were placed in this encounter.   CODE STATUS:  Code Status History     Date Active Date Inactive Code Status Order ID Comments User Context   12/16/2023 1305 12/25/2023 2101 Full Code [id number]  Seena Marsa NOVAK, MD ED    Questions for Most Recent Historical Code Status (Order [id number])     Question Answer   By: Consent: discussion documented in EHR            Future Appointments  Date Time Provider Department Center  01/05/2024  8:00 AM [facility] PHLEBOTOMIST [facility] None  01/05/2024  9:00 AM Terrius Gentile, MD [facility] None  01/05/2024 10:00 AM [facility] INFUSION [facility] None  01/08/2024  8:30 AM [facility] 2 [facility] [place]  01/12/2024 11:00 AM Adrien Winfred Berke, MD [facility] [address]  01/13/2024  8:15 AM [facility] PHLEBOTOMIST [facility] None  01/13/2024  8:30 AM Markayla Reichart, Chinita, MD [facility] None  01/13/2024  9:00 AM [facility] INFUSION [facility] None  01/20/2024  8:30 AM [facility] PHLEBOTOMIST [facility] None  01/20/2024  9:00 AM Elida Harbin, Chinita, MD [facility] None  01/20/2024  9:30 AM [facility] INFUSION [facility] None     I spent a total of 50 minutes during  this encounter with the patient including review of chart and various tests results, discussions about plan of care and coordination of care plan.  This document was completed utilizing speech recognition software. Grammatical errors, random word insertions, pronoun errors, and incomplete sentences are Timothy  occasional consequence of this system due to software limitations, ambient noise, and hardware issues. Any formal questions or concerns about the content, text or information contained within the body of this dictation should be directly addressed to the provider for clarification.

## 2023-12-30 NOTE — Assessment & Plan Note (Addendum)
 Please review oncology history for additional details and timeline of events.  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  He was lost to follow-up with oncology at outside facility.  Hospitalized on 12/16/2023 with progressive shortness of breath and was found to have significant right-sided pleural effusion that needed PleurX catheter placement with drainage.  Overall clinical picture was consistent with progression of MALT lymphoma driving the pleural effusions.  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 Night sweats are likely due to the lymphoma and may persist post-treatment. Hypercalcemia is likely a direct consequence of the lymphoma. Pleural effusion is attributed to the lymphoma, with small fluid still present in the right base. Abdominal tumor in the terminal ileum measures approximately 6 cm. Radiation therapy is avoided due to potential bowel complications. Rituximab  is expected to shrink the abdominal tumor.   He was started on rituximab  treatment on 12/24/2023 when he was in the hospital.  He had infusion reaction with severe rigors, which necessitated additional allergy medicines and morphine .  Following this intervention, he was able to successfully complete rituximab  and did not experience any other issues.  Due for dose #2 of rituximab  today.  Labs showed no dose-limiting toxicities.  Will proceed with rituximab  at a slow rate with extra allergy medicines before and also additional Benadryl  halfway through the infusion.  Plan to continue rituximab  weekly for up to 4 doses.  -He has a CT scan of the chest scheduled next Thursday to assess fluid status, followed by appointment with his pulmonologist on 01/12/2024.  He may undergo repeat thoracentesis depending on CT findings and symptoms.  - Continue to monitor calcium levels due to fluctuations.  RTC in 1 week for  labs, office visit and continuation of rituximab  treatments.

## 2023-12-30 NOTE — Patient Instructions (Signed)
Rituximab; Hyaluronidase Injection What is this medication? RITUXIMAB; HYALURONIDASE (ri TUX i mab; hye al ur ON i dase) treats leukemia and lymphoma. Rituximab works by blocking a protein that causes cancer cells to grow and multiply. This helps to slow or stop the spread of cancer cells. Hyaluronidase works by increasing the absorption of the other medications in the body to help them work better. It is a combination medication that contains a monoclonal antibody. This medicine may be used for other purposes; ask your health care provider or pharmacist if you have questions. COMMON BRAND NAME(S): Rituxan Hycela What should I tell my care team before I take this medication? They need to know if you have any of these conditions: Heart disease Immune system problems Infection, such as hepatitis B, chickenpox, cold sores, herpes Irregular heartbeat Kidney disease Lung or breathing disease, such as asthma Recently received or scheduled to receive a vaccine An unusual or allergic reaction to rituximab, rituximab;hyaluronidase, mouse proteins, other medications, foods, dyes, or preservatives Pregnant or trying to get pregnant Breast-feeding How should I use this medication? This medication is for injection under the skin. It is given by a care team in a hospital or clinic setting. A special MedGuide will be given to you before each treatment. Be sure to read this information carefully each time. Talk to your care team about the use of this medication in children. Special care may be needed. Overdosage: If you think you have taken too much of this medicine contact a poison control center or emergency room at once. NOTE: This medicine is only for you. Do not share this medicine with others. What if I miss a dose? Keep appointments for follow-up doses. It is important not to miss your dose. Call your care team if you are unable to keep an appointment. What may interact with this medication? Do not  take this medication with any of the following: Live virus vaccines This medication may also interact with the following: Cisplatin This list may not describe all possible interactions. Give your health care provider a list of all the medicines, herbs, non-prescription drugs, or dietary supplements you use. Also tell them if you smoke, drink alcohol, or use illegal drugs. Some items may interact with your medicine. What should I watch for while using this medication? Your condition will be monitored carefully while you are receiving this medication. You may need blood work while taking this medication. This medication can cause serious allergic reactions. To reduce the risk, your care team may give you other medications to take before receiving this one. Be sure to follow the directions from your care team. In some patients, this medication may cause a serious brain infection that may cause death. If you have any problems seeing, thinking, speaking, walking, or standing, tell your care team right away. If you cannot reach your care team, urgently seek other source of medical care. This medication may increase your risk of getting an infection. Call your care team for advice if you get a fever, chills, sore throat, or other symptoms of a cold or flu. Do not treat yourself. Try to avoid being around people who are sick. Talk to your care team if you may be pregnant. Serious birth defects can occur if you take this medication during pregnancy and for 12 months after the last dose. Your will need a negative pregnancy test before starting this medication. Contraception is recommended while taking this medication and for 12 months after the last dose. Your care  team can help you find the option that works for you. Do not breastfeed while taking this medication and for at least 6 months after the last dose. What side effects may I notice from receiving this medication? Side effects that you should report to  your care team as soon as possible: Allergic reactions or angioedema--skin rash, itching or hives, swelling of the face, eyes, lips, tongue, arms, or legs, trouble swallowing or breathing Bowel blockage--stomach cramping, unable to have a bowel movement or pass gas, loss of appetite, vomiting Dizziness, loss of balance or coordination, confusion or trouble speaking Fever, chills, unusual weakness or fatigue, loss of appetite, nausea, headache, dizziness, feeling faint or lightheaded, shortness of breath, fast or irregular heartbeat, which may be signs of cytokine release syndrome Heart attack--pain or tightness in the chest, shoulders, arms, or jaw, nausea, shortness of breath, cold or clammy skin, feeling faint or lightheaded Heart rhythm changes--fast or irregular heartbeat, dizziness, feeling faint or lightheaded, chest pain, trouble breathing Infection--fever, chills, cough, sore throat, wounds that don't heal, pain or trouble when passing urine, general feeling of discomfort or being unwell Kidney injury--decrease in the amount of urine, swelling of the ankles, hands, or feet Liver injury--right upper belly pain, loss of appetite, nausea, light-colored stool, dark yellow or brown urine, yellowing skin or eyes, unusual weakness or fatigue Redness, blistering, peeling, or loosening of the skin, including inside the mouth Stomach pain that is severe, does not go away, or gets worse Tumor lysis syndrome (TLS)--nausea, vomiting, diarrhea, decrease in the amount of urine, dark urine, unusual weakness or fatigue, confusion, muscle pain or cramps, fast or irregular heartbeat, joint pain Side effects that usually do not require medical attention (report these to your care team if they continue or are bothersome): Constipation Fatigue Hair loss Nausea Pain, redness, or irritation at injection site This list may not describe all possible side effects. Call your doctor for medical advice about side  effects. You may report side effects to FDA at 1-800-FDA-1088. Where should I keep my medication? This medication is given in a hospital or clinic. It will not be stored at home. NOTE: This sheet is a summary. It may not cover all possible information. If you have questions about this medicine, talk to your doctor, pharmacist, or health care provider.  2024 Elsevier/Gold Standard (2021-06-14 00:00:00)

## 2023-12-30 NOTE — Progress Notes (Signed)
 Patient seen by Dr. Archie Patten Pasam today  Vitals are within treatment parameters:Yes   Labs are within treatment parameters: Yes   Treatment plan has been signed: Yes   Per physician team, Patient is ready for treatment and there are NO modifications to the treatment plan.

## 2023-12-31 ENCOUNTER — Encounter: Payer: Self-pay | Admitting: Oncology

## 2023-12-31 ENCOUNTER — Other Ambulatory Visit: Payer: Self-pay

## 2023-12-31 DIAGNOSIS — C884 Extranodal marginal zone b-cell lymphoma of mucosa-associated lymphoid tissue (malt-lymphoma) not having achieved remission: Secondary | ICD-10-CM

## 2024-01-05 ENCOUNTER — Inpatient Hospital Stay

## 2024-01-05 ENCOUNTER — Other Ambulatory Visit: Payer: Self-pay

## 2024-01-05 ENCOUNTER — Inpatient Hospital Stay: Admitting: Oncology

## 2024-01-05 ENCOUNTER — Encounter: Payer: Self-pay | Admitting: Oncology

## 2024-01-05 VITALS — BP 127/90 | HR 88 | Temp 98.9°F | Resp 16 | Wt 234.1 lb

## 2024-01-05 VITALS — BP 143/84 | HR 87 | Temp 98.2°F | Resp 18

## 2024-01-05 DIAGNOSIS — J910 Malignant pleural effusion: Secondary | ICD-10-CM | POA: Insufficient documentation

## 2024-01-05 DIAGNOSIS — I10 Essential (primary) hypertension: Secondary | ICD-10-CM | POA: Diagnosis not present

## 2024-01-05 DIAGNOSIS — J948 Other specified pleural conditions: Secondary | ICD-10-CM | POA: Insufficient documentation

## 2024-01-05 DIAGNOSIS — R61 Generalized hyperhidrosis: Secondary | ICD-10-CM | POA: Diagnosis not present

## 2024-01-05 DIAGNOSIS — K5730 Diverticulosis of large intestine without perforation or abscess without bleeding: Secondary | ICD-10-CM | POA: Insufficient documentation

## 2024-01-05 DIAGNOSIS — Z8711 Personal history of peptic ulcer disease: Secondary | ICD-10-CM | POA: Diagnosis not present

## 2024-01-05 DIAGNOSIS — R6889 Other general symptoms and signs: Secondary | ICD-10-CM | POA: Diagnosis not present

## 2024-01-05 DIAGNOSIS — C884 Extranodal marginal zone b-cell lymphoma of mucosa-associated lymphoid tissue (malt-lymphoma) not having achieved remission: Secondary | ICD-10-CM | POA: Insufficient documentation

## 2024-01-05 DIAGNOSIS — Z5112 Encounter for antineoplastic immunotherapy: Secondary | ICD-10-CM | POA: Diagnosis present

## 2024-01-05 DIAGNOSIS — J9589 Other postprocedural complications and disorders of respiratory system, not elsewhere classified: Secondary | ICD-10-CM | POA: Insufficient documentation

## 2024-01-05 DIAGNOSIS — Z7964 Long term (current) use of myelosuppressive agent: Secondary | ICD-10-CM | POA: Diagnosis not present

## 2024-01-05 DIAGNOSIS — J9801 Acute bronchospasm: Secondary | ICD-10-CM | POA: Insufficient documentation

## 2024-01-05 DIAGNOSIS — J189 Pneumonia, unspecified organism: Secondary | ICD-10-CM | POA: Insufficient documentation

## 2024-01-05 DIAGNOSIS — Z79899 Other long term (current) drug therapy: Secondary | ICD-10-CM | POA: Insufficient documentation

## 2024-01-05 LAB — CBC WITH DIFFERENTIAL (CANCER CENTER ONLY)
Abs Immature Granulocytes: 0.02 K/uL (ref 0.00–0.07)
Basophils Absolute: 0.1 K/uL (ref 0.0–0.1)
Basophils Relative: 2 %
Eosinophils Absolute: 0.1 K/uL (ref 0.0–0.5)
Eosinophils Relative: 2 %
HCT: 40.1 % (ref 39.0–52.0)
Hemoglobin: 12.6 g/dL — ABNORMAL LOW (ref 13.0–17.0)
Immature Granulocytes: 0 %
Lymphocytes Relative: 26 %
Lymphs Abs: 1.6 K/uL (ref 0.7–4.0)
MCH: 29.2 pg (ref 26.0–34.0)
MCHC: 31.4 g/dL (ref 30.0–36.0)
MCV: 92.8 fL (ref 80.0–100.0)
Monocytes Absolute: 0.6 K/uL (ref 0.1–1.0)
Monocytes Relative: 10 %
Neutro Abs: 3.7 K/uL (ref 1.7–7.7)
Neutrophils Relative %: 60 %
Platelet Count: 308 K/uL (ref 150–400)
RBC: 4.32 MIL/uL (ref 4.22–5.81)
RDW: 13.3 % (ref 11.5–15.5)
WBC Count: 6 K/uL (ref 4.0–10.5)
nRBC: 0 % (ref 0.0–0.2)

## 2024-01-05 LAB — CMP (CANCER CENTER ONLY)
ALT: 24 U/L (ref 0–44)
AST: 17 U/L (ref 15–41)
Albumin: 3.6 g/dL (ref 3.5–5.0)
Alkaline Phosphatase: 69 U/L (ref 38–126)
Anion gap: 9 (ref 5–15)
BUN: 12 mg/dL (ref 8–23)
CO2: 28 mmol/L (ref 22–32)
Calcium: 10.8 mg/dL — ABNORMAL HIGH (ref 8.9–10.3)
Chloride: 102 mmol/L (ref 98–111)
Creatinine: 0.96 mg/dL (ref 0.61–1.24)
GFR, Estimated: >60 mL/min (ref >60)
Glucose, Bld: 100 mg/dL — ABNORMAL HIGH (ref 70–99)
Potassium: 4 mmol/L (ref 3.5–5.1)
Sodium: 139 mmol/L (ref 135–145)
Total Bilirubin: 0.8 mg/dL (ref 0.0–1.2)
Total Protein: 9 g/dL — ABNORMAL HIGH (ref 6.5–8.1)

## 2024-01-05 LAB — LACTATE DEHYDROGENASE: LDH: 208 U/L (ref 105–235)

## 2024-01-05 LAB — URIC ACID: Uric Acid, Serum: 6.5 mg/dL (ref 3.7–8.6)

## 2024-01-05 MED ORDER — DIPHENHYDRAMINE HCL 50 MG/ML IJ SOLN
50.0000 mg | Freq: Once | INTRAMUSCULAR | Status: DC
  Filled 2024-01-05: qty 1

## 2024-01-05 MED ORDER — MORPHINE SULFATE (PF) 2 MG/ML IV SOLN
2.0000 mg | Freq: Once | INTRAVENOUS | Status: AC
  Administered 2024-01-05: 2 mg via INTRAVENOUS
  Filled 2024-01-05: qty 1

## 2024-01-05 MED ORDER — ACETAMINOPHEN 325 MG PO TABS
650.0000 mg | ORAL_TABLET | Freq: Once | ORAL | Status: AC
  Administered 2024-01-05: 650 mg via ORAL
  Filled 2024-01-05: qty 2

## 2024-01-05 MED ORDER — SODIUM CHLORIDE 0.9 % IV SOLN
375.0000 mg/m2 | Freq: Once | INTRAVENOUS | Status: AC
  Administered 2024-01-05: 900 mg via INTRAVENOUS
  Filled 2024-01-05: qty 50

## 2024-01-05 MED ORDER — FAMOTIDINE IN NACL 20-0.9 MG/50ML-% IV SOLN
20.0000 mg | Freq: Once | INTRAVENOUS | Status: AC
  Administered 2024-01-05: 20 mg via INTRAVENOUS
  Filled 2024-01-05: qty 50

## 2024-01-05 MED ORDER — METHYLPREDNISOLONE SODIUM SUCC 125 MG IJ SOLR
125.0000 mg | Freq: Once | INTRAMUSCULAR | Status: AC
  Administered 2024-01-05: 125 mg via INTRAVENOUS
  Filled 2024-01-05: qty 2

## 2024-01-05 MED ORDER — SODIUM CHLORIDE 0.9 % IV SOLN: INTRAVENOUS | Status: DC

## 2024-01-05 MED ORDER — DIPHENHYDRAMINE HCL 50 MG/ML IJ SOLN
50.0000 mg | Freq: Once | INTRAMUSCULAR | Status: AC
  Administered 2024-01-05: 50 mg via INTRAVENOUS
  Filled 2024-01-05: qty 1

## 2024-01-05 NOTE — Patient Instructions (Addendum)
 [facility] - A DEPT OF [facility]  Discharge Instructions: Thank you for choosing [facility] to provide your oncology and hematology care.   If you have a lab appointment with the Cancer Center, please go directly to the Cancer Center and check in at the registration area.   Wear comfortable clothing and clothing appropriate for easy access to any Portacath or PICC line.   We strive to give you quality time with your provider. You may need to reschedule your appointment if you arrive late (15 or more minutes).  Arriving late affects you and other patients whose appointments are after yours.  Also, if you miss three or more appointments without notifying the office, you may be dismissed from the clinic at the provider's discretion.      For prescription refill requests, have your pharmacy contact our office and allow 72 hours for refills to be completed.    Today you received the following chemotherapy and/or immunotherapy agent: Rituximab       To help prevent nausea and vomiting after your treatment, we encourage you to take your nausea medication as directed.  BELOW ARE SYMPTOMS THAT SHOULD BE REPORTED IMMEDIATELY: *FEVER GREATER THAN 100.4 F (38 C) OR HIGHER *CHILLS OR SWEATING *NAUSEA AND VOMITING THAT IS NOT CONTROLLED WITH YOUR NAUSEA MEDICATION *UNUSUAL SHORTNESS OF BREATH *UNUSUAL BRUISING OR BLEEDING *URINARY PROBLEMS (pain or burning when urinating, or frequent urination) *BOWEL PROBLEMS (unusual diarrhea, constipation, pain near the anus) TENDERNESS IN MOUTH AND THROAT WITH OR WITHOUT PRESENCE OF ULCERS (sore throat, sores in mouth, or a toothache) UNUSUAL RASH, SWELLING OR PAIN  UNUSUAL VAGINAL DISCHARGE OR ITCHING   Items with * indicate a potential emergency and should be followed up as soon as possible or go to the Emergency Department if any problems should occur.  Please show the CHEMOTHERAPY ALERT CARD or  IMMUNOTHERAPY ALERT CARD at check-in to the Emergency Department and triage nurse.  Should you have questions after your visit or need to cancel or reschedule your appointment, please contact [facility] - A DEPT OF [facility]  Dept: [phone]  and follow the prompts.  Office hours are 8:00 a.m. to 4:30 p.m. Monday - Friday. Please note that voicemails left after 4:00 p.m. may not be returned until the following business day.  We are closed weekends and major holidays. You have access to a nurse at all times for urgent questions. Please call the main number to the clinic Dept: [phone] and follow the prompts.   For any non-urgent questions, you may also contact your provider using MyChart. We now offer e-Visits for anyone 75 and older to request care online for non-urgent symptoms. For details visit [email or web address].   Also download the MyChart app! Go to the app store, search MyChart, open the app, select [place], and log in with your MyChart username and password.  Rituximab  Injection What is this medication? RITUXIMAB  (ri TUX i mab) treats leukemia and lymphoma. It works by blocking a protein that causes cancer cells to grow and multiply. This helps to slow or stop the spread of cancer cells. It may also be used to treat autoimmune conditions, such as arthritis. It works by slowing down an overactive immune system. It is a monoclonal antibody. This medicine may be used for other purposes; ask your health care provider or pharmacist if you have questions. COMMON BRAND NAME(S): RIABNI , Rituxan , RUXIENCE , truxima  What should  I tell my care team before I take this medication? They need to know if you have any of these conditions: Chest pain Heart disease Immune system problems Infection, such as chickenpox, cold sores, hepatitis B, herpes Irregular heartbeat or rhythm Kidney disease Low blood counts, such as low white cells, platelets, red  cells Lung disease Recent or upcoming vaccine An unusual or allergic reaction to rituximab , other medications, foods, dyes, or preservatives Pregnant or trying to get pregnant Breast-feeding How should I use this medication? This medication is injected into a vein. It is given by a care team in a hospital or clinic setting. A special MedGuide will be given to you before each treatment. Be sure to read this information carefully each time. Talk to your care team about the use of this medication in children. While this medication may be prescribed for children as young as 6 months for selected conditions, precautions do apply. Overdosage: If you think you have taken too much of this medicine contact a poison control center or emergency room at once. NOTE: This medicine is only for you. Do not share this medicine with others. What if I miss a dose? Keep appointments for follow-up doses. It is important not to miss your dose. Call your care team if you are unable to keep an appointment. What may interact with this medication? Do not take this medication with any of the following: Live vaccines This medication may also interact with the following: Cisplatin This list may not describe all possible interactions. Give your health care provider a list of all the medicines, herbs, non-prescription drugs, or dietary supplements you use. Also tell them if you smoke, drink alcohol, or use illegal drugs. Some items may interact with your medicine. What should I watch for while using this medication? Your condition will be monitored carefully while you are receiving this medication. You may need blood work while taking this medication. This medication can cause serious infusion reactions. To reduce the risk your care team may give you other medications to take before receiving this one. Be sure to follow the directions from your care team. This medication may increase your risk of getting an infection. Call  your care team for advice if you get a fever, chills, sore throat, or other symptoms of a cold or flu. Do not treat yourself. Try to avoid being around people who are sick. Call your care team if you are around anyone with measles, chickenpox, or if you develop sores or blisters that do not heal properly. Avoid taking medications that contain aspirin, acetaminophen , ibuprofen , naproxen, or ketoprofen unless instructed by your care team. These medications may hide a fever. This medication may cause serious skin reactions. They can happen weeks to months after starting the medication. Contact your care team right away if you notice fevers or flu-like symptoms with a rash. The rash may be red or purple and then turn into blisters or peeling of the skin. You may also notice a red rash with swelling of the face, lips, or lymph nodes in your neck or under your arms. In some patients, this medication may cause a serious brain infection that may cause death. If you have any problems seeing, thinking, speaking, walking, or standing, tell your care team right away. If you cannot reach your care team, urgently seek another source of medical care. Talk to your care team if you may be pregnant. Serious birth defects can occur if you take this medication during pregnancy and for  12 months after the last dose. You will need a negative pregnancy test before starting this medication. Contraception is recommended while taking this medication and for 12 months after the last dose. Your care team can help you find the option that works for you. Do not breastfeed while taking this medication and for at least 6 months after the last dose. What side effects may I notice from receiving this medication? Side effects that you should report to your care team as soon as possible: Allergic reactions or angioedema--skin rash, itching or hives, swelling of the face, eyes, lips, tongue, arms, or legs, trouble swallowing or  breathing Bowel blockage--stomach cramping, unable to have a bowel movement or pass gas, loss of appetite, vomiting Dizziness, loss of balance or coordination, confusion or trouble speaking Heart attack--pain or tightness in the chest, shoulders, arms, or jaw, nausea, shortness of breath, cold or clammy skin, feeling faint or lightheaded Heart rhythm changes--fast or irregular heartbeat, dizziness, feeling faint or lightheaded, chest pain, trouble breathing Infection--fever, chills, cough, sore throat, wounds that don't heal, pain or trouble when passing urine, general feeling of discomfort or being unwell Infusion reactions--chest pain, shortness of breath or trouble breathing, feeling faint or lightheaded Kidney injury--decrease in the amount of urine, swelling of the ankles, hands, or feet Liver injury--right upper belly pain, loss of appetite, nausea, light-colored stool, dark yellow or brown urine, yellowing skin or eyes, unusual weakness or fatigue Redness, blistering, peeling, or loosening of the skin, including inside the mouth Stomach pain that is severe, does not go away, or gets worse Tumor lysis syndrome (TLS)--nausea, vomiting, diarrhea, decrease in the amount of urine, dark urine, unusual weakness or fatigue, confusion, muscle pain or cramps, fast or irregular heartbeat, joint pain Side effects that usually do not require medical attention (report to your care team if they continue or are bothersome): Headache Joint pain Nausea Runny or stuffy nose Unusual weakness or fatigue This list may not describe all possible side effects. Call your doctor for medical advice about side effects. You may report side effects to FDA at 1-800-FDA-1088. Where should I keep my medication? This medication is given in a hospital or clinic. It will not be stored at home. NOTE: This sheet is a summary. It may not cover all possible information. If you have questions about this medicine, talk to your  doctor, pharmacist, or health care provider.  2024 Elsevier/Gold Standard (2021-06-14 00:00:00)Rituximab ; Hyaluronidase Injection

## 2024-01-05 NOTE — Progress Notes (Signed)
 [facility]  ONCOLOGY CLINIC PROGRESS NOTE   Patient Care Team: Beam, Lamar POUR, MD as PCP - General (Family Medicine) Sheldon Standing, MD as Consulting Physician (General Surgery) Alvaro Ricardo KATHEE Raddle., MD as Consulting Physician (Urology) Pyrtle, Gordy HERO, MD as Consulting Physician (Gastroenterology)  PATIENT NAME: Timothy Newton   MR#: [id number] DOB: [birth year]  Date of visit: 01/05/2024   ASSESSMENT & PLAN:   Jalal Rauch is a 63 y.o. gentleman with a past medical history of hypertension, PUD, MALT lymphoma originally diagnosed in 2006, previously untreated, was admitted to [facility] on 12/16/2023 after he presented with shortness of breath and was found to have a pleural effusion, presumed to be from MALT lymphoma progression.   MALT lymphoma (HCC) Please review oncology history for additional details and timeline of events.  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  He was lost to follow-up with oncology at outside facility.  Hospitalized on 12/16/2023 with progressive shortness of breath and was found to have significant right-sided pleural effusion that needed PleurX catheter placement with drainage.  Overall clinical picture was consistent with progression of MALT lymphoma driving the pleural effusions.  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 Night sweats are likely due to the lymphoma and may persist post-treatment. Hypercalcemia is likely a direct consequence of the lymphoma. Pleural effusion is attributed to the lymphoma, with small fluid still present in the right base. Abdominal tumor in the terminal ileum measures approximately 6 cm. Radiation therapy is avoided due to potential bowel complications. Rituximab  is expected to shrink the abdominal tumor.   He was started on rituximab  treatment on 12/24/2023 when he was  in the hospital.  He had infusion reaction with severe rigors, which necessitated additional allergy medicines and morphine .  Following this intervention, he was able to successfully complete rituximab  and did not experience any other issues.  Due for dose # 3 of rituximab  today.  Labs showed no dose-limiting toxicities.  Will proceed with rituximab  at a slow rate with extra allergy medicines before and also additional Benadryl  halfway through the infusion.  Plan to continue rituximab  weekly for up to 4 doses.  -He has a CT scan of the chest scheduled next Thursday to assess fluid status, followed by appointment with his pulmonologist on 01/12/2024.  He may undergo repeat thoracentesis depending on CT findings and symptoms.  - Continue to monitor calcium levels due to fluctuations.  RTC in 1 week for labs, office visit and continuation of rituximab  treatments.  Malignant pleural effusion (HCC) He has CT chest scheduled for reevaluation on 01/08/2024.  He has an appointment with his pulmonologist on 01/12/2024 for follow-up.    I reviewed lab results and outside records for this visit and discussed relevant results with the patient. Diagnosis, plan of care and treatment options were also discussed in detail with the patient. Opportunity provided to ask questions and answers provided to his apparent satisfaction. Provided instructions to call our clinic with any problems, questions or concerns prior to return visit. I recommended to continue follow-up with PCP and sub-specialists. He verbalized understanding and agreed with the plan.   NCCN guidelines have been consulted in the planning of this patient's care.  I spent a total of 42 minutes during this encounter with the patient including review of chart and various tests results, discussions about plan of care and coordination of care plan.   Jasline Buskirk  Johanna Matto, MD  01/05/2024 2:04 PM  [facility] - A DEPT OF  [facility] [address] [place] [address] Dept: [phone] Dept Fax: [phone]    CHIEF COMPLAINT/ REASON FOR VISIT:   MALT lymphoma originally diagnosed in 2006, now with progression with pleural effusion  Current Treatment: Rituximab  weekly, started from 12/24/2023.  INTERVAL HISTORY:    Discussed the use of AI scribe software for clinical note transcription with the patient, who gave verbal consent to proceed.  History of Present Illness Timothy Newton is a 63 year old male with lymphoma who presents for follow-up regarding his treatment and upcoming CT scan.  He is currently undergoing treatment for lymphoma and has tolerated his recent infusion well without any adverse reactions. No breathing difficulties have been noted, and he has not experienced any issues since returning home after the infusion.  A CT scan of the chest is scheduled for Thursday, January 08, 2024. He is also scheduled to see a lung specialist on Monday, January 12, 2024.  He has no nausea, vomiting, or other gastrointestinal symptoms. His appetite remains good, and he has been eating well. No breathing difficulties have been noted.     I have reviewed the past medical history, past surgical history, social history and family history with the patient and they are unchanged from previous note.  HISTORY OF PRESENT ILLNESS:   ONCOLOGY HISTORY:   He has been following up with his PCP at [facility] health.  He presented to an urgent care facility in September 2025 with exertional shortness of breath/bronchospasm.  Chest x-ray at that time showed right upper lobe lung mass.   He has remote history of MALT lymphoma in 2006, for which he was on surveillance only.  Given chest x-ray findings of right upper lobe lung mass, CT chest with contrast was obtained by his PCP and on 10/28/2023 showed large right-sided pleural effusion, multifocal areas of consolidation in the  right lung including a masslike area in the right apex concerning for malignancy.  Other areas were more suggestive of consolidation and/or atelectasis.  Multifocal smaller opacities in the left lung.  Findings concerning for neoplasm or infection.  Small mediastinal lymph nodes.  Prominent right diaphragmatic lymph node.  With these findings, he was referred to pulmonology.  On review of records, he has had right upper lobe lung nodule since 2013 and was lost to follow-up.  He was seen by Dr. Javaid on 11/03/2023 and plan made for thoracentesis and PET scan.   On 11/09/2023, he presented to [facility] health ED and Dr. Toribio Sharps with pulmonology performed thoracentesis.  Pleural fluid cytology showed no malignant cells.  Showed findings consistent with chronic inflammation.   On 12/01/2023, staging PET scan at [facility] showed hypermetabolic bilateral consolidation or neoplasm. The changes are increased compared to 10/28/2023. Consider bronchoscopy for further evaluation. Large right effusion with one area of hypermetabolic activity posteriorly. Hypermetabolic distal ileal mass. Possibly lymphoma Hypermetabolic mesenteric nodes.    Since clinical picture remained concerning for malignancy, additional workup was planned in the outpatient setting.  Patient was seen in pulmonology clinic by Dr. Tamala Patch on 12/15/2023.  Since he did not have significant improvement after previous thoracentesis, recommended ER evaluation for admission/further management including chest tube placement.    On 12/16/2023, he underwent pleural catheter insertion.  On 12/18/2023, Dr. Toribio Sharps performed flexible bronchoscopy, EBUS and biopsy.  He had unique anatomy and had to  undergo transbronchial biopsy by EBUS along apical wall of right upper lobe.  Pathology from [facility] from right upper lobe lung nodule/lymph node came back positive for malignant cells.  Immunostains showed CD5 negative, CD10 negative, CD20 positive mature  B-cell lymphoma, low-grade.  This is consistent with patient's history of MALT lymphoma.  Hence we were consulted for additional recommendations.   Reviewed CT chest abdomen pelvis from 12/17/2023.  It showed diminished volume of a loculated right pleural effusion containing numerous small air loculations, pigtail catheter with formed pigtail in the dependent right pleural space. Very dense consolidation of the right upper lobe, not significantly changed, with additional very dense consolidation of the right middle lobe also not significantly changed. There is however somewhat improved aeration of the right lower lobe secondary to diminished effusion volume. Additional dense consolidation of the posterior suprahilar left upper lobe is not significantly changed, nor are numerous scattered irregular opacities throughout the remainder of the aerated portions of the lungs. Constellation of pulmonary findings is suggestive of pulmonary lymphomatous involvement especially inasmuch as right middle lobe consolidation was present on a relatively remote CT examination of the abdomen pelvis dated 2024. Unchanged enlarged mediastinal lymph nodes. 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 Pancolonic diverticulosis without evidence of acute diverticulitis.   Clinical picture was consistent with  MALT lymphoma, which was previously untreated but now is causing complications including driving pleural effusions.  This warranted treatment initiation.     Since patient remained symptomatic from pleural effusion and it seemed to arise from MALT lymphoma, inpatient chemotherapy was initiated with rituximab , starting from 12/24/2023.  He had infusion reaction with severe rigors, necessitating additional allergy medications and morphine .  He was able to finish rituximab  infusion with this intervention and remained asymptomatic afterwards.   Plan to continue  rituximab  weekly for 4 cycles.  Oncology History  MALT lymphoma (HCC)  05/23/2011 Initial Diagnosis   MALT lymphoma (HCC)   12/24/2023 -  Chemotherapy   Patient is on Treatment Plan : NON-HODGKINS LYMPHOMA Rituximab  q7d     12/30/2023 Cancer Staging   Staging form: Hodgkin and Non-Hodgkin Lymphoma, AJCC 8th Edition - Clinical: Stage III (Marginal zone lymphoma) - Signed by Autumn Millman, MD on 12/30/2023       REVIEW OF SYSTEMS:   Review of Systems - Oncology  All other pertinent systems were reviewed with the patient and are negative.  ALLERGIES: He is allergic to rituximab .  MEDICATIONS:  Current Outpatient Medications  Medication Sig Dispense Refill   acetaminophen  (TYLENOL ) 325 MG tablet Take 2 tablets (650 mg total) by mouth every 6 (six) hours as needed for mild pain (pain score 1-3) or fever (or Fever >/= 101).     albuterol  (VENTOLIN  HFA) 108 (90 Base) MCG/ACT inhaler Inhale 2 puffs into the lungs.     lisinopril  (ZESTRIL ) 20 MG tablet Take 30 mg by mouth daily.     oxyCODONE -acetaminophen  (PERCOCET) 5-325 MG tablet Take 1 tablet by mouth every 6 (six) hours as needed. 12 tablet 0   No current facility-administered medications for this visit.   Facility-Administered Medications Ordered in Other Visits  Medication Dose Route Frequency Provider Last Rate Last Admin   0.9 %  sodium chloride  infusion   Intravenous Continuous Elesia Pemberton, MD 10 mL/hr at 01/05/24 1055 New Bag at 01/05/24 1055   diphenhydrAMINE  (BENADRYL ) injection 50 mg  50 mg Intravenous Once Twana Wileman, Millman, MD  VITALS:   Blood pressure (!) 127/90, pulse 88, temperature 98.9 F (37.2 C), temperature source Temporal, resp. rate 16, weight 234 lb 1.6 oz (106.2 kg), SpO2 97%.  Wt Readings from Last 3 Encounters:  01/05/24 234 lb 1.6 oz (106.2 kg)  12/30/23 233 lb 9.6 oz (106 kg)  12/18/23 245 lb (111.1 kg)    Body mass index is 30.06 kg/m.    Onc Performance Status - 01/05/24 0902        KPS SCALE   KPS % SCORE Normal, no compliants, no evidence of disease          PHYSICAL EXAM:   Physical Exam Constitutional:      General: He is not in acute distress.    Appearance: Normal appearance.  HENT:     Head: Normocephalic and atraumatic.  Eyes:     Conjunctiva/sclera: Conjunctivae normal.  Cardiovascular:     Rate and Rhythm: Normal rate and regular rhythm.  Pulmonary:     Effort: Pulmonary effort is normal. No respiratory distress.     Comments: Decreased breath sounds in right base Abdominal:     General: There is no distension.  Neurological:     General: No focal deficit present.     Mental Status: He is alert and oriented to person, place, and time.  Psychiatric:        Mood and Affect: Mood normal.        Behavior: Behavior normal.       LABORATORY DATA:   I have reviewed the data as listed.  Results for orders placed or performed in visit on 01/05/24  Uric acid  Result Value Ref Range   Uric Acid, Serum 6.5 3.7 - 8.6 mg/dL  Lactate dehydrogenase  Result Value Ref Range   LDH 208 105 - 235 U/L  CMP (Cancer Center only)  Result Value Ref Range   Sodium 139 135 - 145 mmol/L   Potassium 4.0 3.5 - 5.1 mmol/L   Chloride 102 98 - 111 mmol/L   CO2 28 22 - 32 mmol/L   Glucose, Bld 100 (H) 70 - 99 mg/dL   BUN 12 8 - 23 mg/dL   Creatinine 9.03 9.38 - 1.24 mg/dL   Calcium 89.1 (H) 8.9 - 10.3 mg/dL   Total Protein 9.0 (H) 6.5 - 8.1 g/dL   Albumin 3.6 3.5 - 5.0 g/dL   AST 17 15 - 41 U/L   ALT 24 0 - 44 U/L   Alkaline Phosphatase 69 38 - 126 U/L   Total Bilirubin 0.8 0.0 - 1.2 mg/dL   GFR, Estimated >39 >39 mL/min   Anion gap 9 5 - 15  CBC with Differential (Cancer Center Only)  Result Value Ref Range   WBC Count 6.0 4.0 - 10.5 K/uL   RBC 4.32 4.22 - 5.81 MIL/uL   Hemoglobin 12.6 (L) 13.0 - 17.0 g/dL   HCT 59.8 60.9 - 47.9 %   MCV 92.8 80.0 - 100.0 fL   MCH 29.2 26.0 - 34.0 pg   MCHC 31.4 30.0 - 36.0 g/dL   RDW 86.6 88.4 - 84.4 %    Platelet Count 308 150 - 400 K/uL   nRBC 0.0 0.0 - 0.2 %   Neutrophils Relative % 60 %   Neutro Abs 3.7 1.7 - 7.7 K/uL   Lymphocytes Relative 26 %   Lymphs Abs 1.6 0.7 - 4.0 K/uL   Monocytes Relative 10 %   Monocytes Absolute 0.6 0.1 - 1.0 K/uL  Eosinophils Relative 2 %   Eosinophils Absolute 0.1 0.0 - 0.5 K/uL   Basophils Relative 2 %   Basophils Absolute 0.1 0.0 - 0.1 K/uL   Immature Granulocytes 0 %   Abs Immature Granulocytes 0.02 0.00 - 0.07 K/uL      RADIOGRAPHIC STUDIES:  I have personally reviewed the radiological images as listed and agree with the findings in the report.  DG CHEST PORT 1 VIEW EXAM: 1 VIEW(S) XRAY OF THE CHEST _study_datetime_  COMPARISON: 12/21/2023  CLINICAL HISTORY: Chest tube in place. FINDINGS:  LINES, TUBES AND DEVICES: Right sided pigtail catheter is unchanged in position.  LUNGS AND PLEURA: Increased pleural parenchymal opacification throughout the majority of the right hemithorax. Pleural air-fluid levels are less distinct today. Chronic interstitial thickening without left sided pulmonary opacity.  HEART AND MEDIASTINUM: No acute abnormality of the cardiac and mediastinal silhouettes.  BONES AND SOFT TISSUES: No acute osseous abnormality.  IMPRESSION: 1. Worsened right sided aeration, likely due to a combination of pleural fluid, possible pleural air, and airspace disease. 2. Right sided pleural pigtail catheter remains in place.  Electronically signed by: Rockey Kilts MD 12/23/2023 12:05 PM EST RP Workstation: [id number]    CODE STATUS:  Code Status History     Date Active Date Inactive Code Status Order ID Comments User Context   12/16/2023 1305 12/25/2023 2101 Full Code [id number]  Seena Marsa NOVAK, MD ED    Questions for Most Recent Historical Code Status (Order [id number])     Question Answer   By: Consent: discussion documented in EHR            No orders of the defined types were placed in this  encounter.    Future Appointments  Date Time Provider Department Center  01/08/2024  8:30 AM [facility] 2 [facility] [place]  01/12/2024 11:00 AM Adrien Winfred Berke, MD [facility] [address]  01/13/2024  8:15 AM [facility] PHLEBOTOMIST [facility] None  01/13/2024  8:30 AM Kadeem Hyle, Chinita, MD [facility] None  01/13/2024  9:00 AM [facility] INFUSION [facility] None  01/20/2024  8:30 AM [facility] PHLEBOTOMIST [facility] None  01/20/2024  9:00 AM Esai Stecklein, Chinita, MD [facility] None  01/20/2024  9:30 AM [facility] INFUSION [facility] None      This document was completed utilizing speech recognition software. Grammatical errors, random word insertions, pronoun errors, and incomplete sentences are an occasional consequence of this system due to software limitations, ambient noise, and hardware issues. Any formal questions or concerns about the content, text or information contained within the body of this dictation should be directly addressed to the provider for clarification.

## 2024-01-05 NOTE — Progress Notes (Signed)
 Patient seen by Dr. Archie Patten Pasam today  Vitals are within treatment parameters:Yes   Labs are within treatment parameters: Yes   Treatment plan has been signed: Yes   Per physician team, Patient is ready for treatment and there are NO modifications to the treatment plan.

## 2024-01-05 NOTE — Assessment & Plan Note (Addendum)
 Please review oncology history for additional details and timeline of events.  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  He was lost to follow-up with oncology at outside facility.  Hospitalized on 12/16/2023 with progressive shortness of breath and was found to have significant right-sided pleural effusion that needed PleurX catheter placement with drainage.  Overall clinical picture was consistent with progression of MALT lymphoma driving the pleural effusions.  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 Night sweats are likely due to the lymphoma and may persist post-treatment. Hypercalcemia is likely a direct consequence of the lymphoma. Pleural effusion is attributed to the lymphoma, with small fluid still present in the right base. Abdominal tumor in the terminal ileum measures approximately 6 cm. Radiation therapy is avoided due to potential bowel complications. Rituximab  is expected to shrink the abdominal tumor.   He was started on rituximab  treatment on 12/24/2023 when he was in the hospital.  He had infusion reaction with severe rigors, which necessitated additional allergy medicines and morphine .  Following this intervention, he was able to successfully complete rituximab  and did not experience any other issues.  Due for dose # 3 of rituximab  today.  Labs showed no dose-limiting toxicities.  Will proceed with rituximab  at a slow rate with extra allergy medicines before and also additional Benadryl  halfway through the infusion.  Plan to continue rituximab  weekly for up to 4 doses.  -He has a CT scan of the chest scheduled next Thursday to assess fluid status, followed by appointment with his pulmonologist on 01/12/2024.  He may undergo repeat thoracentesis depending on CT findings and symptoms.  - Continue to monitor calcium levels due to fluctuations.  RTC in 1 week  for labs, office visit and continuation of rituximab  treatments.

## 2024-01-05 NOTE — Assessment & Plan Note (Signed)
 He has CT chest scheduled for reevaluation on 01/08/2024.  He has an appointment with his pulmonologist on 01/12/2024 for follow-up.

## 2024-01-06 ENCOUNTER — Inpatient Hospital Stay: Admitting: Oncology

## 2024-01-06 ENCOUNTER — Inpatient Hospital Stay

## 2024-01-08 ENCOUNTER — Ambulatory Visit (HOSPITAL_COMMUNITY): Admission: RE | Admit: 2024-01-08 | Discharge: 2024-01-08 | Disposition: A | Source: Ambulatory Visit

## 2024-01-08 ENCOUNTER — Other Ambulatory Visit: Payer: Self-pay | Admitting: Oncology

## 2024-01-08 ENCOUNTER — Encounter (HOSPITAL_COMMUNITY): Payer: Self-pay

## 2024-01-08 DIAGNOSIS — C884 Extranodal marginal zone b-cell lymphoma of mucosa-associated lymphoid tissue (malt-lymphoma) not having achieved remission: Secondary | ICD-10-CM

## 2024-01-08 DIAGNOSIS — J90 Pleural effusion, not elsewhere classified: Secondary | ICD-10-CM | POA: Diagnosis present

## 2024-01-08 DIAGNOSIS — C3491 Malignant neoplasm of unspecified part of right bronchus or lung: Secondary | ICD-10-CM | POA: Diagnosis present

## 2024-01-08 DIAGNOSIS — J9811 Atelectasis: Secondary | ICD-10-CM | POA: Diagnosis present

## 2024-01-09 ENCOUNTER — Telehealth: Payer: Self-pay

## 2024-01-09 NOTE — Telephone Encounter (Signed)
 Called and spoke to pt - advised of recommendations per Dr. Adrien. Pt verbalized understanding, NFN.

## 2024-01-09 NOTE — Telephone Encounter (Signed)
 Copied from CRM #[id number]. Topic: Clinical - Medical Advice >> Jan 09, 2024  8:22 AM [name] wrote: Reason for CRM: Patient would like to know if there are any limitations for him to return to work.   Callback number: [phone]    Called and spoke to pt - advised that I was sending a message to Dr. Adrien. Pt verbalized understanding.  Dr. Adrien, does this pt have any limitations regarding returning to work? Please advise, thank you!

## 2024-01-11 NOTE — Progress Notes (Unsigned)
 New Patient Pulmonology Office Visit   Subjective:  Patient ID: Timothy Newton, male    DOB: 1960/05/03  MRN: [id number]  Referred by: Dorcus Lamar POUR, MD  CC:  No chief complaint on file.  Discussed the use of AI scribe software for clinical note transcription with the patient, who gave verbal consent to proceed.  History of Present Illness Timothy Newton is a 63 year old male with history of pulmonary MALT lymphoma. He was diagnosed MALT lymphoma in 2006 and he was on observation only, however he lost follow up with oncology. On October 28, 2023, he noticed dyspnea on exertion, during light jogging and is more severe than typical exertion-related breathlessness. A chest x-ray in the ER revealed right pleural effusion. Thoracentesis provided minimal relief, with fluid analysis showing chronic inflammation lymphocytic exudative and pathology negative for malignancy. A PET scan two weeks ago indicated a large pleural effusion and hypermetabolic bilateral consolidation, changes increased compared to 10/28/2023. Hypermetabolic distal ileal mass, and mesenteric nodes, possible lymphoma. Significant dyspnea persists, especially with minimal exertion. I referred to go to the ER due to large pleural effusion, and he was admitted on 12/16/23, where chest tube was placed, and EBUS, TBBX were performed, showing MALT lymphoma. He started therapy with Rituximab .  He denies prior pulmonary conditions, smoking, or vaping. No history of heart problems, sarcoidosis, or autoimmune diseases.  There is no family history of lung cancer or lymphoma. He owns a trucking company.   ROS as above.  Allergies: Rituximab   Current Outpatient Medications:    acetaminophen  (TYLENOL ) 325 MG tablet, Take 2 tablets (650 mg total) by mouth every 6 (six) hours as needed for mild pain (pain score 1-3) or fever (or Fever >/= 101)., Disp: , Rfl:    albuterol  (VENTOLIN  HFA) 108 (90 Base) MCG/ACT inhaler, Inhale 2 puffs  into the lungs., Disp: , Rfl:    lisinopril  (ZESTRIL ) 20 MG tablet, Take 30 mg by mouth daily., Disp: , Rfl:    oxyCODONE -acetaminophen  (PERCOCET) 5-325 MG tablet, Take 1 tablet by mouth every 6 (six) hours as needed., Disp: 12 tablet, Rfl: 0 Past Medical History:  Diagnosis Date   Anemia    Bleeding gastric ulcer    Blood transfusion    H. 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   s/p 14 days Nexium, clarithromycin and amoxicillin    Past Surgical History:  Procedure Laterality Date   BRONCHIAL NEEDLE ASPIRATION BIOPSY  12/18/2023   Procedure: BRONCHOSCOPY, WITH NEEDLE ASPIRATION BIOPSY;  Surgeon: Claudene Toribio BROCKS, MD;  Location: [facility] ENDOSCOPY;  Service: Pulmonary;;   HYDROCELE EXCISION Bilateral 12/27/2022   Procedure: BILATERAL HYDROCELECTOMY ADULT;  Surgeon: Alvaro Ricardo KATHEE Mickey., MD;  Location: [facility] ORS;  Service: Urology;  Laterality: Bilateral;   ORCHIOPEXY Bilateral 12/27/2022   Procedure: BILATERAL ORCHIOPEXY ADULT;  Surgeon: Alvaro Ricardo KATHEE Mickey., MD;  Location: [facility] ORS;  Service: Urology;  Laterality: Bilateral;  120 MINS FOR CASE   UPPER GASTROINTESTINAL ENDOSCOPY  2006, 2013   VIDEO BRONCHOSCOPY WITH ENDOBRONCHIAL ULTRASOUND Right 12/18/2023   Procedure: BRONCHOSCOPY, WITH EBUS;  Surgeon: Claudene Toribio BROCKS, MD;  Location: [facility] ENDOSCOPY;  Service: Pulmonary;  Laterality: Right;   Family History  Problem Relation Age of Onset   Colon cancer Neg Hx    Diabetes Neg Hx    Heart disease Neg Hx    Social History   Socioeconomic History   Marital status: Married  Spouse name: Not on file   Number of children: 2   Years of education: Not on file   Highest education level: Not on file  Occupational History    Employer: [id number]  Tobacco Use   Smoking status: Former    Current packs/day: 0.00    Average packs/day: 1 pack/day for 5.0 years (5.0 ttl pk-yrs)    Types: Cigarettes    Start date:  03/18/1996    Quit date: 03/18/2001    Years since quitting: 22.8   Smokeless tobacco: Former  Building Services Engineer status: Never Used  Substance and Sexual Activity   Alcohol use: No   Drug use: No   Sexual activity: Yes  Other Topics Concern   Not on file  Social History Narrative   Not on file   Social Drivers of Health   Financial Resource Strain: Low Risk (04/03/2023)   Received from [facility]   Overall Financial Resource Strain (CARDIA)    Difficulty of Paying Living Expenses: Not hard at all  Food Insecurity: No Food Insecurity (12/30/2023)   Hunger Vital Sign    Worried About Running Out of Food in the Last Year: Never true    Ran Out of Food in the Last Year: Never true  Transportation Needs: No Transportation Needs (12/30/2023)   PRAPARE - Administrator, Civil Service (Medical): No    Lack of Transportation (Non-Medical): No  Physical Activity: Sufficiently Active (01/24/2021)   Received from [facility]   Exercise Vital Sign    On average, how many days per week do you engage in moderate to strenuous exercise (like a brisk walk)?: 5 days    On average, how many minutes do you engage in exercise at this level?: 60 min  Stress: No Stress Concern Present (01/24/2021)   Received from [facility] of Occupational Health - Occupational Stress Questionnaire    Feeling of Stress : Not at all  Social Connections: Moderately Isolated (01/24/2021)   Received from [facility]   Social Connection and Isolation Panel    In a typical week, how many times do you talk on the phone with family, friends, or neighbors?: More than three times a week    How often do you get together with friends or relatives?: Never    How often do you attend church or religious services?: Never    Do you belong to any clubs or organizations such as church groups, unions, fraternal or athletic groups, or school groups?: Yes    How often do you attend meetings of  the clubs or organizations you belong to?: Never    Are you married, widowed, divorced, separated, never married, or living with a partner?: Never married  Intimate Partner Violence: Not At Risk (12/30/2023)   Humiliation, Afraid, Rape, and Kick questionnaire    Fear of Current or Ex-Partner: No    Emotionally Abused: No    Physically Abused: No    Sexually Abused: No    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   Objective:  There were no vitals taken for this visit. Wt Readings from Last 3 Encounters:  01/05/24 234 lb 1.6 oz (106.2 kg)  12/30/23 233 lb 9.6 oz (106 kg)  12/18/23 245 lb (111.1 kg)   BMI Readings from Last 3 Encounters:  01/05/24 30.06 kg/m  12/30/23 29.99 kg/m  12/18/23 31.46 kg/m   SpO2 Readings from Last 3 Encounters:  01/05/24 98%  01/05/24 97%  12/30/23 99%    Physical Exam Constitutional:      Appearance: Normal appearance.  HENT:     Head: Normocephalic.     Mouth/Throat:     Mouth: Mucous membranes are moist.  Eyes:     Extraocular Movements: Extraocular movements intact.     Pupils: Pupils are equal, round, and reactive to light.  Cardiovascular:     Rate and Rhythm: Normal rate and regular rhythm.  Pulmonary:     Comments: Mild tachypnea. Decreased lung sounds on the 2/3 of the R lung. Normal Lung sounds on the left. Musculoskeletal:        General: Normal range of motion.     Comments: No LE edema.  Skin:    General: Skin is warm.  Neurological:     General: No focal deficit present.     Mental Status: He is alert and oriented to person, place, and time.    11/09/23 Thoracentesis: 1L of bloody fluid Glucose 71 LDH 460 Protein 8.4 Total nucleated cell count 17 963, lymphopcytic predominance Culture negative Pathology: no malignant cells, chronic inflammation   12/18/23 EBUS, TBBX - MALT lymphoma  PET SCAN on 12/01/2023 only report available, no images Severe consolidation or mass at the right apex measuring 83 mm with  an SUV Max of 8.9. Complete consolidation of the right middle lobe with SUV Max of 6.0. Complete consolidation of the right lower lobe with SUV Max of 4.9. Consolidative change in the left upper lobe with SUV Max of 5.9 Large right pleural effusion. There is some hypermetabolic soft tissue in the posterior right pleural effusion on image 236 with SUV Max of 4.0.. Hypermetabolic lymph nodes in the ileocolic ligament. Lymph node on image 324 measures 11 mm with an SUV Max of 4.5. Mass in the distal ileum on image 357 measuring 54 x 38 mm with an SUV Max of 13.4. This is not causing obstruction. Spleen is normal in size and not hypermetabolic. No renal stone or hydronephrosis. Gallbladder present. No biliary ductal dilatation. Enlarged prostate. No concerning bone lesions.     IMPRESSION  1. Hypermetabolic bilateral consolidation or neoplasm. The changes are increased compared to 10/28/2023. Consider bronchoscopy for further evaluation. 2. Large right effusion with one area of hypermetabolic activity posteriorly. 3. Hypermetabolic distal ileal mass. Possibly lymphoma 4. Hypermetabolic mesenteric nodes   CT chest 11/09/23 1. No CT evidence for acute pulmonary embolus. 2. Dense consolidative airspace opacity in the right upper lobe with air bronchograms and potential areas of cystic bronchiectasis or peripheral cavitation anteriorly. Patient had a 2.7 x 2.6 cm nodular consolidative opacity in the right apex on the remote study from 2013. Consolidative airspace opacity in the right middle lobe with collapse/consolidation in the right lower lobe. Central consolidative opacity in the parahilar posterior left upper lobe. Imaging features are compatible with multifocal pneumonia. Given findings on the remote study, close follow-up warranted to exclude underlying neoplasm. 3. 12 mm anterior left upper lobe nodule with 9 mm lingular nodule. These may be infectious/inflammatory, but metastatic disease is  not excluded. 4. Mild mediastinal lymphadenopathy, potentially reactive. 5.  Aortic Atherosclerosis (ICD10-I70.0).  CT chest 2013:  Hypermetabolic right apical mass/infiltrate has slightly  enlarged from 06/04/2011. The persistence of this finding makes  infection slightly less plausible.  Lymphoma and primary  bronchogenic carcinoma cannot be excluded. Tissue sampling should  be considered.  2.  Additional pulmonary nodules and ground-glass opacities are  stable.  3. No lymphadenopathy.  PET 2013 1.  No abnormal hypermetabolic activity in the stomach to suggest  lymphoma recurrence.  2.  Extensive gastric mucosal thickening within the stomach which  appears slightly increased compared to prior.  3.  Hypermetabolic nodule in the right upper lobe.  [place] this to  represent a focus of infection or inflammation rather than  lymphoma metastasis.  Similar smaller nodules in the left upper  lobe and right middle lobe      Assessment & Plan:   Assessment & Plan MALT Lymphoma with lung involvement   Patient was recently admitted on 12/16/23 due to large pleural effusion, s/p chest tube and white out R lung. He underwent to EBUS, TBBX showing MALT lymphoma. Ritux was started. H  CT scan is better  Repeat in 3 months    No follow-ups on file.   Marny Patch, MD Pulmonary and Critical Care Medicine [facility]

## 2024-01-12 ENCOUNTER — Ambulatory Visit

## 2024-01-12 VITALS — BP 150/85 | HR 74 | Temp 98.2°F | Ht 74.0 in | Wt 239.0 lb

## 2024-01-12 DIAGNOSIS — C884 Extranodal marginal zone b-cell lymphoma of mucosa-associated lymphoid tissue (malt-lymphoma) not having achieved remission: Secondary | ICD-10-CM

## 2024-01-12 DIAGNOSIS — Z87891 Personal history of nicotine dependence: Secondary | ICD-10-CM

## 2024-01-12 NOTE — Patient Instructions (Addendum)
 Dear Timothy Newton  I will recommend to repeat CT chest in 3 month. Your last CT scan did not show significant fluid, but continues to show opacities with improvement.   Continue to follow up with oncologist.  Continue to exercise as you tolerate.   I will see you in 3 months.

## 2024-01-13 ENCOUNTER — Other Ambulatory Visit: Payer: Self-pay

## 2024-01-13 ENCOUNTER — Encounter: Payer: Self-pay | Admitting: Oncology

## 2024-01-13 ENCOUNTER — Inpatient Hospital Stay

## 2024-01-13 ENCOUNTER — Inpatient Hospital Stay: Admitting: Oncology

## 2024-01-13 VITALS — BP 136/82 | HR 93 | Temp 98.0°F | Resp 16 | Wt 236.7 lb

## 2024-01-13 VITALS — BP 133/89 | HR 87 | Temp 98.6°F | Resp 18

## 2024-01-13 DIAGNOSIS — C884 Extranodal marginal zone b-cell lymphoma of mucosa-associated lymphoid tissue (malt-lymphoma) not having achieved remission: Secondary | ICD-10-CM | POA: Diagnosis not present

## 2024-01-13 DIAGNOSIS — J910 Malignant pleural effusion: Secondary | ICD-10-CM

## 2024-01-13 DIAGNOSIS — Z5112 Encounter for antineoplastic immunotherapy: Secondary | ICD-10-CM | POA: Diagnosis not present

## 2024-01-13 LAB — CBC WITH DIFFERENTIAL (CANCER CENTER ONLY)
Abs Immature Granulocytes: 0.03 K/uL (ref 0.00–0.07)
Basophils Absolute: 0.1 K/uL (ref 0.0–0.1)
Basophils Relative: 1 %
Eosinophils Absolute: 0.2 K/uL (ref 0.0–0.5)
Eosinophils Relative: 3 %
HCT: 40.5 % (ref 39.0–52.0)
Hemoglobin: 12.8 g/dL — ABNORMAL LOW (ref 13.0–17.0)
Immature Granulocytes: 0 %
Lymphocytes Relative: 21 %
Lymphs Abs: 1.5 K/uL (ref 0.7–4.0)
MCH: 28.6 pg (ref 26.0–34.0)
MCHC: 31.6 g/dL (ref 30.0–36.0)
MCV: 90.4 fL (ref 80.0–100.0)
Monocytes Absolute: 0.7 K/uL (ref 0.1–1.0)
Monocytes Relative: 9 %
Neutro Abs: 5 K/uL (ref 1.7–7.7)
Neutrophils Relative %: 66 %
Platelet Count: 212 K/uL (ref 150–400)
RBC: 4.48 MIL/uL (ref 4.22–5.81)
RDW: 13.8 % (ref 11.5–15.5)
WBC Count: 7.5 K/uL (ref 4.0–10.5)
nRBC: 0 % (ref 0.0–0.2)

## 2024-01-13 LAB — CMP (CANCER CENTER ONLY)
ALT: 15 U/L (ref 0–44)
AST: 17 U/L (ref 15–41)
Albumin: 3.9 g/dL (ref 3.5–5.0)
Alkaline Phosphatase: 76 U/L (ref 38–126)
Anion gap: 11 (ref 5–15)
BUN: 12 mg/dL (ref 8–23)
CO2: 26 mmol/L (ref 22–32)
Calcium: 11 mg/dL — ABNORMAL HIGH (ref 8.9–10.3)
Chloride: 101 mmol/L (ref 98–111)
Creatinine: 1.13 mg/dL (ref 0.61–1.24)
GFR, Estimated: >60 mL/min (ref >60)
Glucose, Bld: 105 mg/dL — ABNORMAL HIGH (ref 70–99)
Potassium: 4 mmol/L (ref 3.5–5.1)
Sodium: 138 mmol/L (ref 135–145)
Total Bilirubin: 1.3 mg/dL — ABNORMAL HIGH (ref 0.0–1.2)
Total Protein: 9 g/dL — ABNORMAL HIGH (ref 6.5–8.1)

## 2024-01-13 LAB — LACTATE DEHYDROGENASE: LDH: 182 U/L (ref 105–235)

## 2024-01-13 LAB — URIC ACID: Uric Acid, Serum: 7.6 mg/dL (ref 3.7–8.6)

## 2024-01-13 MED ORDER — ACETAMINOPHEN 325 MG PO TABS
650.0000 mg | ORAL_TABLET | Freq: Once | ORAL | Status: AC
  Administered 2024-01-13: 650 mg via ORAL
  Filled 2024-01-13: qty 2

## 2024-01-13 MED ORDER — FAMOTIDINE IN NACL 20-0.9 MG/50ML-% IV SOLN
20.0000 mg | Freq: Once | INTRAVENOUS | Status: AC
  Administered 2024-01-13: 20 mg via INTRAVENOUS
  Filled 2024-01-13: qty 50

## 2024-01-13 MED ORDER — MORPHINE SULFATE (PF) 2 MG/ML IV SOLN
2.0000 mg | Freq: Once | INTRAVENOUS | Status: AC
  Administered 2024-01-13: 2 mg via INTRAVENOUS
  Filled 2024-01-13: qty 1

## 2024-01-13 MED ORDER — DIPHENHYDRAMINE HCL 50 MG/ML IJ SOLN
50.0000 mg | Freq: Once | INTRAMUSCULAR | Status: AC
  Administered 2024-01-13: 50 mg via INTRAVENOUS

## 2024-01-13 MED ORDER — METHYLPREDNISOLONE SODIUM SUCC 125 MG IJ SOLR
125.0000 mg | Freq: Once | INTRAMUSCULAR | Status: AC
  Administered 2024-01-13: 125 mg via INTRAVENOUS
  Filled 2024-01-13: qty 2

## 2024-01-13 MED ORDER — DIPHENHYDRAMINE HCL 50 MG/ML IJ SOLN
50.0000 mg | Freq: Once | INTRAMUSCULAR | Status: DC
  Filled 2024-01-13: qty 1

## 2024-01-13 MED ORDER — SODIUM CHLORIDE 0.9 % IV SOLN: INTRAVENOUS | Status: DC

## 2024-01-13 MED ORDER — SODIUM CHLORIDE 0.9 % IV SOLN
375.0000 mg/m2 | Freq: Once | INTRAVENOUS | Status: AC
  Administered 2024-01-13: 900 mg via INTRAVENOUS
  Filled 2024-01-13: qty 50

## 2024-01-13 NOTE — Patient Instructions (Signed)
 [facility] - A DEPT OF [facility]  Discharge Instructions: Thank you for choosing [facility] to provide your oncology and hematology care.   If you have a lab appointment with the Cancer Center, please go directly to the Cancer Center and check in at the registration area.   Wear comfortable clothing and clothing appropriate for easy access to any Portacath or PICC line.   We strive to give you quality time with your provider. You may need to reschedule your appointment if you arrive late (15 or more minutes).  Arriving late affects you and other patients whose appointments are after yours.  Also, if you miss three or more appointments without notifying the office, you may be dismissed from the clinic at the provider's discretion.      For prescription refill requests, have your pharmacy contact our office and allow 72 hours for refills to be completed.    Today you received the following chemotherapy and/or immunotherapy agent: Rituximab       To help prevent nausea and vomiting after your treatment, we encourage you to take your nausea medication as directed.  BELOW ARE SYMPTOMS THAT SHOULD BE REPORTED IMMEDIATELY: *FEVER GREATER THAN 100.4 F (38 C) OR HIGHER *CHILLS OR SWEATING *NAUSEA AND VOMITING THAT IS NOT CONTROLLED WITH YOUR NAUSEA MEDICATION *UNUSUAL SHORTNESS OF BREATH *UNUSUAL BRUISING OR BLEEDING *URINARY PROBLEMS (pain or burning when urinating, or frequent urination) *BOWEL PROBLEMS (unusual diarrhea, constipation, pain near the anus) TENDERNESS IN MOUTH AND THROAT WITH OR WITHOUT PRESENCE OF ULCERS (sore throat, sores in mouth, or a toothache) UNUSUAL RASH, SWELLING OR PAIN  UNUSUAL VAGINAL DISCHARGE OR ITCHING   Items with * indicate a potential emergency and should be followed up as soon as possible or go to the Emergency Department if any problems should occur.  Please show the CHEMOTHERAPY ALERT CARD or  IMMUNOTHERAPY ALERT CARD at check-in to the Emergency Department and triage nurse.  Should you have questions after your visit or need to cancel or reschedule your appointment, please contact [facility] - A DEPT OF [facility]  Dept: [phone]  and follow the prompts.  Office hours are 8:00 a.m. to 4:30 p.m. Monday - Friday. Please note that voicemails left after 4:00 p.m. may not be returned until the following business day.  We are closed weekends and major holidays. You have access to a nurse at all times for urgent questions. Please call the main number to the clinic Dept: [phone] and follow the prompts.   For any non-urgent questions, you may also contact your provider using MyChart. We now offer e-Visits for anyone 75 and older to request care online for non-urgent symptoms. For details visit [email or web address].   Also download the MyChart app! Go to the app store, search MyChart, open the app, select [place], and log in with your MyChart username and password.  Rituximab  Injection What is this medication? RITUXIMAB  (ri TUX i mab) treats leukemia and lymphoma. It works by blocking a protein that causes cancer cells to grow and multiply. This helps to slow or stop the spread of cancer cells. It may also be used to treat autoimmune conditions, such as arthritis. It works by slowing down an overactive immune system. It is a monoclonal antibody. This medicine may be used for other purposes; ask your health care provider or pharmacist if you have questions. COMMON BRAND NAME(S): RIABNI , Rituxan , RUXIENCE , truxima  What should  I tell my care team before I take this medication? They need to know if you have any of these conditions: Chest pain Heart disease Immune system problems Infection, such as chickenpox, cold sores, hepatitis B, herpes Irregular heartbeat or rhythm Kidney disease Low blood counts, such as low white cells, platelets, red  cells Lung disease Recent or upcoming vaccine An unusual or allergic reaction to rituximab , other medications, foods, dyes, or preservatives Pregnant or trying to get pregnant Breast-feeding How should I use this medication? This medication is injected into a vein. It is given by a care team in a hospital or clinic setting. A special MedGuide will be given to you before each treatment. Be sure to read this information carefully each time. Talk to your care team about the use of this medication in children. While this medication may be prescribed for children as young as 6 months for selected conditions, precautions do apply. Overdosage: If you think you have taken too much of this medicine contact a poison control center or emergency room at once. NOTE: This medicine is only for you. Do not share this medicine with others. What if I miss a dose? Keep appointments for follow-up doses. It is important not to miss your dose. Call your care team if you are unable to keep an appointment. What may interact with this medication? Do not take this medication with any of the following: Live vaccines This medication may also interact with the following: Cisplatin This list may not describe all possible interactions. Give your health care provider a list of all the medicines, herbs, non-prescription drugs, or dietary supplements you use. Also tell them if you smoke, drink alcohol, or use illegal drugs. Some items may interact with your medicine. What should I watch for while using this medication? Your condition will be monitored carefully while you are receiving this medication. You may need blood work while taking this medication. This medication can cause serious infusion reactions. To reduce the risk your care team may give you other medications to take before receiving this one. Be sure to follow the directions from your care team. This medication may increase your risk of getting an infection. Call  your care team for advice if you get a fever, chills, sore throat, or other symptoms of a cold or flu. Do not treat yourself. Try to avoid being around people who are sick. Call your care team if you are around anyone with measles, chickenpox, or if you develop sores or blisters that do not heal properly. Avoid taking medications that contain aspirin, acetaminophen , ibuprofen , naproxen, or ketoprofen unless instructed by your care team. These medications may hide a fever. This medication may cause serious skin reactions. They can happen weeks to months after starting the medication. Contact your care team right away if you notice fevers or flu-like symptoms with a rash. The rash may be red or purple and then turn into blisters or peeling of the skin. You may also notice a red rash with swelling of the face, lips, or lymph nodes in your neck or under your arms. In some patients, this medication may cause a serious brain infection that may cause death. If you have any problems seeing, thinking, speaking, walking, or standing, tell your care team right away. If you cannot reach your care team, urgently seek another source of medical care. Talk to your care team if you may be pregnant. Serious birth defects can occur if you take this medication during pregnancy and for  12 months after the last dose. You will need a negative pregnancy test before starting this medication. Contraception is recommended while taking this medication and for 12 months after the last dose. Your care team can help you find the option that works for you. Do not breastfeed while taking this medication and for at least 6 months after the last dose. What side effects may I notice from receiving this medication? Side effects that you should report to your care team as soon as possible: Allergic reactions or angioedema--skin rash, itching or hives, swelling of the face, eyes, lips, tongue, arms, or legs, trouble swallowing or  breathing Bowel blockage--stomach cramping, unable to have a bowel movement or pass gas, loss of appetite, vomiting Dizziness, loss of balance or coordination, confusion or trouble speaking Heart attack--pain or tightness in the chest, shoulders, arms, or jaw, nausea, shortness of breath, cold or clammy skin, feeling faint or lightheaded Heart rhythm changes--fast or irregular heartbeat, dizziness, feeling faint or lightheaded, chest pain, trouble breathing Infection--fever, chills, cough, sore throat, wounds that don't heal, pain or trouble when passing urine, general feeling of discomfort or being unwell Infusion reactions--chest pain, shortness of breath or trouble breathing, feeling faint or lightheaded Kidney injury--decrease in the amount of urine, swelling of the ankles, hands, or feet Liver injury--right upper belly pain, loss of appetite, nausea, light-colored stool, dark yellow or brown urine, yellowing skin or eyes, unusual weakness or fatigue Redness, blistering, peeling, or loosening of the skin, including inside the mouth Stomach pain that is severe, does not go away, or gets worse Tumor lysis syndrome (TLS)--nausea, vomiting, diarrhea, decrease in the amount of urine, dark urine, unusual weakness or fatigue, confusion, muscle pain or cramps, fast or irregular heartbeat, joint pain Side effects that usually do not require medical attention (report to your care team if they continue or are bothersome): Headache Joint pain Nausea Runny or stuffy nose Unusual weakness or fatigue This list may not describe all possible side effects. Call your doctor for medical advice about side effects. You may report side effects to FDA at 1-800-FDA-1088. Where should I keep my medication? This medication is given in a hospital or clinic. It will not be stored at home. NOTE: This sheet is a summary. It may not cover all possible information. If you have questions about this medicine, talk to your  doctor, pharmacist, or health care provider.  2024 Elsevier/Gold Standard (2021-06-14 00:00:00)Rituximab ; Hyaluronidase Injection

## 2024-01-13 NOTE — Progress Notes (Signed)
 Pt here for 4th Rituximab  infusion. He reacted with his first infusion of Rituxin but finished it after receiving emergency meds. Has since had two infusions here with slow titration (50mg  increments) and no symptoms. Discussed with Dr. Autumn via secure chat and he advises pt may start titration at 100mg /hour today and increase by 100mg  increments. Discussed with pt and he agrees to proceed. He feels well at present and has a call bell in reach should he feel any different than he does prior to infusion starting.

## 2024-01-13 NOTE — Assessment & Plan Note (Addendum)
 Please review oncology history for additional details and timeline of events.  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  He was lost to follow-up with oncology at outside facility.  Hospitalized on 12/16/2023 with progressive shortness of breath and was found to have significant right-sided pleural effusion that needed PleurX catheter placement with drainage.  Overall clinical picture was consistent with progression of MALT lymphoma driving the pleural effusions.  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 Night sweats are likely due to the lymphoma and may persist post-treatment. Hypercalcemia is likely a direct consequence of the lymphoma. Pleural effusion is attributed to the lymphoma, with small fluid still present in the right base. Abdominal tumor in the terminal ileum measures approximately 6 cm. Radiation therapy is avoided due to potential bowel complications. Rituximab  is expected to shrink the abdominal tumor.   He was started on rituximab  treatment on 12/24/2023 when he was in the hospital.  He had infusion reaction with severe rigors, which necessitated additional allergy medicines and morphine .  Following this intervention, he was able to successfully complete rituximab  and did not experience any other issues.  CT chest without contrast on 01/08/2024 showed overall improvement in aeration.  Stable mediastinal lymphadenopathy.  Rituximab  has been effective in preventing rapid fluid accumulation and maintaining stable lymph node size. Plan to continue current management.  Due for dose # 4 of rituximab  today.  Labs showed no dose-limiting toxicities.  Will proceed with rituximab  at a standard rate with extra allergy medicines before and also additional Benadryl  halfway through the infusion.    This will complete planned course of rituximab .  Request admitted for  restaging CT chest, abdomen and pelvis in mid January 2026.  I will see him in clinic with results.  If inadequate response is noted, we will discuss alternative options of R-CHOP or BR regimen.  - Continue to monitor calcium levels due to fluctuations.  He was advised to maintain adequate hydration.

## 2024-01-13 NOTE — Progress Notes (Signed)
 [facility]  ONCOLOGY CLINIC PROGRESS NOTE   Patient Care Team: Beam, Lamar POUR, MD as PCP - General (Family Medicine) Sheldon Standing, MD as Consulting Physician (General Surgery) Alvaro Ricardo KATHEE Raddle., MD as Consulting Physician (Urology) Pyrtle, Gordy HERO, MD as Consulting Physician (Gastroenterology)  PATIENT NAME: Timothy Newton   MR#: [id number] DOB: 1960/11/03  Date of visit: 01/13/2024   ASSESSMENT & PLAN:   Timothy Newton is a 63 y.o. gentleman with a past medical history of hypertension, PUD, MALT lymphoma originally diagnosed in 2006, previously untreated, was admitted to [facility] on 12/16/2023 after he presented with shortness of breath and was found to have a pleural effusion, presumed to be from MALT lymphoma progression.   MALT lymphoma (HCC) Please review oncology history for additional details and timeline of events.  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  He was lost to follow-up with oncology at outside facility.  Hospitalized on 12/16/2023 with progressive shortness of breath and was found to have significant right-sided pleural effusion that needed PleurX catheter placement with drainage.  Overall clinical picture was consistent with progression of MALT lymphoma driving the pleural effusions.  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 Night sweats are likely due to the lymphoma and may persist post-treatment. Hypercalcemia is likely a direct consequence of the lymphoma. Pleural effusion is attributed to the lymphoma, with small fluid still present in the right base. Abdominal tumor in the terminal ileum measures approximately 6 cm. Radiation therapy is avoided due to potential bowel complications. Rituximab  is expected to shrink the abdominal tumor.   He was started on rituximab  treatment on 12/24/2023 when he was  in the hospital.  He had infusion reaction with severe rigors, which necessitated additional allergy medicines and morphine .  Following this intervention, he was able to successfully complete rituximab  and did not experience any other issues.  CT chest without contrast on 01/08/2024 showed overall improvement in aeration.  Stable mediastinal lymphadenopathy.  Rituximab  has been effective in preventing rapid fluid accumulation and maintaining stable lymph node size. Plan to continue current management.  Due for dose # 4 of rituximab  today.  Labs showed no dose-limiting toxicities.  Will proceed with rituximab  at a standard rate with extra allergy medicines before and also additional Benadryl  halfway through the infusion.    This will complete planned course of rituximab  .  Request admitted for restaging CT chest, abdomen and pelvis in mid January 2026.  I will see him in clinic with results.  If inadequate response is noted, we will discuss alternative options of R-CHOP or BR regimen.  - Continue to monitor calcium levels due to fluctuations.  He was advised to maintain adequate hydration.  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 Recent CT scan shows reduced fluid accumulation and improved lung aeration. No further intervention required as breathing has improved and no rapid fluid accumulation is observed. - Continue current management as no further intervention is required.    I reviewed lab results and outside records for this visit and discussed relevant results with the patient. Diagnosis, plan of care and treatment options were also discussed in detail with the patient. Opportunity provided to ask questions and answers provided to his apparent satisfaction. Provided instructions to call our clinic with any problems, questions or concerns prior to return visit. I recommended to continue follow-up with PCP  and  sub-specialists. He verbalized understanding and agreed with the plan.   NCCN guidelines have been consulted in the planning of this patient's care.  I spent a total of 42 minutes during this encounter with the patient including review of chart and various tests results, discussions about plan of care and coordination of care plan.   [name], MD  01/13/2024 2:13 PM  [facility] - A DEPT OF [facility] [address] [place] [address] Dept: [phone] Dept Fax: [phone]    CHIEF COMPLAINT/ REASON FOR VISIT:   MALT lymphoma originally diagnosed in 2006, now with progression with pleural effusion  Current Treatment: Rituximab  weekly, started from 12/24/2023.  Completed 4 doses as of 01/13/2024.  Following restaging imaging, if inadequate response is noted, plan for either R-CHOP or BR regimen.  INTERVAL HISTORY:    Discussed the use of AI scribe software for clinical note transcription with the patient, who gave verbal consent to proceed.  History of Present Illness  Timothy Newton is a 63 year old male with lymphoma who presents for follow-up regarding his treatment with rituximab .  He recently visited a pulmonologist and underwent a CT scan due to concerns about fluid accumulation in his lungs. The scan revealed a small amount of fluid, which has improved compared to previous scans. His breathing has improved, and no further intervention is needed for the fluid at this time.  He is currently on a treatment regimen with rituximab , having completed four doses, with today being the last of this initial course. He has tolerated the treatment well, with no side effects and feeling okay overall. The CT scan results indicate that while the fluid in his lungs has decreased, the size of the lymph nodes has remained stable without significant shrinkage. However, he notes that the lymph nodes have not  grown larger, which he considers a positive outcome.  He maintains hydration and a balanced diet, including greens, fruits, and vegetables, with meat in moderation.     I have reviewed the past medical history, past surgical history, social history and family history with the patient and they are unchanged from previous note.  HISTORY OF PRESENT ILLNESS:   ONCOLOGY HISTORY:   He has been following up with his PCP at [facility].  He presented to an urgent care facility in September 2025 with exertional shortness of breath/bronchospasm.  Chest x-ray at that time showed right upper lobe lung mass.   He has remote history of MALT lymphoma in 2006, for which he was on surveillance only.  Given chest x-ray findings of right upper lobe lung mass, CT chest with contrast was obtained by his PCP and on 10/28/2023 showed large right-sided pleural effusion, multifocal areas of consolidation in the right lung including a masslike area in the right apex concerning for malignancy.  Other areas were more suggestive of consolidation and/or atelectasis.  Multifocal smaller opacities in the left lung.  Findings concerning for neoplasm or infection.  Small mediastinal lymph nodes.  Prominent right diaphragmatic lymph node.  With these findings, he was referred to pulmonology.  On review of records, he has had right upper lobe lung nodule since 2013 and was lost to follow-up.  He was seen by Dr. Javaid on 11/03/2023 and plan made for thoracentesis and PET scan.   On 11/09/2023, he presented to [facility] ED and Dr. Toribio Sharps with pulmonology performed thoracentesis.  Pleural fluid cytology showed no malignant  cells.  Showed findings consistent with chronic inflammation.   On 12/01/2023, staging PET scan at [facility] health showed hypermetabolic bilateral consolidation or neoplasm. The changes are increased compared to 10/28/2023. Consider bronchoscopy for further evaluation. Large right effusion with one area of  hypermetabolic activity posteriorly. Hypermetabolic distal ileal mass. Possibly lymphoma Hypermetabolic mesenteric nodes.    Since clinical picture remained concerning for malignancy, additional workup was planned in the outpatient setting.  Patient was seen in pulmonology clinic by Dr. Tamala Patch on 12/15/2023.  Since he did not have significant improvement after previous thoracentesis, recommended ER evaluation for admission/further management including chest tube placement.    On 12/16/2023, he underwent pleural catheter insertion.  On 12/18/2023, Dr. Toribio Sharps performed flexible bronchoscopy, EBUS and biopsy.  He had unique anatomy and had to undergo transbronchial biopsy by EBUS along apical wall of right upper lobe.  Pathology from [facility] from right upper lobe lung nodule/lymph node came back positive for malignant cells.  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  This is consistent with patient's history of MALT lymphoma.  Hence we were consulted for additional recommendations.   Reviewed CT chest abdomen pelvis from 12/17/2023.  It showed diminished volume of a loculated right pleural effusion containing numerous small air loculations, pigtail catheter with formed pigtail in the dependent right pleural space. Very dense consolidation of the right upper lobe, not significantly changed, with additional very dense consolidation of the right middle lobe also not significantly changed. There is however somewhat improved aeration of the right lower lobe secondary to diminished effusion volume. Additional dense consolidation of the posterior suprahilar left upper lobe is not significantly changed, nor are numerous scattered irregular opacities throughout the remainder of the aerated portions of the lungs. Constellation of pulmonary findings is suggestive of pulmonary lymphomatous involvement especially inasmuch as right middle lobe consolidation was  present on a relatively remote CT examination of the abdomen pelvis dated 2024. Unchanged enlarged mediastinal lymph nodes. 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 Pancolonic diverticulosis without evidence of acute diverticulitis.   Clinical picture was consistent with  MALT lymphoma, which was previously untreated but now is causing complications including driving pleural effusions.  This warranted treatment initiation.     Since patient remained symptomatic from pleural effusion and it seemed to arise from MALT lymphoma, inpatient chemotherapy was initiated with rituximab , starting from 12/24/2023.  He had infusion reaction with severe rigors, necessitating additional allergy medications and morphine .  He was able to finish rituximab  infusion with this intervention and remained asymptomatic afterwards.   Completed 4 doses of weekly rituximab  as of 01/13/2024.  Plan to obtain restaging imaging in January 2026 and if inadequate response is noted, we will plan for R-CHOP for BR regimen.  Oncology History  MALT lymphoma (HCC)  05/23/2011 Initial Diagnosis   MALT lymphoma (HCC)   12/24/2023 -  Chemotherapy   Patient is on Treatment Plan : NON-HODGKINS LYMPHOMA Rituximab  q7d     12/30/2023 Cancer Staging   Staging form: Hodgkin and Non-Hodgkin Lymphoma, AJCC 8th Edition - Clinical: Stage III (Marginal zone lymphoma) - Signed by Autumn Millman, MD on 12/30/2023       REVIEW OF SYSTEMS:   Review of Systems - Oncology  All other pertinent systems were reviewed with the patient and are negative.  ALLERGIES: He is allergic to rituximab .  MEDICATIONS:  Current Outpatient Medications  Medication Sig Dispense  Refill   acetaminophen  (TYLENOL ) 325 MG tablet Take 2 tablets (650 mg total) by mouth every 6 (six) hours as needed for mild pain (pain score 1-3) or fever (or Fever >/= 101).     albuterol  (VENTOLIN  HFA) 108 (90 Base)  MCG/ACT inhaler Inhale 2 puffs into the lungs.     lisinopril  (ZESTRIL ) 20 MG tablet Take 30 mg by mouth daily.     oxyCODONE -acetaminophen  (PERCOCET) 5-325 MG tablet Take 1 tablet by mouth every 6 (six) hours as needed. (Patient not taking: Reported on 01/13/2024) 12 tablet 0   No current facility-administered medications for this visit.   Facility-Administered Medications Ordered in Other Visits  Medication Dose Route Frequency Provider Last Rate Last Admin   0.9 %  sodium chloride  infusion   Intravenous Continuous Brenyn Petrey, MD 10 mL/hr at 01/13/24 1005 New Bag at 01/13/24 1005   diphenhydrAMINE  (BENADRYL ) injection 50 mg  50 mg Intravenous Once Victory Strollo, MD         VITALS:   Blood pressure 136/82, pulse 93, temperature 98 F (36.7 C), temperature source Temporal, resp. rate 16, weight 236 lb 11.2 oz (107.4 kg), SpO2 96%.  Wt Readings from Last 3 Encounters:  01/13/24 236 lb 11.2 oz (107.4 kg)  01/12/24 239 lb (108.4 kg)  01/05/24 234 lb 1.6 oz (106.2 kg)    Body mass index is 30.39 kg/m.    Onc Performance Status - 01/13/24 0851       ECOG Perf Status   ECOG Perf Status Fully active, able to carry on all pre-disease performance without restriction      KPS SCALE   KPS % SCORE Normal, no compliants, no evidence of disease          PHYSICAL EXAM:   Physical Exam Constitutional:      General: He is not in acute distress.    Appearance: Normal appearance.  HENT:     Head: Normocephalic and atraumatic.  Eyes:     Conjunctiva/sclera: Conjunctivae normal.  Cardiovascular:     Rate and Rhythm: Normal rate and regular rhythm.  Pulmonary:     Effort: Pulmonary effort is normal. No respiratory distress.     Comments: Decreased breath sounds in right base Abdominal:     General: There is no distension.  Neurological:     General: No focal deficit present.     Mental Status: He is alert and oriented to person, place, and time.  Psychiatric:        Mood and  Affect: Mood normal.        Behavior: Behavior normal.       LABORATORY DATA:   I have reviewed the data as listed.  Results for orders placed or performed in visit on 01/13/24  Uric acid  Result Value Ref Range   Uric Acid, Serum 7.6 3.7 - 8.6 mg/dL  Lactate dehydrogenase  Result Value Ref Range   LDH 182 105 - 235 U/L  CMP (Cancer Center only)  Result Value Ref Range   Sodium 138 135 - 145 mmol/L   Potassium 4.0 3.5 - 5.1 mmol/L   Chloride 101 98 - 111 mmol/L   CO2 26 22 - 32 mmol/L   Glucose, Bld 105 (H) 70 - 99 mg/dL   BUN 12 8 - 23 mg/dL   Creatinine 8.86 9.38 - 1.24 mg/dL   Calcium 88.9 (H) 8.9 - 10.3 mg/dL   Total Protein 9.0 (H) 6.5 - 8.1 g/dL   Albumin 3.9 3.5 -  5.0 g/dL   AST 17 15 - 41 U/L   ALT 15 0 - 44 U/L   Alkaline Phosphatase 76 38 - 126 U/L   Total Bilirubin 1.3 (H) 0.0 - 1.2 mg/dL   GFR, Estimated >39 >39 mL/min   Anion gap 11 5 - 15  CBC with Differential (Cancer Center Only)  Result Value Ref Range   WBC Count 7.5 4.0 - 10.5 K/uL   RBC 4.48 4.22 - 5.81 MIL/uL   Hemoglobin 12.8 (L) 13.0 - 17.0 g/dL   HCT 59.4 60.9 - 47.9 %   MCV 90.4 80.0 - 100.0 fL   MCH 28.6 26.0 - 34.0 pg   MCHC 31.6 30.0 - 36.0 g/dL   RDW 86.1 88.4 - 84.4 %   Platelet Count 212 150 - 400 K/uL   nRBC 0.0 0.0 - 0.2 %   Neutrophils Relative % 66 %   Neutro Abs 5.0 1.7 - 7.7 K/uL   Lymphocytes Relative 21 %   Lymphs Abs 1.5 0.7 - 4.0 K/uL   Monocytes Relative 9 %   Monocytes Absolute 0.7 0.1 - 1.0 K/uL   Eosinophils Relative 3 %   Eosinophils Absolute 0.2 0.0 - 0.5 K/uL   Basophils Relative 1 %   Basophils Absolute 0.1 0.0 - 0.1 K/uL   Immature Granulocytes 0 %   Abs Immature Granulocytes 0.03 0.00 - 0.07 K/uL      RADIOGRAPHIC STUDIES:  I have personally reviewed the radiological images as listed and agree with the findings in the report.  CT Chest Wo Contrast CLINICAL DATA:  MALT lymphoma. Malignant neoplasm of right lung. Evaluate for pleural  effusion.  EXAM: CT CHEST WITHOUT CONTRAST  TECHNIQUE: Multidetector CT imaging of the chest was performed following the standard protocol without IV contrast.  RADIATION DOSE REDUCTION: This exam was performed according to the departmental dose-optimization program which includes automated exposure control, adjustment of the mA and/or kV according to patient size and/or use of iterative reconstruction technique.  COMPARISON:  Chest CT 11/12/2025z  FINDINGS: Cardiovascular: Normal caliber of the thoracic aorta. Heart size is normal. No significant pericardial effusion.  Mediastinum/Nodes: Again noted are multiple small mediastinal lymph nodes. Index lymph node in the right paratracheal region on image 62, sequence 2 measures 1.2 cm in the short axis and minimally changed from 12/17/2023. No significant axillary lymph node enlargement. Limited evaluation for hilar lymph node enlargement without intravascular contrast. There are calcifications in the right hilar region. Stable prominent lymph node in the right cardiophrenic region measures 1.2 cm in the short axis on image 124, sequence 2.  Lungs/Pleura: Scattered trace pleural fluid at the right lung base. Loculated pleural effusion in the posterior right chest has resolved since the previous CT. There is improved aeration in the right lower lobe since the previous CT. Again noted is large area of mass-like consolidation involving the right suprahilar region extending into the right apex. Air bronchograms in the right upper lobe consolidation. There may be a small amount of lung necrosis at the right lung apex. Again noted is mass-like consolidation in the right middle lobe and similar to the previous examination. Small nodule in the right lower lobe on image 108, sequence 6. Suspicious areas of nodularity in the lateral right upper lobe on image 47/6. Improved aeration at the lateral right lung apex. Persistent  focal consolidation in the posteromedial left upper lobe. Stable patchy densities in the periphery of the left upper lobe on image 95. Again noted are patchy  peribronchial and septal opacities at the left lung apex. Small amount of parenchymal disease in left lower lobe superior segment is unchanged. No left pleural effusion.  Upper Abdomen: Images of the upper abdomen are unremarkable.  Musculoskeletal: Lucency involving the right glenoid could represent subchondral cysts and degenerative changes. Disc space narrowing and endplate changes in lower cervical spine. No suspicious osseous lesions.  IMPRESSION: 1. Persistent large areas of mass-like consolidation in the right lung involving the right upper lobe apex and right middle lobe. Aeration in the right lung has slightly improved since 12/17/2023, particularly in the right lower lobe. Persistent patchy and nodular densities in left lung. These areas of lung consolidation and nodularity are suggestive for a neoplastic process. There may be a component of postobstructive pneumonitis since the aeration in the right lung has slightly improved. 2. Decreased loculated right pleural effusion. Trace residual right pleural fluid. 3. No significant change in the prominent mediastinal lymph nodes.  Electronically Signed   By: Juliene Balder M.D.   On: 01/12/2024 10:23    CODE STATUS:  Code Status History     Date Active Date Inactive Code Status Order ID Comments User Context   12/16/2023 1305 12/25/2023 2101 Full Code [id number]  Seena Marsa NOVAK, MD ED    Questions for Most Recent Historical Code Status (Order [id number])     Question Answer   By: Consent: discussion documented in EHR            Orders Placed This Encounter  Procedures   CT CHEST ABDOMEN PELVIS W CONTRAST    Standing Status:   Future    Expected Date:   02/23/2024    Expiration Date:   01/12/2025    If indicated for the ordered procedure, I authorize the  administration of contrast media per Radiology protocol:   Yes    Does the patient have a contrast media/X-ray dye allergy?:   No    Preferred imaging location?:   [facility]    If indicated for the ordered procedure, I authorize the administration of oral contrast media per Radiology protocol:   Yes   CBC with Differential (Cancer Center Only)    Standing Status:   Future    Expiration Date:   01/12/2025   CMP (Cancer Center only)    Standing Status:   Future    Expiration Date:   01/12/2025   Lactate dehydrogenase    Standing Status:   Future    Expiration Date:   01/12/2025   Kappa/lambda light chains    Standing Status:   Future    Expiration Date:   01/12/2025   Multiple Myeloma Panel (SPEP&IFE w/QIG)    Standing Status:   Future    Expiration Date:   01/12/2025     Future Appointments  Date Time Provider Department Center  01/20/2024  8:30 AM [facility] PHLEBOTOMIST [facility] None  01/20/2024  9:00 AM Ranen Doolin, Chinita, MD [facility] None  01/20/2024  9:30 AM [facility] INFUSION [facility] None  04/21/2024 11:00 AM Adrien Winfred Berke, MD [facility] [phone] LELON Das     This document was completed utilizing speech recognition software. Grammatical errors, random word insertions, pronoun errors, and incomplete sentences are an occasional consequence of this system due to software limitations, ambient noise, and hardware issues. Any formal questions or concerns about the content, text or information contained within the body of this dictation should be directly addressed to the provider for clarification.

## 2024-01-13 NOTE — Progress Notes (Signed)
 Patient seen by Dr. Archie Patten Pasam today  Vitals are within treatment parameters:Yes   Labs are within treatment parameters: Yes   Treatment plan has been signed: Yes   Per physician team, Patient is ready for treatment and there are NO modifications to the treatment plan.

## 2024-01-13 NOTE — Assessment & Plan Note (Signed)
 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 Recent CT scan shows reduced fluid accumulation and improved lung aeration. No further intervention required as breathing has improved and no rapid fluid accumulation is observed. - Continue current management as no further intervention is required.

## 2024-01-20 ENCOUNTER — Inpatient Hospital Stay: Admitting: Oncology

## 2024-01-20 ENCOUNTER — Inpatient Hospital Stay

## 2024-02-13 ENCOUNTER — Other Ambulatory Visit (HOSPITAL_COMMUNITY)

## 2024-02-16 ENCOUNTER — Encounter: Payer: Self-pay | Admitting: Oncology

## 2024-02-23 ENCOUNTER — Encounter: Payer: Self-pay | Admitting: Oncology

## 2024-02-26 ENCOUNTER — Telehealth: Payer: Self-pay

## 2024-02-26 ENCOUNTER — Ambulatory Visit (HOSPITAL_BASED_OUTPATIENT_CLINIC_OR_DEPARTMENT_OTHER)
Admission: RE | Admit: 2024-02-26 | Discharge: 2024-02-26 | Disposition: A | Discharge status: Home / Self Care | Source: Ambulatory Visit | Attending: Oncology | Admitting: Oncology

## 2024-02-26 DIAGNOSIS — C884 Extranodal marginal zone b-cell lymphoma of mucosa-associated lymphoid tissue (malt-lymphoma) not having achieved remission: Secondary | ICD-10-CM | POA: Diagnosis present

## 2024-02-26 MED ORDER — IOHEXOL 300 MG/ML  SOLN
100.0000 mL | Freq: Once | INTRAMUSCULAR | Status: AC | PRN
  Administered 2024-02-26: 100 mL via INTRAVENOUS

## 2024-02-26 NOTE — Telephone Encounter (Signed)
 Copied from CRM [id number]. Topic: General - Other >> Feb 26, 2024 11:17 AM Timothy Newton wrote: Patient is calling back concerning speaking with dr adrien or the nurse . Tried calling cal but no response . Going to call again . Please give patient a call . No one was able to answer the phone  [phone]

## 2024-02-26 NOTE — Telephone Encounter (Signed)
 Copied from CRM [id number]. 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  Paperwork should be signed by Dr Jovita and go to [facility]: [phone]. Patient would also like a copy of the letter emailed to him: [email or web address].  Please call patient @ [phone]. >> Feb 26, 2024  8:48 AM Joesph PARAS wrote: To additionally document, patient placed demand that this be completed today, where he is displeased it has not already been addressed. PAS contacted [facility] where spoke with Thersia, who states that (pr [place]), patient should receive a call back this afternoon.  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 Patient states he needs a letter written in Dr. Margaretann letterhead that patient is cleared to drive with no concerns.

## 2024-02-27 NOTE — Telephone Encounter (Signed)
 Copied from CRM [id number]. 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  Paperwork should be signed by Dr Jovita and go to [facility]: [phone]. Patient would also like a copy of the letter emailed to him: [email or web address].  Please call patient @ [phone]. >> Feb 27, 2024 11:19 AM Dedra B wrote: Patient calling again to follow up DOT clearance letter. He will be stopping by clinic today. >> Feb 26, 2024  1:40 PM Leonor MARLA Presume wrote: Updated Clinical telephone encounter and routed to clinical team for further advisement to resolve for patient.  >> Feb 26, 2024 11:17 AM Benton KIDD wrote: Patient is calling back concerning speaking with dr adrien or the nurse . Tried calling [facility] but no response . Going to call again . Please give patient a call . No one was able to answer the phone  [phone] >> Feb 26, 2024  8:48 AM Joesph PARAS wrote: To additionally document, patient placed demand that this be completed today, where he is displeased it has not already been addressed. PAS contacted [facility] where spoke with Thersia, who states that (pr [place]), patient should receive a call back this afternoon.  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 Patient states he needs a letter written in Dr. Margaretann letterhead that patient is cleared to drive with no concerns.

## 2024-03-02 ENCOUNTER — Inpatient Hospital Stay: Attending: Oncology

## 2024-03-02 ENCOUNTER — Encounter: Payer: Self-pay | Admitting: Oncology

## 2024-03-02 ENCOUNTER — Other Ambulatory Visit: Payer: Self-pay | Admitting: *Deleted

## 2024-03-02 ENCOUNTER — Inpatient Hospital Stay: Admitting: Oncology

## 2024-03-02 VITALS — BP 139/91 | HR 72 | Temp 98.8°F | Resp 15 | Wt 255.8 lb

## 2024-03-02 DIAGNOSIS — C884 Extranodal marginal zone b-cell lymphoma of mucosa-associated lymphoid tissue (malt-lymphoma) not having achieved remission: Secondary | ICD-10-CM

## 2024-03-02 LAB — CMP (CANCER CENTER ONLY)
ALT: 17 U/L (ref 0–44)
AST: 21 U/L (ref 15–41)
Albumin: 4 g/dL (ref 3.5–5.0)
Alkaline Phosphatase: 72 U/L (ref 38–126)
Anion gap: 13 (ref 5–15)
BUN: 14 mg/dL (ref 8–23)
CO2: 23 mmol/L (ref 22–32)
Calcium: 11.2 mg/dL — ABNORMAL HIGH (ref 8.9–10.3)
Chloride: 101 mmol/L (ref 98–111)
Creatinine: 1.13 mg/dL (ref 0.61–1.24)
GFR, Estimated: >60 mL/min
Glucose, Bld: 146 mg/dL — ABNORMAL HIGH (ref 70–99)
Potassium: 3.9 mmol/L (ref 3.5–5.1)
Sodium: 137 mmol/L (ref 135–145)
Total Bilirubin: 0.8 mg/dL (ref 0.0–1.2)
Total Protein: 9.1 g/dL — ABNORMAL HIGH (ref 6.5–8.1)

## 2024-03-02 LAB — CBC WITH DIFFERENTIAL (CANCER CENTER ONLY)
Abs Immature Granulocytes: 0.01 10*3/uL (ref 0.00–0.07)
Basophils Absolute: 0.1 10*3/uL (ref 0.0–0.1)
Basophils Relative: 1 %
Eosinophils Absolute: 0.2 10*3/uL (ref 0.0–0.5)
Eosinophils Relative: 3 %
HCT: 41.2 % (ref 39.0–52.0)
Hemoglobin: 13.2 g/dL (ref 13.0–17.0)
Immature Granulocytes: 0 %
Lymphocytes Relative: 27 %
Lymphs Abs: 1.6 10*3/uL (ref 0.7–4.0)
MCH: 29.3 pg (ref 26.0–34.0)
MCHC: 32 g/dL (ref 30.0–36.0)
MCV: 91.6 fL (ref 80.0–100.0)
Monocytes Absolute: 0.7 10*3/uL (ref 0.1–1.0)
Monocytes Relative: 11 %
Neutro Abs: 3.5 10*3/uL (ref 1.7–7.7)
Neutrophils Relative %: 58 %
Platelet Count: 190 10*3/uL (ref 150–400)
RBC: 4.5 MIL/uL (ref 4.22–5.81)
RDW: 13.3 % (ref 11.5–15.5)
Smear Review: NORMAL
WBC Count: 6.1 10*3/uL (ref 4.0–10.5)
nRBC: 0 % (ref 0.0–0.2)

## 2024-03-02 LAB — LACTATE DEHYDROGENASE: LDH: 279 U/L — ABNORMAL HIGH (ref 105–235)

## 2024-03-02 MED ORDER — LIDOCAINE-PRILOCAINE 2.5-2.5 % EX CREA: TOPICAL_CREAM | CUTANEOUS | 3 refills | Status: AC

## 2024-03-02 MED ORDER — DEXAMETHASONE 4 MG PO TABS: 8.0000 mg | ORAL_TABLET | Freq: Every day | ORAL | 1 refills | Status: AC

## 2024-03-02 MED ORDER — ACYCLOVIR 400 MG PO TABS: 400.0000 mg | ORAL_TABLET | Freq: Every day | ORAL | 3 refills | Status: AC

## 2024-03-02 MED ORDER — PROCHLORPERAZINE MALEATE 10 MG PO TABS: 10.0000 mg | ORAL_TABLET | Freq: Four times a day (QID) | ORAL | 1 refills | Status: AC | PRN

## 2024-03-02 MED ORDER — ALLOPURINOL 300 MG PO TABS: 300.0000 mg | ORAL_TABLET | Freq: Every day | ORAL | 3 refills | Status: AC

## 2024-03-02 MED ORDER — ONDANSETRON HCL 8 MG PO TABS: 8.0000 mg | ORAL_TABLET | Freq: Three times a day (TID) | ORAL | 1 refills | Status: AC | PRN

## 2024-03-02 NOTE — Assessment & Plan Note (Signed)
 Please review oncology history for additional details and timeline of events.  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  He was lost to follow-up with oncology at outside facility.  Hospitalized on 12/16/2023 with progressive shortness of breath and was found to have significant right-sided pleural effusion that needed PleurX catheter placement with drainage.  Overall clinical picture was consistent with progression of MALT lymphoma driving the pleural effusions.  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 Night sweats are likely due to the lymphoma and may persist post-treatment. Hypercalcemia is likely a direct consequence of the lymphoma. Pleural effusion is attributed to the lymphoma, with small fluid still present in the right base. Abdominal tumor in the terminal ileum measures approximately 6 cm. Radiation therapy is avoided due to potential bowel complications. Rituximab  is expected to shrink the abdominal tumor.   He was started on rituximab  treatment on 12/24/2023 when he was in the hospital.  He had infusion reaction with severe rigors, which necessitated additional allergy medicines and morphine .  Following this intervention, he was able to successfully complete rituximab  and did not experience any other issues.  CT chest without contrast on 01/08/2024 showed overall improvement in aeration.  Stable mediastinal lymphadenopathy.    Completed 4 doses of weekly rituximab  as of 01/13/2024.   On 02/26/2024, restaging CT chest, abdomen and pelvis showed suboptimal response with overall stable disease.  Plan is to proceed with BR regimen.  Briefly discussed side effect profile today including cytopenias, fatigue, nausea, infusion reactions etc.  Will give him extra allergy medicines with each rituximab  infusion as previously.  Plan for up to 6 cycles with restaging imaging  halfway.  Will arrange for Port-A-Cath placement.  Will also provide him with chemo education.   - Provided letter supporting maintenance of [employer] license and ability to continue work, with guidance on anticipated fatigue and timing of return to work post-chemotherapy. - Planned dose adjustments of chemotherapy based on blood count monitoring during treatment.  - Continue to monitor calcium levels due to fluctuations.  He was advised to maintain adequate hydration.

## 2024-03-02 NOTE — Progress Notes (Signed)
 "  [facility]  ONCOLOGY CLINIC PROGRESS NOTE   Patient Care Team: Beam, Lamar POUR, MD as PCP - General (Family Medicine) Sheldon Standing, MD as Consulting Physician (General Surgery) Alvaro Ricardo KATHEE Raddle., MD as Consulting Physician (Urology) Pyrtle, Gordy HERO, MD as Consulting Physician (Gastroenterology)  PATIENT NAME: Timothy Newton   MR#: [id number] DOB: Jun 25, 1960  Date of visit: 03/02/2024   ASSESSMENT & PLAN:   Timothy Newton is a 64 y.o. gentleman with a past medical history of hypertension, PUD, MALT lymphoma originally diagnosed in 2006, previously untreated, was admitted to [facility] on 12/16/2023 after he presented with shortness of breath and was found to have a pleural effusion, presumed to be from MALT lymphoma progression.   MALT lymphoma (HCC) Please review oncology history for additional details and timeline of events.  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  He was lost to follow-up with oncology at outside facility.  Hospitalized on 12/16/2023 with progressive shortness of breath and was found to have significant right-sided pleural effusion that needed PleurX catheter placement with drainage.  Overall clinical picture was consistent with progression of MALT lymphoma driving the pleural effusions.  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 Night sweats are likely due to the lymphoma and may persist post-treatment. Hypercalcemia is likely a direct consequence of the lymphoma. Pleural effusion is attributed to the lymphoma, with small fluid still present in the right base. Abdominal tumor in the terminal ileum measures approximately 6 cm. Radiation therapy is avoided due to potential bowel complications. Rituximab  is expected to shrink the abdominal tumor.   He was started on rituximab  treatment on 12/24/2023 when he was  in the hospital.  He had infusion reaction with severe rigors, which necessitated additional allergy medicines and morphine .  Following this intervention, he was able to successfully complete rituximab  and did not experience any other issues.  CT chest without contrast on 01/08/2024 showed overall improvement in aeration.  Stable mediastinal lymphadenopathy.    Completed 4 doses of weekly rituximab  as of 01/13/2024.   On 02/26/2024, restaging CT chest, abdomen and pelvis showed suboptimal response with overall stable disease.  Plan is to proceed with BR regimen.  Briefly discussed side effect profile today including cytopenias, fatigue, nausea, infusion reactions etc.  Will give him extra allergy medicines with each rituximab  infusion as previously.  Plan for up to 6 cycles with restaging imaging halfway.  Will arrange for Port-A-Cath placement.  Will also provide him with chemo education.   - Provided letter supporting maintenance of [employer] license and ability to continue work, with guidance on anticipated fatigue and timing of return to work post-chemotherapy. - Planned dose adjustments of chemotherapy based on blood count monitoring during treatment.  - Continue to monitor calcium levels due to fluctuations.  He was advised to maintain adequate hydration.  Hypercalcemia secondary to lymphoma Persistent mild hypercalcemia attributed to underlying lymphoma, currently asymptomatic. Management is focused on treating the lymphoma. - Monitored serum calcium levels during lymphoma treatment. - Addressed underlying lymphoma as primary management for hypercalcemia.   I reviewed lab results and outside records for this visit and discussed relevant results with the patient. Diagnosis, plan of care and treatment options were also discussed in detail with the patient. Opportunity provided to ask questions and answers provided to his apparent satisfaction. Provided instructions to call our clinic with any  problems, questions or concerns prior to  return visit. I recommended to continue follow-up with PCP and sub-specialists. He verbalized understanding and agreed with the plan.   NCCN guidelines have been consulted in the planning of this patients care.  I spent a total of 42 minutes during this encounter with the patient including review of chart and various tests results, discussions about plan of care and coordination of care plan.   [name], MD  03/02/2024 4:26 PM  [facility] - A DEPT OF [facility] [address] [place] [address] Dept: [phone] Dept Fax: [phone]    CHIEF COMPLAINT/ REASON FOR VISIT:   MALT lymphoma originally diagnosed in 2006, now with progression with pleural effusion  Current Treatment: Rituximab  weekly, started from 12/24/2023.  Completed 4 doses as of 01/13/2024.   Restaging CT chest, abdomen and pelvis on 02/26/2024 showed suboptimal response.  Plan is to proceed with BR regimen.  INTERVAL HISTORY:    Discussed the use of AI scribe software for clinical note transcription with the patient, who gave verbal consent to proceed.  History of Present Illness  Timothy Newton is a 64 year old male with extranodal marginal zone B-cell lymphoma involving the lungs and ileum who presents for oncology follow-up after completing four cycles of rituximab .  He completed four cycles of rituximab , with the last dose administered just over one month ago. Recent imaging demonstrates stable disease, with persistent pulmonary consolidations and bronchograms, as well as unchanged mediastinal and iliac lymphadenopathy. The volume of fluid has decreased compared to prior studies, but overall radiographic findings remain stable. He remains clinically stable, with no new or worsening symptoms since the last visit.  He denies dyspnea, cough, chest pain, fever, chills, or diaphoresis. He  does not experience fatigue or anorexia. His serum calcium remains elevated, with recent values of 11.2 and 10.8 mg/dL, but he has not developed symptoms attributable to hypercalcemia.  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     I have reviewed the past medical history, past surgical history, social history and family history with the patient and they are unchanged from previous note.  HISTORY OF PRESENT ILLNESS:   ONCOLOGY HISTORY:   He has been following up with his PCP at [facility].  He presented to an urgent care facility in September 2025 with exertional shortness of breath/bronchospasm.  Chest x-ray at that time showed right upper lobe lung mass.   He has remote history of MALT lymphoma in 2006, for which he was on surveillance only.  Given chest x-ray findings of right upper lobe lung mass, CT chest with contrast was obtained by his PCP and on 10/28/2023 showed large right-sided pleural effusion, multifocal areas of consolidation in the right lung including a masslike area in the right apex concerning for malignancy.  Other areas were more suggestive of consolidation and/or atelectasis.  Multifocal smaller opacities in the left lung.  Findings concerning for neoplasm or infection.  Small mediastinal lymph nodes.  Prominent right diaphragmatic lymph node.  With these findings, he was referred to pulmonology.  On review of records, he has had right upper lobe lung nodule since 2013 and was lost to follow-up.  He was seen by Dr. Javaid on 11/03/2023 and plan made for thoracentesis and PET scan.   On 11/09/2023, he presented to [facility] ED and Dr. Toribio Sharps with pulmonology performed thoracentesis.  Pleural fluid cytology showed no malignant cells.  Showed findings consistent with chronic inflammation.   On 12/01/2023, staging PET scan at [facility] showed hypermetabolic bilateral consolidation or  neoplasm. The changes are increased compared to 10/28/2023. Consider bronchoscopy for further evaluation. Large right effusion with one area of hypermetabolic activity posteriorly. Hypermetabolic distal ileal mass. Possibly lymphoma Hypermetabolic mesenteric nodes.    Since clinical picture remained concerning for malignancy, additional workup was planned in the outpatient setting.  Patient was seen in pulmonology clinic by Dr. Tamala Patch on 12/15/2023.  Since he did not have significant improvement after previous thoracentesis, recommended ER evaluation for admission/further management including chest tube placement.    On 12/16/2023, he underwent pleural catheter insertion.  On 12/18/2023, Dr. Toribio Sharps performed flexible bronchoscopy, EBUS and biopsy.  He had unique anatomy and had to undergo transbronchial biopsy by EBUS along apical wall of right upper lobe.  Pathology from [facility] from right upper lobe lung nodule/lymph node came back positive for malignant cells.  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  This is consistent with patient's history of MALT lymphoma.  Hence we were consulted for additional recommendations.   Reviewed CT chest abdomen pelvis from 12/17/2023.  It showed diminished volume of a loculated right pleural effusion containing numerous small air loculations, pigtail catheter with formed pigtail in the dependent right pleural space. Very dense consolidation of the right upper lobe, not significantly changed, with additional very dense consolidation of the right middle lobe also not significantly changed. There is however somewhat improved aeration of the right lower lobe secondary to diminished effusion volume. Additional dense consolidation of the posterior suprahilar left upper lobe is not significantly changed, nor are numerous scattered irregular opacities throughout the remainder of the aerated portions of the lungs.  Constellation of pulmonary findings is suggestive of pulmonary lymphomatous involvement especially inasmuch as right middle lobe consolidation was present on a relatively remote CT examination of the abdomen pelvis dated 2024. Unchanged enlarged mediastinal lymph nodes. 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 Pancolonic diverticulosis without evidence of acute diverticulitis.   Clinical picture was consistent with  MALT lymphoma, which was previously untreated but now is causing complications including driving pleural effusions.  This warranted treatment initiation.     Since patient remained symptomatic from pleural effusion and it seemed to arise from MALT lymphoma, inpatient chemotherapy was initiated with rituximab , starting from 12/24/2023.  He had infusion reaction with severe rigors, necessitating additional allergy medications and morphine .  He was able to finish rituximab  infusion with this intervention and remained asymptomatic afterwards.   Completed 4 doses of weekly rituximab  as of 01/13/2024.   On 02/26/2024, restaging CT chest, abdomen and pelvis showed suboptimal response with overall stable disease.  Plan is to proceed with BR regimen.  Oncology History  MALT lymphoma (HCC)  05/23/2011 Initial Diagnosis   MALT lymphoma (HCC)   12/24/2023 - 01/13/2024 Chemotherapy   Patient is on Treatment Plan : NON-HODGKINS LYMPHOMA Rituximab  q7d     12/30/2023 Cancer Staging   Staging form: Hodgkin and Non-Hodgkin Lymphoma, AJCC 8th Edition - Clinical: Stage III (Marginal zone lymphoma) - Signed by Autumn Millman, MD on 12/30/2023   03/16/2024 -  Chemotherapy   Patient is on Treatment Plan : NON-HODGKINS LYMPHOMA Rituximab  D1 + Bendamustine D1,2 q28d x 6 cycles         REVIEW OF SYSTEMS:   Review of Systems - Oncology  All other  pertinent systems were reviewed with the patient and are negative.  ALLERGIES: He is  allergic to rituximab .  MEDICATIONS:  Current Outpatient Medications  Medication Sig Dispense Refill   acetaminophen  (TYLENOL ) 325 MG tablet Take 2 tablets (650 mg total) by mouth every 6 (six) hours as needed for mild pain (pain score 1-3) or fever (or Fever >/= 101).     albuterol  (VENTOLIN  HFA) 108 (90 Base) MCG/ACT inhaler Inhale 2 puffs into the lungs.     lisinopril  (ZESTRIL ) 20 MG tablet Take 30 mg by mouth daily.     acyclovir  (ZOVIRAX ) 400 MG tablet Take 1 tablet (400 mg total) by mouth daily. 30 tablet 3   allopurinol  (ZYLOPRIM ) 300 MG tablet Take 1 tablet (300 mg total) by mouth daily. 30 tablet 3   dexamethasone  (DECADRON ) 4 MG tablet Take 2 tablets (8 mg total) by mouth daily. Start the day after bendamustine chemotherapy for 2 days. Take with food. 30 tablet 1   lidocaine -prilocaine  (EMLA ) cream Apply to affected area once 30 g 3   ondansetron  (ZOFRAN ) 8 MG tablet Take 1 tablet (8 mg total) by mouth every 8 (eight) hours as needed for nausea or vomiting. Start on the third day after chemotherapy. 30 tablet 1   oxyCODONE -acetaminophen  (PERCOCET) 5-325 MG tablet Take 1 tablet by mouth every 6 (six) hours as needed. (Patient not taking: Reported on 03/02/2024) 12 tablet 0   prochlorperazine  (COMPAZINE ) 10 MG tablet Take 1 tablet (10 mg total) by mouth every 6 (six) hours as needed for nausea or vomiting. 30 tablet 1   No current facility-administered medications for this visit.     VITALS:   Blood pressure (!) 139/91, pulse 72, temperature 98.8 F (37.1 C), temperature source Temporal, resp. rate 15, weight 255 lb 12.8 oz (116 kg), SpO2 96%.  Wt Readings from Last 3 Encounters:  03/02/24 255 lb 12.8 oz (116 kg)  01/13/24 236 lb 11.2 oz (107.4 kg)  01/12/24 239 lb (108.4 kg)    Body mass index is 32.84 kg/m.    Onc Performance Status - 03/02/24 1501       ECOG Perf Status   ECOG Perf Status Fully active, able to carry on all pre-disease performance without restriction       KPS SCALE   KPS % SCORE Normal, no compliants, no evidence of disease           PHYSICAL EXAM:   Physical Exam Constitutional:      General: He is not in acute distress.    Appearance: Normal appearance.  HENT:     Head: Normocephalic and atraumatic.  Eyes:     Conjunctiva/sclera: Conjunctivae normal.  Cardiovascular:     Rate and Rhythm: Normal rate and regular rhythm.  Pulmonary:     Effort: Pulmonary effort is normal. No respiratory distress.     Comments: Decreased breath sounds in right base Abdominal:     General: There is no distension.  Neurological:     General: No focal deficit present.     Mental Status: He is alert and oriented to person, place, and time.  Psychiatric:        Mood and Affect: Mood normal.        Behavior: Behavior normal.       LABORATORY DATA:   I have reviewed the data as listed.  Results for orders placed or performed in visit on 03/02/24  Lactate dehydrogenase  Result Value Ref Range   LDH 279 (H) 105 -  235 U/L  CMP (Cancer Center only)  Result Value Ref Range   Sodium 137 135 - 145 mmol/L   Potassium 3.9 3.5 - 5.1 mmol/L   Chloride 101 98 - 111 mmol/L   CO2 23 22 - 32 mmol/L   Glucose, Bld 146 (H) 70 - 99 mg/dL   BUN 14 8 - 23 mg/dL   Creatinine 8.86 9.38 - 1.24 mg/dL   Calcium 88.7 (H) 8.9 - 10.3 mg/dL   Total Protein 9.1 (H) 6.5 - 8.1 g/dL   Albumin 4.0 3.5 - 5.0 g/dL   AST 21 15 - 41 U/L   ALT 17 0 - 44 U/L   Alkaline Phosphatase 72 38 - 126 U/L   Total Bilirubin 0.8 0.0 - 1.2 mg/dL   GFR, Estimated >39 >39 mL/min   Anion gap 13 5 - 15  CBC with Differential (Cancer Center Only)  Result Value Ref Range   WBC Count 6.1 4.0 - 10.5 K/uL   RBC 4.50 4.22 - 5.81 MIL/uL   Hemoglobin 13.2 13.0 - 17.0 g/dL   HCT 58.7 60.9 - 47.9 %   MCV 91.6 80.0 - 100.0 fL   MCH 29.3 26.0 - 34.0 pg   MCHC 32.0 30.0 - 36.0 g/dL   RDW 86.6 88.4 - 84.4 %   Platelet Count 190 150 - 400 K/uL   nRBC 0.0 0.0 - 0.2 %   Neutrophils  Relative % 58 %   Neutro Abs 3.5 1.7 - 7.7 K/uL   Lymphocytes Relative 27 %   Lymphs Abs 1.6 0.7 - 4.0 K/uL   Monocytes Relative 11 %   Monocytes Absolute 0.7 0.1 - 1.0 K/uL   Eosinophils Relative 3 %   Eosinophils Absolute 0.2 0.0 - 0.5 K/uL   Basophils Relative 1 %   Basophils Absolute 0.1 0.0 - 0.1 K/uL   WBC Morphology See Note    RBC Morphology MORPHOLOGY UNREMARKABLE    Smear Review Normal platelet morphology    Immature Granulocytes 0 %   Abs Immature Granulocytes 0.01 0.00 - 0.07 K/uL   Abnormal Lymphocytes Present PRESENT        RADIOGRAPHIC STUDIES:  I have personally reviewed the radiological images as listed and agree with the findings in the report.  CT CHEST ABDOMEN PELVIS W CONTRAST CLINICAL DATA:  Restaging malt lymphoma * Tracking Code: BO *  EXAM: CT CHEST, ABDOMEN, AND PELVIS WITH CONTRAST  TECHNIQUE: Multidetector CT imaging of the chest, abdomen and pelvis was performed following the standard protocol during bolus administration of intravenous contrast.  RADIATION DOSE REDUCTION: This exam was performed according to the departmental dose-optimization program which includes automated exposure control, adjustment of the mA and/or kV according to patient size and/or use of iterative reconstruction technique.  CONTRAST:  OMNIPAQUE  IOHEXOL  300 MG/ML  SOLN  COMPARISON:  CT chest, 01/08/2024, CT chest abdomen pelvis, 12/17/2023  FINDINGS: CT CHEST FINDINGS  Cardiovascular: Scattered aortic atherosclerosis. Normal heart size. Left coronary artery calcifications. No pericardial effusion.  Mediastinum/Nodes: Numerous enlarged mediastinal lymph nodes, unchanged, index pretracheal node measuring 2.3 x 1.4 cm (series 2, image 27). Thyroid gland, trachea, and esophagus demonstrate no significant findings.  Lungs/Pleura: No significant change in appearance of the lungs, with very dense consolidations and air bronchograms seen in the apical right  upper lobe (series 3, image 63), lateral right middle lobe (series 3, image 106), and to a lesser extent the superior segment right lower lobe and posterior left upper lobe (series 3, image 77).  Additional scattered irregular consolidations in both lungs. Similar trace, loculated right pleural fluid.  Musculoskeletal: No chest wall abnormality. No acute osseous findings.  CT ABDOMEN PELVIS FINDINGS  Hepatobiliary: No solid liver abnormality is seen. No gallstones, gallbladder wall thickening, or biliary dilatation.  Pancreas: Unremarkable. No pancreatic ductal dilatation or surrounding inflammatory changes.  Spleen: Normal in size without significant abnormality.  Adrenals/Urinary Tract: Adrenal glands are unremarkable. Kidneys are normal, without renal calculi, solid lesion, or hydronephrosis. Bladder is unremarkable.  Stomach/Bowel: Stomach is within normal limits. Appendix appears normal. No significant change in a circumferential mass of the terminal ileum measuring 3.3 x 3.3 x 5.7 cm (series 2, image 105, series 4, image 63). No evidence of bowel obstruction. Pancolonic diverticulosis.  Vascular/Lymphatic: No significant vascular findings are present. No enlarged abdominal or pelvic lymph nodes.  Reproductive: Prostatomegaly.  Other: Small fat containing supraumbilical hernia.  No ascites.  Musculoskeletal: No acute osseous findings.  IMPRESSION: 1. No significant change in appearance of the lungs, with very dense consolidations and air bronchograms seen in the apical right upper lobe, lateral right middle lobe, and to a lesser extent the superior segment right lower lobe and posterior left upper lobe. Additional scattered irregular consolidations in both lungs. 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 2. Similar trace, loculated right pleural fluid. 3. Numerous enlarged mediastinal lymph nodes, unchanged. 4. No significant change in a  circumferential mass of the terminal ileum measuring 3.3 x 3.3 x 5.7 cm, in keeping with MALT lymphoma. 5. Prostatomegaly. 6. Colonic diverticulosis. 7. Coronary artery disease.  Aortic Atherosclerosis (ICD10-I70.0).  Electronically Signed   By: Marolyn JONETTA Jaksch M.D.   On: 02/28/2024 06:25    CODE STATUS:  Code Status History     Date Active Date Inactive Code Status Order ID Comments User Context   12/16/2023 1305 12/25/2023 2101 Full Code [id number]  Seena Marsa NOVAK, MD ED    Questions for Most Recent Historical Code Status (Order [id number])     Question Answer   By: Consent: discussion documented in EHR            Orders Placed This Encounter  Procedures   Consent Attestation for Oncology Treatment    The patient is informed of risks, benefits, side-effects of the prescribed oncology treatment. Potential short term and long term side effects and response rates discussed. After a long discussion, the patient made informed decision to proceed.:   Yes   CBC with Differential (Cancer Center Only)    Standing Status:   Future    Expected Date:   03/16/2024    Expiration Date:   03/16/2025   CMP (Cancer Center only)    Standing Status:   Future    Expected Date:   03/16/2024    Expiration Date:   03/16/2025   CBC with Differential (Cancer Center Only)    Standing Status:   Future    Expected Date:   04/13/2024    Expiration Date:   04/13/2025   CMP (Cancer Center only)    Standing Status:   Future    Expected Date:   04/13/2024    Expiration Date:   04/13/2025   CBC with Differential (Cancer Center Only)    Standing Status:   Future    Expected Date:   05/11/2024    Expiration Date:   05/11/2025   CMP (Cancer Center only)    Standing Status:   Future    Expected Date:   05/11/2024    Expiration Date:  05/11/2025   ONCBCN PHYSICIAN COMMUNICATION 1    For pathway [id number]: treatment x 6 Cycles (or 2 Cycles Beyond CR).   PHYSICIAN COMMUNICATION ORDER    Hepatitis B Virus  screening with HBsAg and anti-HBc recommended prior to treatment with rituximab , ofatumumab, or obinutuzumab.     Future Appointments  Date Time Provider Department Center  04/21/2024 11:00 AM Adrien Winfred Berke, MD [facility] [phone] LELON Das     This document was completed utilizing speech recognition software. Grammatical errors, random word insertions, pronoun errors, and incomplete sentences are an occasional consequence of this system due to software limitations, ambient noise, and hardware issues. Any formal questions or concerns about the content, text or information contained within the body of this dictation should be directly addressed to the provider for clarification.   "

## 2024-03-03 LAB — KAPPA/LAMBDA LIGHT CHAINS
Kappa free light chain: 11.3 mg/L (ref 3.3–19.4)
Kappa, lambda light chain ratio: 0.16 — ABNORMAL LOW (ref 0.26–1.65)
Lambda free light chains: 69.7 mg/L — ABNORMAL HIGH (ref 5.7–26.3)

## 2024-03-04 ENCOUNTER — Encounter: Payer: Self-pay | Admitting: *Deleted

## 2024-03-04 LAB — MULTIPLE MYELOMA PANEL, SERUM
Albumin SerPl Elph-Mcnc: 3.5 g/dL (ref 2.9–4.4)
Albumin/Glob SerPl: 0.7 (ref 0.7–1.7)
Alpha 1: 0.2 g/dL (ref 0.0–0.4)
Alpha2 Glob SerPl Elph-Mcnc: 0.8 g/dL (ref 0.4–1.0)
B-Globulin SerPl Elph-Mcnc: 1 g/dL (ref 0.7–1.3)
Gamma Glob SerPl Elph-Mcnc: 3.1 g/dL — ABNORMAL HIGH (ref 0.4–1.8)
Globulin, Total: 5.1 g/dL — ABNORMAL HIGH (ref 2.2–3.9)
IgA: 308 mg/dL (ref 61–437)
IgG (Immunoglobin G), Serum: 1047 mg/dL (ref 603–1613)
IgM (Immunoglobulin M), Srm: 3089 mg/dL — ABNORMAL HIGH (ref 20–172)
M Protein SerPl Elph-Mcnc: 2.5 g/dL — ABNORMAL HIGH
Total Protein ELP: 8.6 g/dL — ABNORMAL HIGH (ref 6.0–8.5)

## 2024-03-05 ENCOUNTER — Other Ambulatory Visit: Payer: Self-pay | Admitting: Radiology

## 2024-03-08 ENCOUNTER — Other Ambulatory Visit: Payer: Self-pay

## 2024-03-08 ENCOUNTER — Encounter (HOSPITAL_COMMUNITY): Payer: Self-pay

## 2024-03-08 ENCOUNTER — Ambulatory Visit (HOSPITAL_COMMUNITY)
Admission: RE | Admit: 2024-03-08 | Discharge: 2024-03-08 | Disposition: A | Discharge status: Home / Self Care | Source: Ambulatory Visit | Attending: Oncology | Admitting: Oncology

## 2024-03-08 DIAGNOSIS — C884 Extranodal marginal zone b-cell lymphoma of mucosa-associated lymphoid tissue (malt-lymphoma) not having achieved remission: Secondary | ICD-10-CM | POA: Insufficient documentation

## 2024-03-08 DIAGNOSIS — I10 Essential (primary) hypertension: Secondary | ICD-10-CM | POA: Insufficient documentation

## 2024-03-08 DIAGNOSIS — Z79899 Other long term (current) drug therapy: Secondary | ICD-10-CM | POA: Insufficient documentation

## 2024-03-08 DIAGNOSIS — Z87891 Personal history of nicotine dependence: Secondary | ICD-10-CM | POA: Insufficient documentation

## 2024-03-08 MED ORDER — HEPARIN SOD (PORK) LOCK FLUSH 100 UNIT/ML IV SOLN
INTRAVENOUS | Status: AC
  Filled 2024-03-08: qty 5

## 2024-03-08 MED ORDER — LIDOCAINE-EPINEPHRINE 1 %-1:100000 IJ SOLN
INTRAMUSCULAR | Status: AC
  Filled 2024-03-08: qty 20

## 2024-03-08 MED ORDER — HEPARIN SOD (PORK) LOCK FLUSH 100 UNIT/ML IV SOLN
500.0000 [IU] | Freq: Once | INTRAVENOUS | Status: AC
  Administered 2024-03-08: 500 [IU] via INTRAVENOUS

## 2024-03-08 MED ORDER — LIDOCAINE-EPINEPHRINE 1 %-1:100000 IJ SOLN
20.0000 mL | Freq: Once | INTRAMUSCULAR | Status: AC
  Administered 2024-03-08: 15 mL via INTRADERMAL

## 2024-03-08 MED ORDER — MIDAZOLAM HCL 2 MG/2ML IJ SOLN
INTRAMUSCULAR | Status: AC
  Filled 2024-03-08: qty 2

## 2024-03-08 MED ORDER — MIDAZOLAM HCL (PF) 2 MG/2ML IJ SOLN
INTRAMUSCULAR | Status: AC | PRN
  Administered 2024-03-08: .5 mg via INTRAVENOUS
  Administered 2024-03-08: 1 mg via INTRAVENOUS

## 2024-03-08 MED ORDER — SODIUM CHLORIDE 0.9 % IV SOLN: INTRAVENOUS | Status: DC

## 2024-03-08 MED ORDER — FENTANYL CITRATE (PF) 100 MCG/2ML IJ SOLN
INTRAMUSCULAR | Status: AC
  Filled 2024-03-08: qty 2

## 2024-03-08 MED ORDER — FENTANYL CITRATE (PF) 100 MCG/2ML IJ SOLN
INTRAMUSCULAR | Status: AC | PRN
  Administered 2024-03-08: 25 ug via INTRAVENOUS
  Administered 2024-03-08: 50 ug via INTRAVENOUS

## 2024-03-08 NOTE — Consult Note (Signed)
 "     Chief Complaint: Patient was seen in consultation today for lymphoma  Referring Physician(s): Pasam,Avinash  Supervising Physician: Jenna Hacker  Patient Status: [facility] - Out-pt  History of Present Illness: Timothy Newton is a 64 y.o. male with history of hypertension, PUD, MALT lymphoma originally diagnosed in 2006, previously untreated, who was recently admitted to [facility] with shortness of breath, pleural effusion in the setting of presumed MALT lymphoma progression later confirmed 12/18/23 by bronch.  He now has plans for upcoming chemotherapy and is in need of durable venous access.     [name] presents today in his usual state of health. He has been NPO.  He does not take blood thinners. No new concerns or complaints. He does have family to provide post-procedure care and transportation. Next visit with cancer center is 2/10.   He is FULL CODE for procedural purposes.    Past Medical History:  Diagnosis Date   Anemia    Bleeding gastric ulcer    Blood transfusion    H. 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   s/p 14 days Nexium, clarithromycin and amoxicillin     Past Surgical History:  Procedure Laterality Date   BRONCHIAL NEEDLE ASPIRATION BIOPSY  12/18/2023   Procedure: BRONCHOSCOPY, WITH NEEDLE ASPIRATION BIOPSY;  Surgeon: Claudene Toribio BROCKS, MD;  Location: [facility] ENDOSCOPY;  Service: Pulmonary;;   HYDROCELE EXCISION Bilateral 12/27/2022   Procedure: BILATERAL HYDROCELECTOMY ADULT;  Surgeon: Alvaro Ricardo KATHEE Mickey., MD;  Location: [facility] ORS;  Service: Urology;  Laterality: Bilateral;   ORCHIOPEXY Bilateral 12/27/2022   Procedure: BILATERAL ORCHIOPEXY ADULT;  Surgeon: Alvaro Ricardo KATHEE Mickey., MD;  Location: [facility] ORS;  Service: Urology;  Laterality: Bilateral;  120 MINS FOR CASE   UPPER GASTROINTESTINAL ENDOSCOPY  2006, 2013   VIDEO BRONCHOSCOPY WITH ENDOBRONCHIAL ULTRASOUND Right  12/18/2023   Procedure: BRONCHOSCOPY, WITH EBUS;  Surgeon: Claudene Toribio BROCKS, MD;  Location: [facility] ENDOSCOPY;  Service: Pulmonary;  Laterality: Right;    Allergies: Rituximab   Medications: Prior to Admission medications  Medication Sig Start Date End Date Taking? Authorizing Provider  acetaminophen  (TYLENOL ) 325 MG tablet Take 2 tablets (650 mg total) by mouth every 6 (six) hours as needed for mild pain (pain score 1-3) or fever (or Fever >/= 101). 12/25/23  Yes Drusilla Sabas RAMAN, MD  lisinopril  (ZESTRIL ) 20 MG tablet Take 30 mg by mouth daily. 03/26/21  Yes [provider]  acyclovir  (ZOVIRAX ) 400 MG tablet Take 1 tablet (400 mg total) by mouth daily. 03/02/24   Pasam, Chinita, MD  albuterol  (VENTOLIN  HFA) 108 (90 Base) MCG/ACT inhaler Inhale 2 puffs into the lungs. 12/08/23 12/07/24  [provider]  allopurinol  (ZYLOPRIM ) 300 MG tablet Take 1 tablet (300 mg total) by mouth daily. 03/02/24   Pasam, Chinita, MD  dexamethasone  (DECADRON ) 4 MG tablet Take 2 tablets (8 mg total) by mouth daily. Start the day after bendamustine chemotherapy for 2 days. Take with food. 03/02/24   Pasam, Chinita, MD  lidocaine -prilocaine  (EMLA ) cream Apply to affected area once 03/02/24   Pasam, Avinash, MD  ondansetron  (ZOFRAN ) 8 MG tablet Take 1 tablet (8 mg total) by mouth every 8 (eight) hours as needed for nausea or vomiting. Start on the third day after chemotherapy. 03/02/24   Pasam, Chinita, MD  oxyCODONE -acetaminophen  (PERCOCET) 5-325 MG tablet Take 1 tablet by mouth every 6 (six) hours as needed. Patient not taking: Reported on 03/02/2024  11/09/23   Patt Alm Macho, MD  prochlorperazine  (COMPAZINE ) 10 MG tablet Take 1 tablet (10 mg total) by mouth every 6 (six) hours as needed for nausea or vomiting. 03/02/24   Pasam, Chinita, MD     Family History  Problem Relation Age of Onset   Colon cancer Neg Hx    Diabetes Neg Hx    Heart disease Neg Hx     Social History   Socioeconomic History   Marital  status: Married    Spouse name: Not on file   Number of children: 2   Years of education: Not on file   Highest education level: Not on file  Occupational History    Employer: [id number]  Tobacco Use   Smoking status: Former    Current packs/day: 0.00    Average packs/day: 1 pack/day for 5.0 years (5.0 ttl pk-yrs)    Types: Cigarettes    Start date: 03/18/1996    Quit date: 03/18/2001    Years since quitting: 22.9   Smokeless tobacco: Former  Building Services Engineer status: Never Used  Substance and Sexual Activity   Alcohol use: No   Drug use: No   Sexual activity: Yes  Other Topics Concern   Not on file  Social History Narrative   Not on file   Social Drivers of Health   Tobacco Use: Medium Risk (03/08/2024)   Patient History    Smoking Tobacco Use: Former    Smokeless Tobacco Use: Former    Passive Exposure: Not on Actuary Strain: Low Risk (04/03/2023)   Received from [facility]   Overall Financial Resource Strain (CARDIA)    Difficulty of Paying Living Expenses: Not hard at all  Food Insecurity: No Food Insecurity (12/30/2023)   Epic    Worried About Radiation Protection Practitioner of Food in the Last Year: Never true    Ran Out of Food in the Last Year: Never true  Transportation Needs: No Transportation Needs (12/30/2023)   Epic    Lack of Transportation (Medical): No    Lack of Transportation (Non-Medical): No  Physical Activity: Not on file  Stress: Not on file  Social Connections: Not on file  Depression (PHQ2-9): Low Risk (03/02/2024)   Depression (PHQ2-9)    PHQ-2 Score: 0  Alcohol Screen: Not on file  Housing: Low Risk (12/30/2023)   Epic    Unable to Pay for Housing in the Last Year: No    Number of Times Moved in the Last Year: 0    Homeless in the Last Year: No  Utilities: Not At Risk (12/30/2023)   Epic    Threatened with loss of utilities: No  Health Literacy: Not on file     Review of Systems: A 12 point ROS discussed and pertinent positives are  indicated in the HPI above.  All other systems are negative.  Review of Systems  Constitutional:  Negative for fatigue and fever.  Respiratory:  Negative for cough and shortness of breath.   Cardiovascular:  Negative for chest pain.  Gastrointestinal:  Negative for abdominal pain, nausea and vomiting.  Psychiatric/Behavioral:  Negative for behavioral problems and confusion.     Vital Signs: BP (!) 142/90 (BP Location: Left Arm)   Pulse 74   Temp 97.7 F (36.5 C) (Oral)   Resp 16   SpO2 95%   Physical Exam Vitals and nursing note reviewed.  Constitutional:      General: He is not in acute distress.  Appearance: Normal appearance. He is not ill-appearing.  HENT:     Mouth/Throat:     Mouth: Mucous membranes are moist.     Pharynx: Oropharynx is clear.  Cardiovascular:     Rate and Rhythm: Normal rate and regular rhythm.  Pulmonary:     Effort: Pulmonary effort is normal.     Breath sounds: Normal breath sounds.  Abdominal:     General: Abdomen is flat. There is no distension.     Palpations: Abdomen is soft.  Lymphadenopathy:     Cervical: Cervical adenopathy (mild) present.  Skin:    General: Skin is warm and dry.  Neurological:     General: No focal deficit present.     Mental Status: He is alert and oriented to person, place, and time. Mental status is at baseline.  Psychiatric:        Mood and Affect: Mood normal.        Behavior: Behavior normal.        Thought Content: Thought content normal.        Judgment: Judgment normal.      MD Evaluation Airway: WNL Heart: WNL Abdomen: WNL Chest/ Lungs: WNL ASA  Classification: 3 Mallampati/Airway Score: Two   Imaging: CT CHEST ABDOMEN PELVIS W CONTRAST Result Date: 02/28/2024 CLINICAL DATA:  Restaging malt lymphoma * Tracking Code: BO * EXAM: CT CHEST, ABDOMEN, AND PELVIS WITH CONTRAST TECHNIQUE: Multidetector CT imaging of the chest, abdomen and pelvis was performed following the standard protocol during  bolus administration of intravenous contrast. RADIATION DOSE REDUCTION: This exam was performed according to the departmental dose-optimization program which includes automated exposure control, adjustment of the mA and/or kV according to patient size and/or use of iterative reconstruction technique. CONTRAST:  OMNIPAQUE  IOHEXOL  300 MG/ML  SOLN COMPARISON:  CT chest, 01/08/2024, CT chest abdomen pelvis, 12/17/2023 FINDINGS: CT CHEST FINDINGS Cardiovascular: Scattered aortic atherosclerosis. Normal heart size. Left coronary artery calcifications. No pericardial effusion. Mediastinum/Nodes: Numerous enlarged mediastinal lymph nodes, unchanged, index pretracheal node measuring 2.3 x 1.4 cm (series 2, image 27). Thyroid gland, trachea, and esophagus demonstrate no significant findings. Lungs/Pleura: No significant change in appearance of the lungs, with very dense consolidations and air bronchograms seen in the apical right upper lobe (series 3, image 63), lateral right middle lobe (series 3, image 106), and to a lesser extent the superior segment right lower lobe and posterior left upper lobe (series 3, image 77). Additional scattered irregular consolidations in both lungs. Similar trace, loculated right pleural fluid. Musculoskeletal: No chest wall abnormality. No acute osseous findings. CT ABDOMEN PELVIS FINDINGS Hepatobiliary: No solid liver abnormality is seen. No gallstones, gallbladder wall thickening, or biliary dilatation. Pancreas: Unremarkable. No pancreatic ductal dilatation or surrounding inflammatory changes. Spleen: Normal in size without significant abnormality. Adrenals/Urinary Tract: Adrenal glands are unremarkable. Kidneys are normal, without renal calculi, solid lesion, or hydronephrosis. Bladder is unremarkable. Stomach/Bowel: Stomach is within normal limits. Appendix appears normal. No significant change in a circumferential mass of the terminal ileum measuring 3.3 x 3.3 x 5.7 cm (series 2,  image 105, series 4, image 63). No evidence of bowel obstruction. Pancolonic diverticulosis. Vascular/Lymphatic: No significant vascular findings are present. No enlarged abdominal or pelvic lymph nodes. Reproductive: Prostatomegaly. Other: Small fat containing supraumbilical hernia.  No ascites. Musculoskeletal: No acute osseous findings. IMPRESSION: 1. No significant change in appearance of the lungs, with very dense consolidations and air bronchograms seen in the apical right upper lobe, lateral right middle lobe, and to a lesser  extent the superior segment right lower lobe and posterior left upper lobe. Additional scattered irregular consolidations in both lungs. 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 2. Similar trace, loculated right pleural fluid. 3. Numerous enlarged mediastinal lymph nodes, unchanged. 4. No significant change in a circumferential mass of the terminal ileum measuring 3.3 x 3.3 x 5.7 cm, in keeping with MALT lymphoma. 5. Prostatomegaly. 6. Colonic diverticulosis. 7. Coronary artery disease. Aortic Atherosclerosis (ICD10-I70.0). Electronically Signed   By: Marolyn JONETTA Jaksch M.D.   On: 02/28/2024 06:25    Labs:  CBC: Recent Labs    12/30/23 0759 01/05/24 0825 01/13/24 0840 03/02/24 1410  WBC 8.2 6.0 7.5 6.1  HGB 12.1* 12.6* 12.8* 13.2  HCT 38.3* 40.1 40.5 41.2  PLT 398 308 212 190    COAGS: No results for input(s): INR, APTT in the last 8760 hours.  BMP: Recent Labs    12/30/23 0759 01/05/24 0825 01/13/24 0840 03/02/24 1410  NA 138 139 138 137  K 4.3 4.0 4.0 3.9  CL 102 102 101 101  CO2 27 28 26 23   GLUCOSE 103* 100* 105* 146*  BUN 13 12 12 14   CALCIUM 10.8* 10.8* 11.0* 11.2*  CREATININE 1.04 0.96 1.13 1.13  GFRNONAA >60 >60 >60 >60    LIVER FUNCTION TESTS: Recent Labs    12/30/23 0759 01/05/24 0825 01/13/24 0840 03/02/24 1410  BILITOT 0.7 0.8 1.3* 0.8  AST 24 17 17 21   ALT 35 24 15 17   ALKPHOS 61 69 76 72  PROT 8.4* 9.0*  9.0* 9.1*  ALBUMIN 3.3* 3.6 3.9 4.0    TUMOR MARKERS: No results for input(s): AFPTM, CEA, CA199, CHROMGRNA in the last 8760 hours.  Assessment and Plan: Lymphoma Patient with past medical history of MALT lymphoma presents in need of durable venous access for chemotherapy.  IR consulted for Port placement at the request of Dr. Autumn. Case reviewed by Dr. Jenna who approves patient for procedure.  Patient presents today in their usual state of health.  He has been NPO and is not currently on blood thinners.   Risks and benefits of image guided port-a-catheter placement was discussed with the patient including, but not limited to bleeding, infection, pneumothorax, or fibrin sheath development and need for additional procedures.  All of the patient's questions were answered, patient is agreeable to proceed. Consent signed and in chart.   Thank you for this interesting consult.  I greatly enjoyed meeting Hoyle Barkdull and look forward to participating in their care.  A copy of this report was sent to the requesting provider on this date.  Electronically Signed: Akila Batta Sue-Ellen Hitesh Fouche, PA 03/08/2024, 10:50 AM   I spent a total of   30 minutes in face to face in clinical consultation, greater than 50% of which was counseling/coordinating care for lymphoma.  "

## 2024-03-16 ENCOUNTER — Inpatient Hospital Stay

## 2024-03-16 ENCOUNTER — Inpatient Hospital Stay: Admitting: Oncology

## 2024-03-17 ENCOUNTER — Inpatient Hospital Stay

## 2024-04-13 ENCOUNTER — Inpatient Hospital Stay: Admitting: Oncology

## 2024-04-13 ENCOUNTER — Inpatient Hospital Stay

## 2024-04-14 ENCOUNTER — Inpatient Hospital Stay

## 2024-04-21 ENCOUNTER — Ambulatory Visit
